# Patient Record
Sex: Female | Born: 1960 | Race: White | Hispanic: No | Marital: Married | Smoking: Never smoker
Health system: Southern US, Community
[De-identification: ages and names within clinical notes are randomized; demographics above are authoritative.]

## PROBLEM LIST (undated history)

## (undated) DIAGNOSIS — R109 Unspecified abdominal pain: Secondary | ICD-10-CM

## (undated) DIAGNOSIS — Z9889 Other specified postprocedural states: Secondary | ICD-10-CM

## (undated) DIAGNOSIS — K589 Irritable bowel syndrome without diarrhea: Secondary | ICD-10-CM

## (undated) DIAGNOSIS — E785 Hyperlipidemia, unspecified: Secondary | ICD-10-CM

## (undated) DIAGNOSIS — E119 Type 2 diabetes mellitus without complications: Secondary | ICD-10-CM

## (undated) HISTORY — DX: Hyperlipidemia, unspecified: E78.5

## (undated) HISTORY — PX: CERVICAL DISC SURGERY: SHX588

## (undated) HISTORY — PX: CHOLECYSTECTOMY: SHX55

## (undated) HISTORY — PX: OTHER SURGICAL HISTORY: SHX169

## (undated) HISTORY — PX: KNEE SURGERY: SHX244

## (undated) HISTORY — DX: Type 2 diabetes mellitus without complications: E11.9

## (undated) HISTORY — DX: Unspecified abdominal pain: R10.9

## (undated) HISTORY — PX: ANKLE SURGERY: SHX546

## (undated) HISTORY — PX: CARDIAC CATHETERIZATION: SHX172

## (undated) HISTORY — PX: TUBAL LIGATION: SHX77

## (undated) HISTORY — DX: Irritable bowel syndrome, unspecified: K58.9

---

## 1998-11-11 ENCOUNTER — Emergency Department (HOSPITAL_COMMUNITY): Admission: EM | Admit: 1998-11-11 | Discharge: 1998-11-12 | Payer: Self-pay | Admitting: Emergency Medicine

## 1998-11-12 ENCOUNTER — Encounter: Payer: Self-pay | Admitting: Emergency Medicine

## 1998-12-28 ENCOUNTER — Ambulatory Visit (HOSPITAL_COMMUNITY): Admission: RE | Admit: 1998-12-28 | Discharge: 1998-12-28 | Payer: Self-pay | Admitting: *Deleted

## 1999-04-19 ENCOUNTER — Emergency Department (HOSPITAL_COMMUNITY): Admission: EM | Admit: 1999-04-19 | Discharge: 1999-04-19 | Payer: Self-pay | Admitting: Emergency Medicine

## 1999-08-16 ENCOUNTER — Encounter: Admission: RE | Admit: 1999-08-16 | Discharge: 1999-09-19 | Payer: Self-pay | Admitting: Occupational Medicine

## 1999-10-09 ENCOUNTER — Emergency Department (HOSPITAL_COMMUNITY): Admission: EM | Admit: 1999-10-09 | Discharge: 1999-10-09 | Payer: Self-pay | Admitting: Emergency Medicine

## 2000-02-05 ENCOUNTER — Emergency Department (HOSPITAL_COMMUNITY): Admission: EM | Admit: 2000-02-05 | Discharge: 2000-02-05 | Payer: Self-pay | Admitting: *Deleted

## 2000-10-19 ENCOUNTER — Encounter: Payer: Self-pay | Admitting: Internal Medicine

## 2000-10-19 ENCOUNTER — Ambulatory Visit (HOSPITAL_COMMUNITY): Admission: RE | Admit: 2000-10-19 | Discharge: 2000-10-19 | Payer: Self-pay | Admitting: Internal Medicine

## 2000-10-23 ENCOUNTER — Encounter: Payer: Self-pay | Admitting: Internal Medicine

## 2000-10-23 ENCOUNTER — Ambulatory Visit (HOSPITAL_COMMUNITY): Admission: RE | Admit: 2000-10-23 | Discharge: 2000-10-23 | Payer: Self-pay | Admitting: Internal Medicine

## 2000-10-30 ENCOUNTER — Encounter: Admission: RE | Admit: 2000-10-30 | Discharge: 2000-12-14 | Payer: Self-pay | Admitting: Internal Medicine

## 2000-12-28 ENCOUNTER — Ambulatory Visit (HOSPITAL_COMMUNITY): Admission: RE | Admit: 2000-12-28 | Discharge: 2000-12-28 | Payer: Self-pay | Admitting: Neurosurgery

## 2000-12-28 ENCOUNTER — Encounter: Payer: Self-pay | Admitting: Neurosurgery

## 2001-04-02 ENCOUNTER — Ambulatory Visit (HOSPITAL_COMMUNITY): Admission: RE | Admit: 2001-04-02 | Discharge: 2001-04-02 | Payer: Self-pay | Admitting: Neurosurgery

## 2001-04-02 ENCOUNTER — Encounter: Payer: Self-pay | Admitting: Neurosurgery

## 2001-08-24 ENCOUNTER — Encounter: Payer: Self-pay | Admitting: Internal Medicine

## 2001-08-24 ENCOUNTER — Ambulatory Visit (HOSPITAL_COMMUNITY): Admission: RE | Admit: 2001-08-24 | Discharge: 2001-08-24 | Payer: Self-pay | Admitting: Internal Medicine

## 2001-11-04 ENCOUNTER — Ambulatory Visit (HOSPITAL_COMMUNITY): Admission: RE | Admit: 2001-11-04 | Discharge: 2001-11-04 | Payer: Self-pay | Admitting: Neurological Surgery

## 2001-11-04 ENCOUNTER — Encounter: Payer: Self-pay | Admitting: Neurological Surgery

## 2001-12-09 ENCOUNTER — Other Ambulatory Visit: Admission: RE | Admit: 2001-12-09 | Discharge: 2001-12-09 | Payer: Self-pay | Admitting: Unknown Physician Specialty

## 2001-12-11 ENCOUNTER — Emergency Department (HOSPITAL_COMMUNITY): Admission: EM | Admit: 2001-12-11 | Discharge: 2001-12-12 | Payer: Self-pay | Admitting: *Deleted

## 2002-03-02 ENCOUNTER — Encounter: Payer: Self-pay | Admitting: Neurological Surgery

## 2002-03-03 ENCOUNTER — Ambulatory Visit (HOSPITAL_COMMUNITY): Admission: RE | Admit: 2002-03-03 | Discharge: 2002-03-04 | Payer: Self-pay | Admitting: Neurological Surgery

## 2002-03-03 ENCOUNTER — Encounter: Payer: Self-pay | Admitting: Neurological Surgery

## 2002-04-28 ENCOUNTER — Encounter: Admission: RE | Admit: 2002-04-28 | Discharge: 2002-07-05 | Payer: Self-pay | Admitting: Neurological Surgery

## 2002-06-16 ENCOUNTER — Encounter: Admission: RE | Admit: 2002-06-16 | Discharge: 2002-06-16 | Payer: Self-pay | Admitting: Occupational Medicine

## 2002-06-16 ENCOUNTER — Encounter: Payer: Self-pay | Admitting: Occupational Medicine

## 2002-07-13 ENCOUNTER — Encounter: Admission: RE | Admit: 2002-07-13 | Discharge: 2002-09-26 | Payer: Self-pay | Admitting: Occupational Medicine

## 2002-08-08 ENCOUNTER — Encounter: Admission: RE | Admit: 2002-08-08 | Discharge: 2002-08-08 | Payer: Self-pay | Admitting: Occupational Medicine

## 2002-08-08 ENCOUNTER — Encounter: Payer: Self-pay | Admitting: Occupational Medicine

## 2002-09-23 ENCOUNTER — Ambulatory Visit (HOSPITAL_BASED_OUTPATIENT_CLINIC_OR_DEPARTMENT_OTHER): Admission: RE | Admit: 2002-09-23 | Discharge: 2002-09-24 | Payer: Self-pay | Admitting: Orthopedic Surgery

## 2002-10-31 ENCOUNTER — Encounter: Admission: RE | Admit: 2002-10-31 | Discharge: 2002-12-27 | Payer: Self-pay | Admitting: Orthopedic Surgery

## 2002-12-23 ENCOUNTER — Ambulatory Visit (HOSPITAL_BASED_OUTPATIENT_CLINIC_OR_DEPARTMENT_OTHER): Admission: RE | Admit: 2002-12-23 | Discharge: 2002-12-23 | Payer: Self-pay | Admitting: Orthopedic Surgery

## 2003-01-03 ENCOUNTER — Encounter: Admission: RE | Admit: 2003-01-03 | Discharge: 2003-02-16 | Payer: Self-pay | Admitting: Orthopedic Surgery

## 2003-01-30 ENCOUNTER — Emergency Department (HOSPITAL_COMMUNITY): Admission: EM | Admit: 2003-01-30 | Discharge: 2003-01-30 | Payer: Self-pay | Admitting: Emergency Medicine

## 2003-01-31 ENCOUNTER — Encounter: Payer: Self-pay | Admitting: Emergency Medicine

## 2003-01-31 ENCOUNTER — Ambulatory Visit (HOSPITAL_COMMUNITY): Admission: RE | Admit: 2003-01-31 | Discharge: 2003-01-31 | Payer: Self-pay | Admitting: Emergency Medicine

## 2003-05-04 ENCOUNTER — Encounter: Payer: Self-pay | Admitting: Internal Medicine

## 2003-05-04 ENCOUNTER — Ambulatory Visit (HOSPITAL_COMMUNITY): Admission: RE | Admit: 2003-05-04 | Discharge: 2003-05-04 | Payer: Self-pay | Admitting: Internal Medicine

## 2003-10-17 ENCOUNTER — Encounter: Admission: RE | Admit: 2003-10-17 | Discharge: 2004-01-15 | Payer: Self-pay | Admitting: Orthopedic Surgery

## 2003-11-06 ENCOUNTER — Ambulatory Visit (HOSPITAL_COMMUNITY): Admission: RE | Admit: 2003-11-06 | Discharge: 2003-11-06 | Payer: Self-pay | Admitting: Orthopedic Surgery

## 2004-01-23 ENCOUNTER — Emergency Department (HOSPITAL_COMMUNITY): Admission: EM | Admit: 2004-01-23 | Discharge: 2004-01-23 | Payer: Self-pay | Admitting: Emergency Medicine

## 2004-02-02 ENCOUNTER — Encounter: Admission: RE | Admit: 2004-02-02 | Discharge: 2004-05-02 | Payer: Self-pay | Admitting: Orthopedic Surgery

## 2005-04-22 ENCOUNTER — Emergency Department (HOSPITAL_COMMUNITY): Admission: EM | Admit: 2005-04-22 | Discharge: 2005-04-22 | Payer: Self-pay | Admitting: Emergency Medicine

## 2007-05-19 ENCOUNTER — Encounter: Admission: RE | Admit: 2007-05-19 | Discharge: 2007-05-19 | Payer: Self-pay | Admitting: Neurological Surgery

## 2007-09-21 ENCOUNTER — Encounter: Admission: RE | Admit: 2007-09-21 | Discharge: 2007-10-19 | Payer: Self-pay | Admitting: Orthopedic Surgery

## 2007-10-06 ENCOUNTER — Ambulatory Visit: Payer: Self-pay | Admitting: Vascular Surgery

## 2007-10-06 ENCOUNTER — Encounter (INDEPENDENT_AMBULATORY_CARE_PROVIDER_SITE_OTHER): Payer: Self-pay | Admitting: Orthopedic Surgery

## 2007-10-06 ENCOUNTER — Ambulatory Visit (HOSPITAL_COMMUNITY): Admission: RE | Admit: 2007-10-06 | Discharge: 2007-10-06 | Payer: Self-pay | Admitting: Orthopedic Surgery

## 2007-11-02 ENCOUNTER — Ambulatory Visit: Admission: RE | Admit: 2007-11-02 | Discharge: 2007-11-02 | Payer: Self-pay | Admitting: Family Medicine

## 2007-11-02 ENCOUNTER — Ambulatory Visit: Payer: Self-pay | Admitting: Vascular Surgery

## 2007-11-02 ENCOUNTER — Encounter: Payer: Self-pay | Admitting: Family Medicine

## 2007-12-22 ENCOUNTER — Ambulatory Visit: Payer: Self-pay | Admitting: Cardiology

## 2008-01-03 ENCOUNTER — Ambulatory Visit: Payer: Self-pay

## 2008-06-21 ENCOUNTER — Encounter: Admission: RE | Admit: 2008-06-21 | Discharge: 2008-06-21 | Payer: Self-pay | Admitting: Orthopedic Surgery

## 2008-07-01 ENCOUNTER — Encounter: Admission: RE | Admit: 2008-07-01 | Discharge: 2008-07-01 | Payer: Self-pay | Admitting: Orthopedic Surgery

## 2008-09-12 ENCOUNTER — Encounter: Admission: RE | Admit: 2008-09-12 | Discharge: 2008-09-12 | Payer: Self-pay | Admitting: Family Medicine

## 2009-09-10 ENCOUNTER — Emergency Department (HOSPITAL_COMMUNITY): Admission: EM | Admit: 2009-09-10 | Discharge: 2009-09-10 | Payer: Self-pay | Admitting: Emergency Medicine

## 2010-12-04 ENCOUNTER — Other Ambulatory Visit: Payer: Self-pay | Admitting: Neurological Surgery

## 2010-12-04 DIAGNOSIS — M542 Cervicalgia: Secondary | ICD-10-CM

## 2010-12-13 ENCOUNTER — Ambulatory Visit
Admission: RE | Admit: 2010-12-13 | Discharge: 2010-12-13 | Disposition: A | Discharge status: Home / Self Care | Payer: Medicare Other | Source: Ambulatory Visit | Attending: Neurological Surgery | Admitting: Neurological Surgery

## 2010-12-13 DIAGNOSIS — M542 Cervicalgia: Secondary | ICD-10-CM

## 2010-12-17 NOTE — Assessment & Plan Note (Signed)
[facility]                            CARDIOLOGY OFFICE NOTE   MAHITHA, HICKLING                     MRN:          [id number]  DATE:12/22/2007                            DOB:          1960/08/19    PRIMARY CARE PHYSICIAN:  Lindaann Pascal, PA, [facility]   REASON PRESENTATION:  Evaluate patient with chest discomfort.   HISTORY OF PRESENT ILLNESS:  The patient is a 50 year old white female  with past cardiac catheterization in [place] in 1995.  She had a  stress test for evaluation of chest pain.  I do not know the results,  but it must have been abnormal as she subsequently had a cardiac  catheterization.  She was told she had clean coronaries.  However, she  had recurrent chest discomfort 2 weeks ago.  She describes episodes of  discomfort, particularly with stress such as emotional stress.  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  It is substernal.  It may be 3-4/10 in  intensity.  The most was 7/10.  It only lasts for 30 seconds usually.  She has had it up to a minute.  It goes away when she calms down or when  she stops what she is doing.  It is somewhat sharp.  It is a little  tender to touch.  She thinks it is similar to the previous discomfort  she had.  It does not happen every day, particularly not if she is  emotionally or physically stressed.  She has been getting more short of  breath for the last 2 weeks.  She notices this with activity, but is not  describing any resting shortness of breath and has had no PND or  orthopnea.  When she get the above discomfort, she has no associated  nausea, vomiting, or diaphoresis.  There is no radiation.  She did have  an EKG that demonstrated no acute findings.   PAST MEDICAL HISTORY:  She denies any history of hypertension, diabetes  or hyperlipidemia.   PAST SURGICAL HISTORY:  1. Left knee  surgery. Arthroscopic.  2. Cervical diskectomy.   ALLERGIES:  None.   MEDICATIONS:  Vitamin D.   SOCIAL HISTORY:  The patient is a housewife.  She is married.  She has 4  children.  She is undergoing a lot of stress.  She does not smoke  cigarettes and never has. She does not drink alcohol.   FAMILY HISTORY:  Contributory for a brother dying suddenly of a  myocardial infarction at age 63.   REVIEW OF SYSTEMS:  As stated in the HPI, positive for occasional  palpitations.  Negative for all other systems.   PHYSICAL EXAMINATION:  GENERAL:  The patient is in no acute distress.  VITAL SIGNS:  Blood pressure 124/78, heart rate 99 and regular, weight  295 pounds, body mass index 43.  HEENT:  Eyelids are unremarkable, pupils equal, round, and react to  light, fundi within normal limits, oral mucosa unremarkable.  NECK:  No jugular  distention at 45 degrees, carotid upstroke brisk and  symmetrical.  No bruits, no thyromegaly.  LYMPHATICS:  No cervical, axillary, or inguinal adenopathy.  LUNGS:  Clear to auscultation bilaterally.  BACK:  No costovertebral angle tenderness.  CHEST:  Unremarkable.  HEART:  PMI not displaced or sustained, S1 and S2 within normal limits,  no S3, no S4.  No clicks, no rubs, no murmurs.  ABDOMEN:  Morbidly obese, positive bowel sounds, normal in frequency and  pitch, no bruits, no rebound, no guarding, no midline pulsatile mass.  No hepatomegaly, no splenomegaly.  SKIN:  No rashes, no nodules.  EXTREMITIES:  2+ pulses throughout, no edema, no cyanosis, no clubbing.  NEURO:  Oriented to person,  place, time, cranial nerves II-XII grossly  intact, motor grossly intact.   EKG sinus rhythm, rate 85, axis within normal limits, intervals within  normal limits, no acute ST-T wave changes.   ASSESSMENT/PLAN:  1. Chest:  The patient's chest comfort is worrisome for new onset      exertional angina.  She has a very significant family history for      obstructive  coronary disease.  I think the pretest probability of      obstructive coronary disease is at least moderate to moderately      high.  Therefore, I think stress testing is indicated.  The patient      might be able to walk on a treadmill.  We will do perfusion      imaging.  If she cannot ambulate to a target heart rate, she can be      converted to adenosine.  2. Obesity:  She understands that she needs to lose weight with diet      and exercise and we can review this further.  3. Risk reduction:  She needs a lipid profile.  I will defer to her      primary caregivers.  4. Low vitamin D.  This was recently diagnosed and she is getting this      supplemented.  5. Followup will be as needed based on results of the above testing.     Rollene Rotunda, MD, [facility]  Electronically Signed    JH/MedQ  DD: 12/22/2007  DT: 12/22/2007  Job #: [id number]   cc:   Lorin Picket, PA Long

## 2010-12-20 NOTE — Op Note (Signed)
   NAME:  Tina Sampson, Tina Sampson                        ACCOUNT NO.:  [id number]   MEDICAL RECORD NO.:  [id number]                   PATIENT TYPE:  AMB   LOCATION:  DSC                                  FACILITY:  [facility]   PHYSICIAN:  Thera Flake., M.D.             DATE OF BIRTH:  13-Jun-1961   DATE OF PROCEDURE:  09/23/2002  DATE OF DISCHARGE:                                 OPERATIVE REPORT   PREOPERATIVE DIAGNOSIS:  Unstable osteochondritic lesion, medial talar dome.   POSTOPERATIVE DIAGNOSIS:  Unstable osteochondritic lesion, medial talar  dome.   PROCEDURES:  1. Debridement with drilling of osteochondritis.  2. Synovectomy.   SURGEON:  Dyke Brackett, M.D.   ASSISTANT:  Jamelle Rushing, P.A.   ANESTHESIA:  General.   TOURNIQUET TIME:  Approximately 1 hour 10 minutes.   INDICATIONS:  A 50 year old, MRI-proven osteochondritis of her medial aspect  of her talar dome with persistent pain and catching, not responding to  conservative treatment.   DESCRIPTION OF PROCEDURE:  The patient was placed in a leg holder in supine  position on the _____.  She was arthroscoped through an anterior, medial,  and lateral portal.  Systematic inspection of the ankle showed moderate  synovitis along the anterior and medial gutter, which was debrided.  The  lateral gutter by and large was intact.  There was an unstable lesion of the  medial talar dome, not amenable to fixation.  It was debrided free of any  loose articular cartilage and then using the microfracture awls, several  trephine holes made in the lesion.  The tip of the awl, despite great care  being made to use the instrument as indicated, did crack off in the joint;  however, it was retrieved and documented with OEC as well as retrieving the  piece of metal outside of the ankle that this was removed.  This created no  undue problems relative to the procedure.  At the end the lesion was  essentially drilled.  A lightly compressive  sterile dressing was applied  after the portals were closed with nylon.  The portals were infiltrated with  Marcaine with the addition of about 10 mL into the joint.  Placed in a  posterior plaster splint.  Taken to the recovery room in stable condition.                                               Thera Flake., M.D.    WDC/MEDQ  D:  09/23/2002  T:  09/23/2002  Job:  [id number]
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
## 2010-12-20 NOTE — Op Note (Signed)
   NAME:  Tina Sampson, Tina Sampson                        ACCOUNT NO.:  [id number]   MEDICAL RECORD NO.:  [id number]                   PATIENT TYPE:  AMB   LOCATION:  DSC                                  FACILITY:  [facility]   PHYSICIAN:  Thera Flake., M.D.             DATE OF BIRTH:  Feb 28, 1961   DATE OF PROCEDURE:  DATE OF DISCHARGE:                                 OPERATIVE REPORT   INDICATIONS:  A 50 year old with left knee pain after a knee injury, work  related, not responding to conservative treatment, thought to be amenable to  outpatient arthroscopy.   PREOPERATIVE DIAGNOSES:  1. Torn anterior horn lateral meniscus left knee.  2. Chondromalacia of the patellofemoral joint.  3. 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   POSTOPERATIVE DIAGNOSES:  1. Torn anterior horn lateral meniscus left knee.  2. Chondromalacia of the patellofemoral joint.  3. 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   PROCEDURE:  1. Partial lateral meniscectomy.  2. Debridement and chondroplasty of the patellofemoral joint medial     compartment.   SURGEON:  Dyke Brackett, M.D.   ANESTHESIA:  General.   DESCRIPTION OF PROCEDURE:  After introducing the scope through an  inferomedial and through a lateral portal, systematic inspection of the knee  showed the patient to have grade 3 chondromalacia of the medial femoral  condyle and less severe changes on the tibia.  The anterior horn and lateral  meniscus was torn and was debrided.  There were early degenerative changes  on the lateral.  There was a chondral injury to the medial femoral condyle.  It was debrided; patellofemoral changes, grade 3, particularly on the  patella which were debrided.   Debridement of the patellofemoral joint, medial compartment was carried out  separate from the lateral meniscectomy; lateral meniscus approximately 10-  15% of the meniscus was saved.  There were no grade 4 changes appreciated.  The knee  drained clear fluid.  Ports were closed with Nylon.  Knee  infiltrated Marcaine and morphine with addition of 40 mg of Depo-Medrol into  the joint for a knee block with Marcaine, and the additional total 30 cc of  1/2% Marcaine was used.  Taken to the recovery room in stable condition.                                               Thera Flake., M.D.    WDC/MEDQ  D:  12/23/2002  T:  12/24/2002  Job:  [id number]

## 2010-12-23 ENCOUNTER — Other Ambulatory Visit: Payer: Self-pay | Admitting: Neurological Surgery

## 2010-12-23 DIAGNOSIS — M542 Cervicalgia: Secondary | ICD-10-CM

## 2010-12-27 ENCOUNTER — Ambulatory Visit
Admission: RE | Admit: 2010-12-27 | Discharge: 2010-12-27 | Disposition: A | Discharge status: Home / Self Care | Payer: Medicare Other | Source: Ambulatory Visit | Attending: Neurological Surgery | Admitting: Neurological Surgery

## 2010-12-27 DIAGNOSIS — M542 Cervicalgia: Secondary | ICD-10-CM

## 2010-12-31 ENCOUNTER — Ambulatory Visit: Payer: Medicare PPO | Attending: Neurological Surgery | Admitting: Physical Therapy

## 2010-12-31 DIAGNOSIS — M542 Cervicalgia: Secondary | ICD-10-CM | POA: Insufficient documentation

## 2010-12-31 DIAGNOSIS — IMO0001 Reserved for inherently not codable concepts without codable children: Secondary | ICD-10-CM | POA: Insufficient documentation

## 2010-12-31 DIAGNOSIS — R5381 Other malaise: Secondary | ICD-10-CM | POA: Insufficient documentation

## 2011-01-02 ENCOUNTER — Ambulatory Visit: Payer: Medicare PPO | Admitting: Physical Therapy

## 2011-01-06 ENCOUNTER — Encounter: Payer: Medicare Other | Admitting: Physical Therapy

## 2011-01-09 ENCOUNTER — Ambulatory Visit: Payer: Medicare PPO | Attending: Neurological Surgery | Admitting: Physical Therapy

## 2011-01-09 DIAGNOSIS — IMO0001 Reserved for inherently not codable concepts without codable children: Secondary | ICD-10-CM | POA: Insufficient documentation

## 2011-01-09 DIAGNOSIS — M542 Cervicalgia: Secondary | ICD-10-CM | POA: Insufficient documentation

## 2011-01-09 DIAGNOSIS — R5381 Other malaise: Secondary | ICD-10-CM | POA: Insufficient documentation

## 2011-01-13 ENCOUNTER — Ambulatory Visit: Payer: Medicare PPO

## 2011-01-16 ENCOUNTER — Encounter: Payer: Medicare Other | Admitting: Physical Therapy

## 2011-01-20 ENCOUNTER — Ambulatory Visit: Payer: Medicare PPO | Admitting: Physical Therapy

## 2011-01-23 ENCOUNTER — Ambulatory Visit: Payer: Medicare PPO | Admitting: Physical Therapy

## 2011-01-28 ENCOUNTER — Ambulatory Visit: Payer: Medicare PPO | Admitting: Physical Therapy

## 2011-05-15 ENCOUNTER — Encounter: Payer: Self-pay | Admitting: *Deleted

## 2011-05-15 ENCOUNTER — Emergency Department (HOSPITAL_COMMUNITY)
Admission: EM | Admit: 2011-05-15 | Discharge: 2011-05-15 | Disposition: A | Discharge status: Home / Self Care | Payer: Medicare PPO | Attending: Emergency Medicine | Admitting: Emergency Medicine

## 2011-05-15 ENCOUNTER — Emergency Department (HOSPITAL_COMMUNITY): Payer: Medicare PPO

## 2011-05-15 DIAGNOSIS — S4980XA Other specified injuries of shoulder and upper arm, unspecified arm, initial encounter: Secondary | ICD-10-CM | POA: Insufficient documentation

## 2011-05-15 DIAGNOSIS — X500XXA Overexertion from strenuous movement or load, initial encounter: Secondary | ICD-10-CM | POA: Insufficient documentation

## 2011-05-15 DIAGNOSIS — S46009A Unspecified injury of muscle(s) and tendon(s) of the rotator cuff of unspecified shoulder, initial encounter: Secondary | ICD-10-CM

## 2011-05-15 DIAGNOSIS — M79609 Pain in unspecified limb: Secondary | ICD-10-CM | POA: Insufficient documentation

## 2011-05-15 DIAGNOSIS — S46909A Unspecified injury of unspecified muscle, fascia and tendon at shoulder and upper arm level, unspecified arm, initial encounter: Secondary | ICD-10-CM | POA: Insufficient documentation

## 2011-05-15 DIAGNOSIS — M542 Cervicalgia: Secondary | ICD-10-CM | POA: Insufficient documentation

## 2011-05-15 DIAGNOSIS — E119 Type 2 diabetes mellitus without complications: Secondary | ICD-10-CM | POA: Insufficient documentation

## 2011-05-15 MED ORDER — NAPROXEN 500 MG PO TABS: 500.0000 mg | ORAL_TABLET | Freq: Two times a day (BID) | ORAL | Status: AC

## 2011-05-15 MED ORDER — HYDROCODONE-ACETAMINOPHEN 5-325 MG PO TABS: 1.0000 | ORAL_TABLET | ORAL | Status: AC | PRN

## 2011-05-15 MED ORDER — HYDROCODONE-ACETAMINOPHEN 5-325 MG PO TABS
1.0000 | ORAL_TABLET | Freq: Once | ORAL | Status: AC
  Administered 2011-05-15: 1 via ORAL
  Filled 2011-05-15: qty 1
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
## 2011-05-15 NOTE — ED Notes (Signed)
Helping to lift a lawn mower,  Shifter and twisted rt arm, pain  Entire arm and shoulder. With numbness

## 2012-06-13 ENCOUNTER — Emergency Department (HOSPITAL_COMMUNITY): Payer: Medicare PPO

## 2012-06-13 ENCOUNTER — Emergency Department (HOSPITAL_COMMUNITY)
Admission: EM | Admit: 2012-06-13 | Discharge: 2012-06-13 | Disposition: A | Discharge status: Home / Self Care | Payer: Medicare PPO | Attending: Emergency Medicine | Admitting: Emergency Medicine

## 2012-06-13 ENCOUNTER — Encounter (HOSPITAL_COMMUNITY): Payer: Self-pay | Admitting: Emergency Medicine

## 2012-06-13 DIAGNOSIS — S63501A Unspecified sprain of right wrist, initial encounter: Secondary | ICD-10-CM

## 2012-06-13 DIAGNOSIS — S63509A Unspecified sprain of unspecified wrist, initial encounter: Secondary | ICD-10-CM | POA: Insufficient documentation

## 2012-06-13 DIAGNOSIS — E119 Type 2 diabetes mellitus without complications: Secondary | ICD-10-CM | POA: Insufficient documentation

## 2012-06-13 DIAGNOSIS — Y939 Activity, unspecified: Secondary | ICD-10-CM | POA: Insufficient documentation

## 2012-06-13 DIAGNOSIS — W010XXA Fall on same level from slipping, tripping and stumbling without subsequent striking against object, initial encounter: Secondary | ICD-10-CM | POA: Insufficient documentation

## 2012-06-13 DIAGNOSIS — Y92009 Unspecified place in unspecified non-institutional (private) residence as the place of occurrence of the external cause: Secondary | ICD-10-CM | POA: Insufficient documentation

## 2012-06-13 DIAGNOSIS — S20219A Contusion of unspecified front wall of thorax, initial encounter: Secondary | ICD-10-CM

## 2012-06-13 HISTORY — DX: Other specified postprocedural states: Z98.890

## 2012-06-13 MED ORDER — CYCLOBENZAPRINE HCL 10 MG PO TABS
10.0000 mg | ORAL_TABLET | Freq: Three times a day (TID) | ORAL | Status: DC | PRN
Start: 2012-06-13 — End: 2017-06-10

## 2012-06-13 MED ORDER — OXYCODONE-ACETAMINOPHEN 5-325 MG PO TABS
1.0000 | ORAL_TABLET | Freq: Once | ORAL | Status: AC
  Administered 2012-06-13: 1 via ORAL
  Filled 2012-06-13: qty 1

## 2012-06-13 MED ORDER — OXYCODONE-ACETAMINOPHEN 5-325 MG PO TABS: 1.0000 | ORAL_TABLET | ORAL | Status: AC | PRN

## 2012-06-13 NOTE — ED Notes (Signed)
Patient with c/o entire right side pain from fall. Patient states she tripped and landed on her right side. Pain increases with respirations.

## 2012-06-13 NOTE — ED Provider Notes (Signed)
History     CSN: [id number]  Arrival date & time 06/13/12  1750   First MD Initiated Contact with Patient 06/13/12 1928      Chief Complaint  Patient presents with  . Fall    (Consider location/radiation/quality/duration/timing/severity/associated sxs/prior treatment) HPI Comments: Patient c/o pain to her right lateral ribs and right wrist after she tripped and fell inside her home.  States pain is worse with palpation and certain movements, improves with rest.  Wrist pain is worse with gripping and flexion.  She denies abd pain, shortness of breath, diaphoresis, neck or low back pain.  Also denies head injury , headaches, or LOC  Patient is a 51 y.o. female presenting with chest pain. The history is provided by the patient.  Chest Pain Episode onset: several hrs PTA. Chest pain occurs constantly. The chest pain is unchanged. The pain is associated with breathing (movemetn of right arm, palpation of the latral chest wall). The severity of the pain is moderate. The quality of the pain is described as aching. The pain does not radiate. Chest pain is worsened by certain positions and deep breathing. Pertinent negatives for primary symptoms include no fever, no fatigue, no syncope, no shortness of breath, no cough, no wheezing, no palpitations, no abdominal pain, no nausea, no vomiting, no dizziness and no altered mental status.  Pertinent negatives for associated symptoms include no numbness and no weakness. She tried nothing for the symptoms.     Past Medical History  Diagnosis Date  . Diabetes mellitus   . H/O knee surgery     Past Surgical History  Procedure Date  . Cervical disc surgery   . Cholecystectomy   . Cesarean section   . Knee surgery   . Catherization   . Knee surgery   . Tubal ligation   . Ankle surgery     Family History  Problem Relation Age of Onset  . Cancer Mother   . Heart failure Mother   . Hyperlipidemia Mother   . Diabetes Mother   .  Osteoarthritis Sister   . Heart failure Brother     History  Substance Use Topics  . Smoking status: Never Smoker   . Smokeless tobacco: Not on file  . Alcohol Use: No    OB History    Grav Para Term Preterm Abortions TAB SAB Ect Mult Living                  Review of Systems  Constitutional: Negative for fever, chills and fatigue.  HENT: Negative for neck pain.   Eyes: Negative for visual disturbance.  Respiratory: Negative for cough, chest tightness, shortness of breath and wheezing.   Cardiovascular: Positive for chest pain. Negative for palpitations and syncope.  Gastrointestinal: Negative for nausea, vomiting and abdominal pain.  Genitourinary: Negative for dysuria, hematuria, flank pain and difficulty urinating.  Musculoskeletal: Positive for arthralgias. Negative for back pain, joint swelling and gait problem.  Skin: Negative for color change and wound.  Neurological: Negative for dizziness, syncope, weakness, numbness and headaches.  Psychiatric/Behavioral: Negative for confusion and altered mental status.  All other systems reviewed and are negative.    Allergies  Bee venom  Home Medications   Current Outpatient Rx  Name  Route  Sig  Dispense  Refill  . DULOXETINE HCL 60 MG PO CPEP   Oral   Take 60 mg by mouth at bedtime.           Marland Kitchen GABAPENTIN 300 MG PO  CAPS   Oral   Take 600 mg by mouth 2 (two) times daily. Take one capsule every morning and evening, then take two capsules at bedtime         . METFORMIN HCL 500 MG PO TABS   Oral   Take 500 mg by mouth 2 (two) times daily with a meal.           . NAPROXEN 500 MG PO TABS   Oral   Take 500 mg by mouth 2 (two) times daily.         Marland Kitchen PRAMIPEXOLE DIHYDROCHLORIDE 0.125 MG PO TABS   Oral   Take 0.125 mg by mouth at bedtime. For RLS         . PRAVASTATIN SODIUM 40 MG PO TABS   Oral   Take 40 mg by mouth daily.         . TRAZODONE HCL 50 MG PO TABS   Oral   Take 50 mg by mouth at  bedtime.           BP 107/67  Pulse 81  Temp 98.3 F (36.8 C) (Oral)  Resp 19  Ht 5\' 9"  (1.753 m)  Wt 275 lb (124.739 kg)  BMI 40.61 kg/m2  SpO2 97%  LMP 03/19/2011  Physical Exam  Nursing note and vitals reviewed. Constitutional: She is oriented to person, place, and time. She appears well-developed and well-nourished. No distress.       Pt is well appearing, obese  HENT:  Head: Normocephalic and atraumatic.  Eyes: EOM are normal. Pupils are equal, round, and reactive to light.  Neck: Normal range of motion and phonation normal. Neck supple. No spinous process tenderness and no muscular tenderness present. Normal range of motion present.  Cardiovascular: Normal rate, regular rhythm, normal heart sounds and intact distal pulses.   No murmur heard. Pulmonary/Chest: Effort normal and breath sounds normal. No respiratory distress. She has no decreased breath sounds. She has no wheezes. She has no rales. She exhibits tenderness.         ttp of the lateral right chest wall.  No crepitus, abrasions of bruising.  Abdominal: Soft. She exhibits no distension and no mass. There is no tenderness. There is no rebound and no guarding.  Musculoskeletal: She exhibits tenderness. She exhibits no edema.       Right wrist: She exhibits tenderness and bony tenderness. She exhibits normal range of motion, no swelling, no effusion, no crepitus, no deformity and no laceration.       Arms:      right wrist is ttp .  Radial pulse is brisk, sensation intact.  CR< 2 sec.  No bruising or deformity.  Patient has full ROM.  Lymphadenopathy:    She has no cervical adenopathy.  Neurological: She is alert and oriented to person, place, and time. She exhibits normal muscle tone. Coordination normal.  Skin: Skin is warm and dry.    ED Course  Procedures (including critical care time)  Labs Reviewed - No data to display Dg Ribs Unilateral W/chest Right  06/13/2012  *RADIOLOGY REPORT*  Clinical Data:  Status post fall.  RIGHT RIBS AND CHEST - 3+ VIEW  Comparison: None  Findings: Normal heart size.  No pleural effusion or edema.  No airspace consolidation identified.  Dedicated views of the right ribs show no displaced rib fracture.  IMPRESSION: No acute findings.   Original Report Authenticated By: Signa Kell, M.D.    Dg Wrist Complete Right  06/13/2012  *  RADIOLOGY REPORT*  Clinical Data: Fall.  Pain over ulnar styloid.  RIGHT WRIST - COMPLETE 3+ VIEW  Comparison: None.  Findings: No fracture, foreign body, or acute bony findings are identified.  IMPRESSION:  No significant abnormality identified.   Original Report Authenticated By: Gaylyn Rong, M.D.         MDM    velcro wrist splint applied, pain improved remains NV intact   Pt has ttp of the lateral right chest wall, no bruising edema or crepitus.  Vitals stable.  Ambulates with a steady gait.    Pt agrees to f/u with her PMD for recheck or to return here if sx's worsen   Prescribed: Percocet  #20 flexeril  Sarin [place]un[place] L. Quasim Doyon, [place] 06/15/12 1527

## 2012-06-16 NOTE — ED Provider Notes (Signed)
Medical screening examination/treatment/procedure(s) were performed by non-physician practitioner and as supervising physician I was immediately available for consultation/collaboration.  Mayling Aber, MD 06/16/12 0704 

## 2014-09-12 ENCOUNTER — Ambulatory Visit: Payer: Medicare PPO | Attending: Neurological Surgery | Admitting: Physical Therapy

## 2014-09-12 DIAGNOSIS — M5412 Radiculopathy, cervical region: Secondary | ICD-10-CM | POA: Insufficient documentation

## 2014-09-12 DIAGNOSIS — E119 Type 2 diabetes mellitus without complications: Secondary | ICD-10-CM | POA: Diagnosis not present

## 2014-09-21 ENCOUNTER — Encounter: Payer: Self-pay | Admitting: *Deleted

## 2014-09-21 ENCOUNTER — Ambulatory Visit: Payer: Medicare PPO | Admitting: *Deleted

## 2014-09-21 DIAGNOSIS — M5412 Radiculopathy, cervical region: Secondary | ICD-10-CM | POA: Diagnosis not present

## 2014-09-21 DIAGNOSIS — M542 Cervicalgia: Secondary | ICD-10-CM

## 2014-09-21 NOTE — Therapy (Signed)
[facility] [place], [place], [address] Phone: [phone]   Fax:  [phone]  Physical Therapy Treatment  Patient Details  Name: Tina Sampson MRN: [id number] Date of Birth: January 24, 1961 Referring Provider:  Kristeen Miss, MD  Encounter Date: 09/21/2014    Past Medical History  Diagnosis Date  . Diabetes mellitus   . H/O knee surgery     Past Surgical History  Procedure Laterality Date  . Cervical disc surgery    . Cholecystectomy    . Cesarean section    . Knee surgery    . Catherization    . Knee surgery    . Tubal ligation    . Ankle surgery      LMP 03/19/2011  Visit Diagnosis:  Neck pain      Subjective Assessment - 09/21/14 1158    Symptoms Pt. did well after eval   Currently in Pain? Yes   Pain Location Neck   Pain Orientation Other (Comment);Left   Pain Descriptors / Indicators Dull;Aching   Aggravating Factors  ADL's   Pain Relieving Factors Heat,rest                    [facility] Adult PT Treatment/Exercise - 09/21/14 0001    Modalities   Modalities Ultrasound;Retail buyer Location Intructed Pt. in use of home tens unit to LT side cerv. paras and u-trap   Ultrasound   Ultrasound Location LT and RT u-trap and cerv.paras   Ultrasound Parameters 1.5 w/cm sq. x 10 min.s   Manual Therapy   Manual Therapy Myofascial release   Myofascial Release Instrument Ass. STW to LT and RT U-traps/levator and into cerv. paras.  TPR to LT u-trap and levator x14 mins                     PT Long Term Goals - 09/21/14 1219    PT LONG TERM GOAL #1   Title demonstrate or verbalize techniques to reduce the risk of re-injury to include info on posture   Status On-going   PT LONG TERM GOAL #2   Title be independent with HEP   Status On-going   PT LONG TERM GOAL #3   Title increase ROM for cervical rotation to 70 degrees   Status On-going   PT LONG TERM GOAL #4   Title perform ADL's with pain not >3/10   Status On-going   PT LONG TERM GOAL #5   Title eliminate headaches               Plan - 09/21/14 1211    Clinical Impression Statement Pt. did great with Rx. today and felt relief   PT Treatment/Interventions Moist Heat;Patient/family education;Therapeutic exercise;Ultrasound;Manual techniques;Electrical Stimulation   PT Next Visit Plan cont with Rx plan        Problem List There are no active problems to display for this patient.   Elmer Merwin,CHRIS PTA 09/21/2014, 12:37 PM  [facility] [address] [place], [place], [address] Phone: [phone]   Fax:  [phone]

## 2014-09-27 ENCOUNTER — Ambulatory Visit: Payer: Medicare PPO | Admitting: *Deleted

## 2014-09-27 ENCOUNTER — Encounter: Payer: Self-pay | Admitting: *Deleted

## 2014-09-27 DIAGNOSIS — M5412 Radiculopathy, cervical region: Secondary | ICD-10-CM | POA: Diagnosis not present

## 2014-09-27 DIAGNOSIS — M542 Cervicalgia: Secondary | ICD-10-CM

## 2014-09-27 NOTE — Therapy (Signed)
[facility] [place], [place], [address] Phone: [phone]   Fax:  [phone]  Physical Therapy Treatment  Patient Details  Name: Tina Sampson MRN: [id number] Date of Birth: Dec 07, 1960 Referring Provider:  Kristeen Miss, MD  Encounter Date: 09/27/2014      PT End of Session - 09/27/14 1231    Visit Number 3   Number of Visits 12   PT Start Time 1030   PT Stop Time 1126   PT Time Calculation (min) 56 min   Activity Tolerance Patient tolerated treatment well   Behavior During Therapy [facility] for tasks assessed/performed      Past Medical History  Diagnosis Date  . Diabetes mellitus   . H/O knee surgery     Past Surgical History  Procedure Laterality Date  . Cervical disc surgery    . Cholecystectomy    . Cesarean section    . Knee surgery    . Catherization    . Knee surgery    . Tubal ligation    . Ankle surgery      LMP 03/19/2011  Visit Diagnosis:  Neck pain      Subjective Assessment - 09/27/14 1203    Symptoms cervical upper trap pain and spasm. especially left.  Lifted wood and increased pain.   Currently in Pain? Yes   Pain Score 3    Pain Location Back   Pain Orientation Other (Comment);Left   Pain Descriptors / Indicators Tingling;Headache;Aching   Pain Type Chronic pain   Pain Onset 1 to 4 weeks ago   Pain Frequency Intermittent                    [facility] Adult PT Treatment/Exercise - 09/27/14 0001    Modalities   Modalities Moist Heat   Moist Heat Therapy   Number Minutes Moist Heat 15 Minutes   Moist Heat Location Other (comment)  Neck   Electrical Stimulation   Electrical Stimulation Location lt upper trap   Electrical Stimulation Action premod   Ultrasound   Ultrasound Location lt upper trap   Ultrasound Parameters 1.5 w/cm sq. x 10 min      Manual Therapy   Manual Therapy --   Other Manual Therapy stw, neuromuscular techniques, to lt neck and upper trap for 15  min                     PT Long Term Goals - 09/21/14 1219    PT LONG TERM GOAL #1   Title demonstrate or verbalize techniques to reduce the risk of re-injury to include info on posture   Status On-going   PT LONG TERM GOAL #2   Title be independent with HEP   Status On-going   PT LONG TERM GOAL #3   Title increase ROM for cervical rotation to 70 degrees   Status On-going   PT LONG TERM GOAL #4   Title perform ADL's with pain not >3/10   Status On-going   PT LONG TERM GOAL #5   Title eliminate headaches               Problem List There are no active problems to display for this patient.   Barbie Haggis, PTA 09/27/2014, 12:41 PM  [facility] [address] [place], [place], [address] Phone: [phone]   Fax:  [phone]

## 2014-10-04 ENCOUNTER — Ambulatory Visit: Payer: Medicare PPO | Admitting: Physical Therapy

## 2015-04-24 ENCOUNTER — Ambulatory Visit: Payer: Medicare HMO | Attending: Orthopedic Surgery | Admitting: Physical Therapy

## 2015-04-24 DIAGNOSIS — M25562 Pain in left knee: Secondary | ICD-10-CM | POA: Diagnosis present

## 2015-04-24 DIAGNOSIS — R609 Edema, unspecified: Secondary | ICD-10-CM | POA: Diagnosis present

## 2015-04-24 NOTE — Therapy (Signed)
[facility] [place], [place], [address] Phone: [phone]   Fax:  [phone]  Physical Therapy Evaluation  Patient Details  Name: Tina Sampson MRN: [id number] Date of Birth: 03-Aug-1961 Referring Provider:  Case, Reche Dixon, MD  Encounter Date: 04/24/2015      PT End of Session - 04/24/15 1250    Visit Number 1   Number of Visits 12   Date for PT Re-Evaluation 06/05/15   PT Start Time 3154   PT Stop Time 1131   PT Time Calculation (min) 50 min   Activity Tolerance Patient tolerated treatment well   Behavior During Therapy [facility] for tasks assessed/performed      Past Medical History  Diagnosis Date  . Diabetes mellitus   . H/O knee surgery     Past Surgical History  Procedure Laterality Date  . Cervical disc surgery    . Cholecystectomy    . Cesarean section    . Knee surgery    . Catherization    . Knee surgery    . Tubal ligation    . Ankle surgery      There were no vitals filed for this visit.  Visit Diagnosis:  Left knee pain - Plan: PT plan of care cert/re-cert  Edema - Plan: PT plan of care cert/re-cert      Subjective Assessment - 04/24/15 1538    Subjective My knee kneecp was popping out.   Limitations Walking   How long can you walk comfortably? 15 minutes.   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   Onset Date/Surgical Date --  04/06/15 (surgery date).   Precautions   Precautions --  No ultrasound.   Restrictions   Weight Bearing Restrictions No   Balance Screen   Has the patient fallen in the past 6 months No   Has the patient had a decrease in activity level because of a fear of falling?  No   Is the patient  reluctant to leave their home because of a fear of falling?  No   Home Environment   Living Environment Private residence   Prior Function   Level of Independence Independent   Observation/Other Assessments-Edema    Edema Circumferential   Circumferential Edema   Circumferential - Right Left 4 cms > right.   ROM / Strength   AROM / PROM / Strength AROM;Strength   AROM   Overall AROM Comments 0 to 123 degrees.   Strength   Overall Strength Comments Left quadriceps= 4/5. and left hip abduction and ER= 4/5.   Palpation   Patella mobility --  Hypermobility of left patella.   Palpation comment Tender around left knee incisional sites.   Ambulation/Gait   Gait Comments Nearly normal gait cycle.                   [facility] Adult PT Treatment/Exercise - 04/24/15 0001    Modalities   Modalities Vasopneumatic   Vasopneumatic   Number Minutes Vasopneumatic  15 minutes   Vasopnuematic Location  --  Left knee.   Vasopneumatic Pressure Medium  PT Short Term Goals - Apr 26, 2015 1603    PT SHORT TERM GOAL #1   Title Ind with HEP.   Time 2   Period Weeks   Status New           PT Long Term Goals - 04/26/2015 1603    PT LONG TERM GOAL #1   Title No complaints of left patellla "popping out."   Time 4   Period Weeks   Status New   PT LONG TERM GOAL #2   Title 5/5 left knee and hip strength to increase stability for functional tasks.   Time 4   Period Weeks   Status New   PT LONG TERM GOAL #3   Title Walk a community distance wiht pain not > 3/10.               Plan - 26-Apr-2015 1547    Clinical Impression Statement The patient underwent a left total knee replacement.  She reports that her knee "popped a lot" and disolcate dgfrequnetly.  She underwent a lateral release and debridement of adhesions.  She states her pain ranges from 3 to 8/10.     Pt will benefit from skilled therapeutic intervention in order to improve on the following  deficits Pain;Decreased activity tolerance;Decreased strength   Rehab Potential Excellent   PT Frequency 3x / week   PT Duration 8 weeks   PT Treatment/Interventions ADLs/Self Care Home Management;Cryotherapy;Electrical Stimulation;Manual techniques;Therapeutic activities;Therapeutic exercise;Vasopneumatic Device;Neuromuscular re-education;Patient/family education   PT Next Visit Plan Left knee strengthening especially quadriceps; hip abduction and ER. Vasopneumatic.  VMO strengthening.   Consulted and Agree with Plan of Care Patient          G-Codes - 04-26-2015 1602    Functional Assessment Tool Used FOTO.   Functional Limitation Mobility: Walking and moving around   Mobility: Walking and Moving Around Current Status [phone]) At least 40 percent but less than 60 percent impaired, limited or restricted   Mobility: Walking and Moving Around Goal Status [phone]) At least 20 percent but less than 40 percent impaired, limited or restricted       Problem List There are no active problems to display for this patient.   APPLEGATE, [place] MPT 26-Apr-2015, 4:06 PM  [facility] [address] [place], [place], [address] Phone: [phone]   Fax:  [phone]

## 2015-04-25 ENCOUNTER — Encounter: Payer: Self-pay | Admitting: Physical Therapy

## 2015-04-25 ENCOUNTER — Ambulatory Visit: Payer: Medicare HMO | Admitting: Physical Therapy

## 2015-04-25 DIAGNOSIS — R609 Edema, unspecified: Secondary | ICD-10-CM

## 2015-04-25 DIAGNOSIS — M25562 Pain in left knee: Secondary | ICD-10-CM

## 2015-04-25 NOTE — Patient Instructions (Signed)
Strengthening: Straight Leg Raise (Phase 1)   Tighten muscles on front of right thigh, then lift leg _5___ inches from surface, keeping knee locked.  Repeat _10___ times per set. Do __3__ sets per session. Do __3__ sessions per day.  http://orth.exer.us/614   Copyright  VHI. All rights reserved.  Straight Leg Raise: With External Leg Rotation   Lie on back with left leg straight, opposite leg bent. Rotate straight leg out and lift __5__ inches. Repeat _10___ times per set. Do _3___ sets per session. Do __3__ sessions per day.  http://orth.exer.us/728   Copyright  VHI. All rights reserved.  Strengthening: Hip Abduction (Side-Lying)   Tighten muscles on front of left thigh, then lift leg _5___ inches from surface, keeping knee locked.  Repeat __10__ times per set. Do __3__ sets per session. Do __3__ sessions per day.  http://orth.exer.us/622   Copyright  VHI. All rights reserved.

## 2015-04-25 NOTE — Therapy (Addendum)
[facility] [place], [place], [address] Phone: [phone]   Fax:  [phone]  Physical Therapy Treatment  Patient Details  Name: Tina Sampson MRN: [id number] Date of Birth: 06/29/1961 Referring Provider:  Case, Reche Dixon, MD  Encounter Date: 04/25/2015      PT End of Session - 04/25/15 1433    Visit Number 2   Number of Visits 12   Date for PT Re-Evaluation 06/05/15   PT Start Time 1450   PT Stop Time 1533   PT Time Calculation (min) 43 min   Activity Tolerance Patient tolerated treatment well   Behavior During Therapy [facility] for tasks assessed/performed      Past Medical History  Diagnosis Date  . Diabetes mellitus   . H/O knee surgery     Past Surgical History  Procedure Laterality Date  . Cervical disc surgery    . Cholecystectomy    . Cesarean section    . Knee surgery    . Catherization    . Knee surgery    . Tubal ligation    . Ankle surgery      There were no vitals filed for this visit.  Visit Diagnosis:  Left knee pain  Edema      Subjective Assessment - 04/25/15 1431    Subjective Reports walking 1/4 mile today nonstop and only had pain in L hip although pain returned in L knee. Moves L knee medially per MD orders per patient report.    Limitations Walking   How long can you walk comfortably? 15 minutes.   Currently in Pain? 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   Onset Date/Surgical Date 04/06/15                     [facility] Adult PT Treatment/Exercise - 04/25/15 0001    Exercises   Exercises Knee/Hip   Knee/Hip Exercises: Aerobic   Stationary Bike X15 MIN   Knee/Hip Exercises: Standing   Heel  Raises Both;20 reps   Rocker Board 4 minutes   Knee/Hip Exercises: Supine   Short Arc Quad Sets Strengthening;Left;1 set;15 reps;Other (comment)  10 sec hold   Straight Leg Raises Strengthening;Left;3 sets;10 reps   Straight Leg Raise with External Rotation Strengthening;Left;3 sets;10 reps   Knee/Hip Exercises: Sidelying   Hip ABduction Strengthening;Left;3 sets;10 reps   Clams X30 reps L   Modalities   Modalities Vasopneumatic   Vasopneumatic   Number Minutes Vasopneumatic  15 minutes   Vasopnuematic Location  Knee   Vasopneumatic Pressure Medium   Vasopneumatic Temperature  34                PT Education - 04/25/15 1521    Education provided Yes   Education Details HEP- SLR, SLR with ER, hip abduction; educated to ice for 20 minutes max several times a day   Person(s) Educated Patient   Methods Explanation;Demonstration;Verbal cues;Handout;Tactile cues   Comprehension Verbalized understanding;Returned demonstration;Verbal cues required;Tactile cues required          PT Short Term Goals - 04/24/15 1603    PT SHORT TERM  GOAL #1   Title Ind with HEP.   Time 2   Period Weeks   Status New           PT Long Term Goals - 2015-05-02 1603    PT LONG TERM GOAL #1   Title No complaints of left patellla "popping out."   Time 4   Period Weeks   Status New   PT LONG TERM GOAL #2   Title 5/5 left knee and hip strength to increase stability for functional tasks.   Time 4   Period Weeks   Status New   PT LONG TERM GOAL #3   Title Walk a community distance wiht pain not > 3/10.               Plan - 04/25/15 1514    Clinical Impression Statement Patient tolerated treatment fairly well although she experienced pain with L SAQ medially. Completed all exercises well with moderate multimodal cueing for proper technique and explanation for purpose and relativity to patient's condition. Normal integumentary response to vasopneumatic system following removal of the  system. Following removal of the vasopneumatic system increased inflammation was observed over the superiolateral aspect of the L knee. Accepted new HEP without questions and patient was advised to ice L knee for 20 minutes max 4-5 times a day if possible to decrease edema. Experienced 9/10 pain following treatment.   Pt will benefit from skilled therapeutic intervention in order to improve on the following deficits Pain;Decreased activity tolerance;Decreased strength   Rehab Potential Excellent   PT Frequency 3x / week   PT Duration 8 weeks   PT Treatment/Interventions ADLs/Self Care Home Management;Cryotherapy;Electrical Stimulation;Manual techniques;Therapeutic activities;Therapeutic exercise;Vasopneumatic Device;Neuromuscular re-education;Patient/family education   PT Next Visit Plan Left knee strengthening especially quadriceps; hip abduction and ER. Vasopneumatic.  VMO strengthening.   Consulted and Agree with Plan of Care Patient          G-Codes - 05/02/2015 1602    Functional Assessment Tool Used FOTO.   Functional Limitation Mobility: Walking and moving around   Mobility: Walking and Moving Around Current Status [phone]) At least 40 percent but less than 60 percent impaired, limited or restricted   Mobility: Walking and Moving Around Goal Status [phone]) At least 20 percent but less than 40 percent impaired, limited or restricted      Problem List There are no active problems to display for this patient.   Wynelle Fanny, PTA 04/25/2015, 3:39 PM  [facility] [address] [place], [place], [address] Phone: [phone]   Fax:  [phone]

## 2015-04-30 ENCOUNTER — Ambulatory Visit: Payer: Medicare HMO | Admitting: Physical Therapy

## 2015-04-30 DIAGNOSIS — M25562 Pain in left knee: Secondary | ICD-10-CM

## 2015-04-30 DIAGNOSIS — R609 Edema, unspecified: Secondary | ICD-10-CM

## 2015-04-30 NOTE — Therapy (Signed)
[facility] [place], [place], [address] Phone: [phone]   Fax:  [phone]  Physical Therapy Treatment  Patient Details  Name: Tina Sampson MRN: [id number] Date of Birth: 29-Mar-1961 Referring Provider:  Case, Reche Dixon, MD  Encounter Date: 04/30/2015      PT End of Session - 04/30/15 1305    Visit Number 3   Number of Visits 12   Date for PT Re-Evaluation 06/05/15   PT Start Time 1115   PT Stop Time 1202   PT Time Calculation (min) 47 min   Activity Tolerance Patient tolerated treatment well   Behavior During Therapy [facility] for tasks assessed/performed      Past Medical History  Diagnosis Date  . Diabetes mellitus   . H/O knee surgery     Past Surgical History  Procedure Laterality Date  . Cervical disc surgery    . Cholecystectomy    . Cesarean section    . Knee surgery    . Catherization    . Knee surgery    . Tubal ligation    . Ankle surgery      There were no vitals filed for this visit.  Visit Diagnosis:  Left knee pain  Edema      Subjective Assessment - 04/30/15 1133    Subjective Kneecap is very tender.   Pain Score 8    Pain Location Knee                         [facility] Adult PT Treatment/Exercise - 04/30/15 0001    Knee/Hip Exercises: Aerobic   Nustep Level 4 x 15 minutes.   Knee/Hip Exercises: Supine   Short Arc Field seismologist Sets Limitations 15 minutes with no weight facilitated with VMS to left VMO with patient perfoming SAQ's with 10 sec extension holds and 10 sec rest with IR of hip.   Modalities   Modalities Vasopneumatic   Vasopneumatic   Number Minutes Vasopneumatic  15 minutes   Vasopnuematic Location  --  Left knee.   Vasopneumatic Pressure Medium    Rockerboard x 5 minutes in parallel bars.              PT Short Term Goals - 04/24/15 1603    PT SHORT TERM GOAL #1   Title Ind with HEP.   Time 2   Period  Weeks   Status New           PT Long Term Goals - 04/24/15 1603    PT LONG TERM GOAL #1   Title No complaints of left patellla "popping out."   Time 4   Period Weeks   Status New   PT LONG TERM GOAL #2   Title 5/5 left knee and hip strength to increase stability for functional tasks.   Time 4   Period Weeks   Status New   PT LONG TERM GOAL #3   Title Walk a community distance wiht pain not > 3/10.               Problem List There are no active problems to display for this patient.   APPLEGATE, [place] MPT 04/30/2015, 1:07 PM  [facility] [address] [place], [place], [address] Phone: [phone]   Fax:  [phone]

## 2015-05-03 ENCOUNTER — Ambulatory Visit: Payer: Medicare HMO | Admitting: Physical Therapy

## 2015-05-03 DIAGNOSIS — M25562 Pain in left knee: Secondary | ICD-10-CM

## 2015-05-03 DIAGNOSIS — R609 Edema, unspecified: Secondary | ICD-10-CM

## 2015-05-03 NOTE — Therapy (Signed)
[facility] [place], [place], [address] Phone: [phone]   Fax:  [phone]  Physical Therapy Treatment  Patient Details  Name: Tina Sampson MRN: [id number] Date of Birth: 10-31-1960 Referring Provider:  Case, Reche Dixon, MD  Encounter Date: 05/03/2015      PT End of Session - 05/03/15 1103    Visit Number 4   Number of Visits 12   Date for PT Re-Evaluation 06/05/15   PT Start Time 7078   PT Stop Time 1124   PT Time Calculation (min) 56 min      Past Medical History  Diagnosis Date  . Diabetes mellitus   . H/O knee surgery     Past Surgical History  Procedure Laterality Date  . Cervical disc surgery    . Cholecystectomy    . Cesarean section    . Knee surgery    . Catherization    . Knee surgery    . Tubal ligation    . Ankle surgery      There were no vitals filed for this visit.  Visit Diagnosis:  Left knee pain  Edema      Subjective Assessment - 05/03/15 1113    Subjective I'm doing better overall and my pain is a 2/10 today.  Have not done much today though.  Stairs are difficult still.   How long can you walk comfortably? 15 minutes.   Pain Score 3    Pain Location Knee   Pain Orientation Left   Pain Descriptors / Indicators Sore;Tightness   Pain Onset More than a month ago   Pain Frequency Constant            [facility] PT Assessment - 05/03/15 0001    Observation/Other Assessments-Edema    Edema Circumferential   Circumferential Edema   Circumferential - Right 2 cm difference today.                     [facility] Adult PT Treatment/Exercise - 05/03/15 0001    Knee/Hip Exercises: Aerobic   Nustep Level 5 x 15 minutes.   Knee/Hip Exercises: Supine   Short Arc Quad Sets Limitations Left VMS to VMO x 15 minutes with 3# (10 sec on and 10 sec off).   Vasopneumatic   Number Minutes Vasopneumatic  15 minutes   Vasopnuematic Location  --  Left knee.   Vasopneumatic  Pressure Medium                  PT Short Term Goals - 04/24/15 1603    PT SHORT TERM GOAL #1   Title Ind with HEP.   Time 2   Period Weeks   Status New           PT Long Term Goals - 04/24/15 1603    PT LONG TERM GOAL #1   Title No complaints of left patellla "popping out."   Time 4   Period Weeks   Status New   PT LONG TERM GOAL #2   Title 5/5 left knee and hip strength to increase stability for functional tasks.   Time 4   Period Weeks   Status New   PT LONG TERM GOAL #3   Title Walk a community distance wiht pain not > 3/10.               Plan - 05/03/15 1115    Clinical Impression Statement I'm doing better overall and my pain is a  2/10 today.  Have not done much today though.  Stairs are difficult still.   Pt will benefit from skilled therapeutic intervention in order to improve on the following deficits Pain;Decreased activity tolerance;Decreased strength   Rehab Potential Excellent   PT Frequency 3x / week   PT Duration 8 weeks   PT Treatment/Interventions ADLs/Self Care Home Management;Cryotherapy;Electrical Stimulation;Manual techniques;Therapeutic activities;Therapeutic exercise;Vasopneumatic Device;Neuromuscular re-education;Patient/family education        Problem List There are no active problems to display for this patient.   APPLEGATE, [place] MPT 05/03/2015, 11:26 AM  [facility] [address] [place], [place], [address] Phone: [phone]   Fax:  [phone]

## 2015-05-07 ENCOUNTER — Ambulatory Visit: Payer: Medicare HMO | Attending: Orthopedic Surgery | Admitting: Physical Therapy

## 2015-05-07 DIAGNOSIS — M25562 Pain in left knee: Secondary | ICD-10-CM | POA: Insufficient documentation

## 2015-05-07 NOTE — Therapy (Signed)
[facility] [place], [place], [address] Phone: [phone]   Fax:  [phone]  Physical Therapy Treatment  Patient Details  Name: Tina Sampson MRN: [id number] Date of Birth: [birth year] Referring Provider:  Case, Reche Dixon, MD  Encounter Date: 05/07/2015      PT End of Session - 05/07/15 1526    Visit Number 5   Number of Visits 12   Date for PT Re-Evaluation 06/05/15   PT Start Time 0230   PT Stop Time 0321   PT Time Calculation (min) 51 min   Activity Tolerance Patient tolerated treatment well   Behavior During Therapy [facility] for tasks assessed/performed      Past Medical History  Diagnosis Date  . Diabetes mellitus   . H/O knee surgery     Past Surgical History  Procedure Laterality Date  . Cervical disc surgery    . Cholecystectomy    . Cesarean section    . Knee surgery    . Catherization    . Knee surgery    . Tubal ligation    . Ankle surgery      There were no vitals filed for this visit.  Visit Diagnosis:  Left knee pain      Subjective Assessment - 05/07/15 1519    Subjective Woke up in extreme pain last night.  Patient c/o pain in the area of her left medial incisional site.   Limitations Walking   How long can you walk comfortably? 15 minutes.   Pain Score 7    Pain Location Knee   Pain Orientation Left   Pain Descriptors / Indicators --  "Hurts."                         [facility] Adult PT Treatment/Exercise - 05/07/15 0001    Knee/Hip Exercises: Aerobic   Nustep Level 5 x 15 minutes.   Knee/Hip Exercises: Supine   Short Arc Quad Sets Limitations Non-resisted left SAQ facilitated with VMS x 10 minutes (10 sec on and 10 sec off).   Manual Therapy   Manual therapy comments STW/M in area of left medial infrapatellar fat pad x 143 minutes.                  PT Short Term Goals - 05/07/15 1528    PT SHORT TERM GOAL #1   Time 2   Period Weeks   Status  On-going           PT Long Term Goals - 05/07/15 1528    PT LONG TERM GOAL #1   Title No complaints of left patellla "popping out."   Time 4   Period Weeks   Status On-going   PT LONG TERM GOAL #2   Title 5/5 left knee and hip strength to increase stability for functional tasks.   Time 4   Period Weeks   Status On-going   PT LONG TERM GOAL #3   Title Walk a community distance with pain not > 3/10.   Status On-going   PT LONG TERM GOAL #4   Title perform ADL's with pain not >3/10   Status On-going               Plan - 05/07/15 1527    Clinical Impression Statement The patient woke up in extreme pain last night.  er CC is in the area of the left medial infrapatellar fat pad.  She wonders if "  a stitch" is still in there."   Pt will benefit from skilled therapeutic intervention in order to improve on the following deficits Pain;Decreased activity tolerance;Decreased strength   Rehab Potential Excellent   PT Frequency 3x / week   PT Duration 8 weeks   PT Treatment/Interventions ADLs/Self Care Home Management;Cryotherapy;Electrical Stimulation;Manual techniques;Therapeutic activities;Therapeutic exercise;Vasopneumatic Device;Neuromuscular re-education;Patient/family education   PT Next Visit Plan Left knee strengthening especially quadriceps; hip abduction and ER. Vasopneumatic.  VMO strengthening.   Consulted and Agree with Plan of Care Patient        Problem List There are no active problems to display for this patient.   Jamarie Mussa, [place] MPT 05/07/2015, 3:34 PM  [facility] [address] [place], [place], [address] Phone: [phone]   Fax:  [phone]

## 2015-05-10 ENCOUNTER — Encounter: Payer: Medicare HMO | Admitting: Physical Therapy

## 2015-05-14 ENCOUNTER — Ambulatory Visit: Payer: Medicare HMO | Admitting: Physical Therapy

## 2015-05-14 DIAGNOSIS — M25562 Pain in left knee: Secondary | ICD-10-CM

## 2015-05-14 NOTE — Therapy (Signed)
[facility] [place], [place], [address] Phone: [phone]   Fax:  [phone]  Physical Therapy Treatment  Patient Details  Name: Tina Sampson MRN: [id number] Date of Birth: 26-May-1961 Referring Provider:  Case, Reche Dixon, MD  Encounter Date: 05/14/2015    Past Medical History  Diagnosis Date  . Diabetes mellitus   . H/O knee surgery     Past Surgical History  Procedure Laterality Date  . Cervical disc surgery    . Cholecystectomy    . Cesarean section    . Knee surgery    . Catherization    . Knee surgery    . Tubal ligation    . Ankle surgery      There were no vitals filed for this visit.  Visit Diagnosis:  Left knee pain      Subjective Assessment - 05/14/15 1457    Subjective Been carrying wood.  Also did squats at home.  They were painful.  Told patient to perform them only through a pain-free range of motion.  I'm using my [place].  Last treatment helped.   Limitations Walking   How long can you walk comfortably? 15 minutes.   Pain Score 3    Pain Location Knee   Pain Orientation Left                         [facility] Adult PT Treatment/Exercise - 05/14/15 0001    Exercises   Exercises Knee/Hip   Knee/Hip Exercises: Aerobic   Nustep Levl 6 x 15 minutes.   Knee/Hip Exercises: Supine   Short Arc Quad Sets Limitations 5# SAQ's facilitated with VMS (10 sec extension hold and 10 sec rest).                  PT Short Term Goals - 05/07/15 1528    PT SHORT TERM GOAL #1   Time 2   Period Weeks   Status On-going           PT Long Term Goals - 05/07/15 1528    PT LONG TERM GOAL #1   Title No complaints of left patellla "popping out."   Time 4   Period Weeks   Status On-going   PT LONG TERM GOAL #2   Title 5/5 left knee and hip strength to increase stability for functional tasks.   Time 4   Period Weeks   Status On-going   PT LONG TERM GOAL #3   Title Walk a  community distance with pain not > 3/10.   Status On-going   PT LONG TERM GOAL #4   Title perform ADL's with pain not >3/10   Status On-going               Problem List There are no active problems to display for this patient.   Beth Goodlin, [place] MPT 05/14/2015, 5:02 PM  [facility] [address] [place], [place], [address] Phone: [phone]   Fax:  [phone]

## 2015-05-18 ENCOUNTER — Ambulatory Visit: Payer: Medicare HMO | Admitting: Physical Therapy

## 2015-05-18 DIAGNOSIS — M25562 Pain in left knee: Secondary | ICD-10-CM

## 2015-05-18 NOTE — Therapy (Signed)
[facility] [place], [place], [address] Phone: [phone]   Fax:  [phone]  Physical Therapy Treatment  Patient Details  Name: Tina Sampson MRN: [id number] Date of Birth: [birth year] No Data Recorded  Encounter Date: 05/18/2015      PT End of Session - 05/18/15 1133    Visit Number 6   Number of Visits 12   Date for PT Re-Evaluation 06/05/15   PT Start Time 0859   PT Stop Time 0936   PT Time Calculation (min) 37 min   Activity Tolerance Patient tolerated treatment well   Behavior During Therapy [facility] for tasks assessed/performed      Past Medical History  Diagnosis Date  . Diabetes mellitus   . H/O knee surgery     Past Surgical History  Procedure Laterality Date  . Cervical disc surgery    . Cholecystectomy    . Cesarean section    . Knee surgery    . Catherization    . Knee surgery    . Tubal ligation    . Ankle surgery      There were no vitals filed for this visit.  Visit Diagnosis:  Left knee pain      Subjective Assessment - 05/18/15 1137    Subjective My knee felt mech better so I did a lot of walking.  One session I did over 6,000 steps.   Limitations Walking   How long can you walk comfortably? 15 minutes.   Pain Score 2    Pain Location Knee   Pain Orientation Left                         [facility] Adult PT Treatment/Exercise - 05/18/15 1201    Manual Therapy   Manual therapy comments STW/M in area of left medial infrapatellar fat pad x 15 minutes.                  PT Short Term Goals - 05/07/15 1528    PT SHORT TERM GOAL #1   Time 2   Period Weeks   Status On-going           PT Long Term Goals - 05/18/15 1203    PT LONG TERM GOAL #1   Title No complaints of left patellla "popping out."   Time 4   Period Weeks   Status On-going   PT LONG TERM GOAL #2   Title 5/5 left knee and hip strength to increase stability for functional tasks.   Time  4   Period Weeks   Status On-going   PT LONG TERM GOAL #3   Title Walk a community distance with pain not > 3/10.   Status On-going   PT LONG TERM GOAL #4   Title perform ADL's with pain not >3/10   Status On-going               Plan - 05/18/15 1202    Clinical Impression Statement The patient is reporting a reduction in her left knee pain such that she was able to do a lot of walking recently.   Pt will benefit from skilled therapeutic intervention in order to improve on the following deficits Pain;Decreased activity tolerance;Decreased strength   Rehab Potential Excellent   PT Duration 8 weeks   PT Treatment/Interventions ADLs/Self Care Home Management;Cryotherapy;Electrical Stimulation;Manual techniques;Therapeutic activities;Therapeutic exercise;Vasopneumatic Device;Neuromuscular re-education;Patient/family education   Consulted and Agree with Plan of Care Patient  Problem List There are no active problems to display for this patient.   APPLEGATE, [place] MPT 05/18/2015, 12:05 PM  [facility] [address] [place], [place], [address] Phone: [phone]   Fax:  [phone]  Name: Tina Sampson MRN: [id number] Date of Birth: 1960-09-30

## 2015-05-22 ENCOUNTER — Ambulatory Visit: Payer: Medicare HMO | Admitting: Physical Therapy

## 2015-05-22 DIAGNOSIS — M25562 Pain in left knee: Secondary | ICD-10-CM

## 2015-05-22 NOTE — Therapy (Signed)
[facility] [place], [place], [address] Phone: [phone]   Fax:  [phone]  Physical Therapy Treatment  Patient Details  Name: Tina Sampson MRN: [id number] Date of Birth: 11-07-1960 No Data Recorded  Encounter Date: 05/22/2015      PT End of Session - 05/22/15 1308    Visit Number 7   Number of Visits 12   Date for PT Re-Evaluation 06/05/15   PT Start Time 1030   PT Stop Time 1106   PT Time Calculation (min) 36 min   Activity Tolerance Patient tolerated treatment well   Behavior During Therapy [facility] for tasks assessed/performed      Past Medical History  Diagnosis Date  . Diabetes mellitus   . H/O knee surgery     Past Surgical History  Procedure Laterality Date  . Cervical disc surgery    . Cholecystectomy    . Cesarean section    . Knee surgery    . Catherization    . Knee surgery    . Tubal ligation    . Ankle surgery      There were no vitals filed for this visit.  Visit Diagnosis:  Left knee pain      Subjective Assessment - 05/22/15 1304    Subjective Have been doing much better but last weekend my husband and I were getitng up at the same time amd "knocked knees".  This produced severe pain.   Limitations Walking   How long can you walk comfortably? 15 minutes.   Pain Score 3    Pain Location Knee   Pain Orientation Left   Pain Descriptors / Indicators Aching                         [facility] Adult PT Treatment/Exercise - 05/22/15 1307    Manual Therapy   Manual therapy comments STW/M in area of left medial infrapatellar fat pad x 15 minutes.      SAQ's with 5# x 15 minutes facilitated with VMS to patient's left medial quadriceps (10 sec on and 10 sec off).            PT Short Term Goals - 05/07/15 1528    PT SHORT TERM GOAL #1   Time 2   Period Weeks   Status On-going           PT Long Term Goals - 05/18/15 1203    PT LONG TERM GOAL #1   Title  No complaints of left patellla "popping out."   Time 4   Period Weeks   Status On-going   PT LONG TERM GOAL #2   Title 5/5 left knee and hip strength to increase stability for functional tasks.   Time 4   Period Weeks   Status On-going   PT LONG TERM GOAL #3   Title Walk a community distance with pain not > 3/10.   Status On-going   PT LONG TERM GOAL #4   Title perform ADL's with pain not >3/10   Status On-going               Plan - 05/22/15 1308    Pt will benefit from skilled therapeutic intervention in order to improve on the following deficits Pain;Decreased activity tolerance;Decreased strength   Rehab Potential Excellent   PT Frequency 3x / week   PT Duration 8 weeks   PT Treatment/Interventions ADLs/Self Care Home Management;Cryotherapy;Electrical Stimulation;Manual techniques;Therapeutic activities;Therapeutic exercise;Vasopneumatic Device;Neuromuscular  re-education;Patient/family education   PT Next Visit Plan Left knee strengthening especially quadriceps; hip abduction and ER. Vasopneumatic.  VMO strengthening.        Problem List There are no active problems to display for this patient.   Skyeler Scalese, [place] MPT 05/22/2015, 1:12 PM  [facility] [address] [place], [place], [address] Phone: [phone]   Fax:  [phone]  Name: Tina Sampson MRN: [id number] Date of Birth: 03-17-1961

## 2015-05-28 ENCOUNTER — Encounter: Payer: Medicare HMO | Admitting: Physical Therapy

## 2015-05-29 ENCOUNTER — Encounter: Payer: Self-pay | Admitting: Physical Therapy

## 2015-05-29 ENCOUNTER — Ambulatory Visit: Payer: Medicare HMO | Admitting: Physical Therapy

## 2015-05-29 DIAGNOSIS — M25562 Pain in left knee: Secondary | ICD-10-CM | POA: Diagnosis not present

## 2015-05-29 NOTE — Therapy (Signed)
[facility] [place], [place], [address] Phone: [phone]   Fax:  [phone]  Physical Therapy Treatment  Patient Details  Name: Tina Sampson MRN: [id number] Date of Birth: [birth year] Referring Provider: Dr. Case  Encounter Date: 05/29/2015      PT End of Session - 05/29/15 1041    Visit Number 8   Number of Visits 12   Date for PT Re-Evaluation 06/05/15   PT Start Time 1031   PT Stop Time 1122   PT Time Calculation (min) 51 min   Activity Tolerance Patient tolerated treatment well   Behavior During Therapy [facility] for tasks assessed/performed      Past Medical History  Diagnosis Date  . Diabetes mellitus   . H/O knee surgery     Past Surgical History  Procedure Laterality Date  . Cervical disc surgery    . Cholecystectomy    . Cesarean section    . Knee surgery    . Catherization    . Knee surgery    . Tubal ligation    . Ankle surgery      There were no vitals filed for this visit.  Visit Diagnosis:  Left knee pain      Subjective Assessment - 05/29/15 1038    Subjective Reports that she went walking this morning and notes that her hip hurt worse than her knee. Notes that MD wants knee strengthening with weights and icing. Reports that she feels dizzy when walking. Only taking pain meds if she absolutely has to.   Limitations Walking   How long can you walk comfortably? 15 minutes.   Currently in Pain? 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   Referring Provider Dr. Case   Onset Date/Surgical Date 04/06/15   Next MD Visit 08/2015                     [facility] Adult PT Treatment/Exercise - 05/29/15 0001    Knee/Hip Exercises: Aerobic   Nustep L6 x15  min   Knee/Hip Exercises: Machines for Strengthening   Cybex Knee Extension 10# 3x10 reps  "pop/ catch" at the end range of flexion on machine laterall   Cybex Knee Flexion 20# 1x10 reps, 30# 2x10 reps   Knee/Hip Exercises: Standing   Lateral Step Up Left;3 sets;10 reps;Hand Hold: 2;Step Height: 4"  Felt pop medially but requested not to stop exercise   Forward Step Up Left;3 sets;10 reps;Hand Hold: 2;Step Height: 6"   Other Standing Knee Exercises L knee TKE pink XTS x30 reps   Knee/Hip Exercises: Supine   Short Arc Quad Sets Strengthening;Left;Other (comment)  with VMS 5#   Modalities   Modalities Retail buyer Location L VMO/ Orthoptist Action VMS   Electrical Stimulation Parameters 10/10 x15 min with 5#   Printmaker Goals Strength                  PT Short Term Goals - 05/07/15 1528    PT SHORT TERM GOAL #1   Time 2   Period Weeks  Status On-going           PT Long Term Goals - 05/29/15 1115    PT LONG TERM GOAL #1   Title No complaints of left patellla "popping out."   Time 4   Period Weeks   Status On-going   PT LONG TERM GOAL #2   Title 5/5 left knee and hip strength to increase stability for functional tasks.   Time 4   Period Weeks   Status On-going   PT LONG TERM GOAL #3   Title Walk a community distance with pain not > 3/10.   Status On-going   PT LONG TERM GOAL #4   Title perform ADL's with pain not >3/10   Status Achieved               Plan - 05/29/15 1209    Clinical Impression Statement Patient tolerated today's treatment well with the treatment focusing on pain free knee strengthening. Experienced L lateral knee "catch" during machine knee extension and experienced a pop in L knee during lateral step ups but patient requested to continue exercise. Completed all exercises as directed with minimal multimodal cueing for correct exercise  technique. Normal VMS strengthening response with the stimulation. Achieved ADLs goal today but notes that she still experiences increased pain with prolonged ambulation and now reported dizziness which she was cautioned to be careful with dizziness symptom. Patient was educated to ice upon arriving home from today's treatment and to ice whenever L knee is swollen as MD ordered.   Pt will benefit from skilled therapeutic intervention in order to improve on the following deficits Pain;Decreased activity tolerance;Decreased strength   Rehab Potential Excellent   PT Frequency 3x / week   PT Duration 8 weeks   PT Treatment/Interventions ADLs/Self Care Home Management;Cryotherapy;Electrical Stimulation;Manual techniques;Therapeutic activities;Therapeutic exercise;Vasopneumatic Device;Neuromuscular re-education;Patient/family education   PT Next Visit Plan Left knee strengthening especially quadriceps; hip abduction and ER. Vasopneumatic.  VMO strengthening.   Consulted and Agree with Plan of Care Patient        Problem List There are no active problems to display for this patient.   Wynelle Fanny, PTA 05/29/2015, 12:14 PM  [facility] [address] [place], [place], [address] Phone: [phone]   Fax:  [phone]  Name: Tina Sampson MRN: [id number] Date of Birth: 01-Jul-1961

## 2015-05-30 ENCOUNTER — Encounter: Payer: Medicare HMO | Admitting: Physical Therapy

## 2015-05-31 ENCOUNTER — Ambulatory Visit: Payer: Medicare HMO | Admitting: Physical Therapy

## 2015-05-31 DIAGNOSIS — M25562 Pain in left knee: Secondary | ICD-10-CM | POA: Diagnosis not present

## 2015-05-31 NOTE — Therapy (Signed)
[facility] [place], [place], [address] Phone: [phone]   Fax:  [phone]  Physical Therapy Treatment  Patient Details  Name: Tina Sampson MRN: [id number] Date of Birth: 1960/12/14 Referring Provider: Dr. Case  Encounter Date: 15-Jun-2015      PT End of Session - Jun 15, 2015 1608    Visit Number 9   Number of Visits 12   Date for PT Re-Evaluation 06/05/15   PT Start Time 0400      Past Medical History  Diagnosis Date  . Diabetes mellitus   . H/O knee surgery     Past Surgical History  Procedure Laterality Date  . Cervical disc surgery    . Cholecystectomy    . Cesarean section    . Knee surgery    . Catherization    . Knee surgery    . Tubal ligation    . Ankle surgery      There were no vitals filed for this visit.  Visit Diagnosis:  Left knee pain      Subjective Assessment - 06-15-15 1608    Subjective Woke up at 2 am and took a pain pill.  Been blowing and picking up leaves.   Limitations Walking   How long can you walk comfortably? 15 minutes.   Pain Score 2    Pain Location Knee   Pain Orientation Left   Pain Descriptors / Indicators Sore   Pain Onset More than a month ago   Pain Frequency Constant                         [facility] Adult PT Treatment/Exercise - 06-15-15 0001    Exercises   Exercises Knee/Hip   Knee/Hip Exercises: Aerobic   Nustep Level 6 x 15 minutes.   Knee/Hip Exercises: Machines for Strengthening   Cybex Knee Extension 10# 45 to 0 degrees x 5 minutes.   Cybex Knee Flexion 30# 0 to 45 degrees x 5 minutes   Knee/Hip Exercises: Seated   Long Arc Quad Limitations SAQ's with 5 # x 15 minutes (10 sec on and 10 sec off)                  PT Short Term Goals - 05/07/15 1528    PT SHORT TERM GOAL #1   Time 2   Period Weeks   Status On-going           PT Long Term Goals - 05/29/15 1115    PT LONG TERM GOAL #1   Title No complaints of  left patellla "popping out."   Time 4   Period Weeks   Status On-going   PT LONG TERM GOAL #2   Title 5/5 left knee and hip strength to increase stability for functional tasks.   Time 4   Period Weeks   Status On-going   PT LONG TERM GOAL #3   Title Walk a community distance with pain not > 3/10.   Status On-going   PT LONG TERM GOAL #4   Title perform ADL's with pain not >3/10   Status Achieved                 G-Codes - 15-Jun-2015 1706    Functional Assessment Tool Used FOTO-----44% limitation.   Functional Limitation Mobility: Walking and moving around   Mobility: Walking and Moving Around Current Status [phone]) At least 40 percent but less than 60 percent impaired, limited or  restricted   Mobility: Walking and Moving Around Goal Status [phone]) At least 20 percent but less than 40 percent impaired, limited or restricted      Problem List There are no active problems to display for this patient.   APPLEGATE, [place] MPT 05/31/2015, 5:10 PM  [facility] [address] [place], [place], [address] Phone: [phone]   Fax:  [phone]  Name: TRENA DUNAVAN MRN: [id number] Date of Birth: Nov 02, 1960

## 2015-06-04 ENCOUNTER — Ambulatory Visit: Payer: Medicare HMO | Admitting: Physical Therapy

## 2015-06-04 DIAGNOSIS — M25562 Pain in left knee: Secondary | ICD-10-CM | POA: Diagnosis not present

## 2015-06-04 NOTE — Therapy (Signed)
[facility] [place], [place], [address] Phone: [phone]   Fax:  [phone]  Physical Therapy Treatment  Patient Details  Name: Tina Sampson MRN: [id number] Date of Birth: [birth year] Referring Provider: Dr. Case  Encounter Date: 06-24-15      PT End of Session - 24-Jun-2015 1336    Visit Number 11   Number of Visits 12   Date for PT Re-Evaluation 06/05/15   PT Start Time 0100   PT Stop Time 0148   PT Time Calculation (min) 48 min   Activity Tolerance Patient tolerated treatment well   Behavior During Therapy [facility] for tasks assessed/performed      Past Medical History  Diagnosis Date  . Diabetes mellitus   . H/O knee surgery     Past Surgical History  Procedure Laterality Date  . Cervical disc surgery    . Cholecystectomy    . Cesarean section    . Knee surgery    . Catherization    . Knee surgery    . Tubal ligation    . Ankle surgery      There were no vitals filed for this visit.  Visit Diagnosis:  Left knee pain                                 PT Short Term Goals - 05/07/15 1528    PT SHORT TERM GOAL #1   Time 2   Period Weeks   Status On-going           PT Long Term Goals - 05/29/15 1115    PT LONG TERM GOAL #1   Title No complaints of left patellla "popping out."   Time 4   Period Weeks   Status On-going   PT LONG TERM GOAL #2   Title 5/5 left knee and hip strength to increase stability for functional tasks.   Time 4   Period Weeks   Status On-going   PT LONG TERM GOAL #3   Title Walk a community distance with pain not > 3/10.   Status On-going   PT LONG TERM GOAL #4   Title perform ADL's with pain not >3/10   Status Achieved                 G-Codes - 06-24-15 1333    Functional Assessment Tool Used FOTO-----44% limitation.     Treatment:  Nustep level 5 x 15 minutes f/b VMS to left medial quadriceps while patient performed SAQ's  with 5# over 23 minutes (10 sec extension holds and 10 sec rests).  Problem List There are no active problems to display for this patient.   Franklin Clapsaddle, [place] MPT 06-24-15, 2:02 PM  [facility] [address] [place], [place], [address] Phone: [phone]   Fax:  [phone]  Name: Tina Sampson MRN: [id number] Date of Birth: [birth year]

## 2015-06-05 ENCOUNTER — Encounter (INDEPENDENT_AMBULATORY_CARE_PROVIDER_SITE_OTHER): Payer: Self-pay

## 2015-06-05 ENCOUNTER — Ambulatory Visit: Payer: Medicare HMO | Attending: Orthopedic Surgery | Admitting: Physical Therapy

## 2015-06-05 DIAGNOSIS — M25562 Pain in left knee: Secondary | ICD-10-CM | POA: Insufficient documentation

## 2015-06-05 NOTE — Therapy (Signed)
[facility] [place], [place], [address] Phone: [phone]   Fax:  [phone]  Physical Therapy Treatment  Patient Details  Name: Tina Sampson MRN: [id number] Date of Birth: [birth year] Referring Provider: Dr. Case  Encounter Date: 06/05/2015      PT End of Session - 06/05/15 1655    Visit Number 12   Number of Visits 12   Date for PT Re-Evaluation 06/05/15   PT Start Time 1030   PT Stop Time 1112   PT Time Calculation (min) 42 min      Past Medical History  Diagnosis Date  . Diabetes mellitus   . H/O knee surgery     Past Surgical History  Procedure Laterality Date  . Cervical disc surgery    . Cholecystectomy    . Cesarean section    . Knee surgery    . Catherization    . Knee surgery    . Tubal ligation    . Ankle surgery      There were no vitals filed for this visit.  Visit Diagnosis:  Left knee pain      Subjective Assessment - 06/05/15 1654    Subjective Woke up with no pain and then went for a long walk and my knee felt good.   Pain Score 1    Pain Location Knee   Pain Orientation Left   Pain Descriptors / Indicators Sore   Pain Onset More than a month ago   Pain Frequency Constant                                   PT Short Term Goals - 05/07/15 1528    PT SHORT TERM GOAL #1   Time 2   Period Weeks   Status On-going           PT Long Term Goals - 05/29/15 1115    PT LONG TERM GOAL #1   Title No complaints of left patellla "popping out."   Time 4   Period Weeks   Status On-going   PT LONG TERM GOAL #2   Title 5/5 left knee and hip strength to increase stability for functional tasks.   Time 4   Period Weeks   Status On-going   PT LONG TERM GOAL #3   Title Walk a community distance with pain not > 3/10.   Status On-going   PT LONG TERM GOAL #4   Title perform ADL's with pain not >3/10   Status Achieved                 G-Codes  - 06/29/2015 1333    Functional Assessment Tool Used FOTO-----44% limitation.     Treatment:  Nustep level 5 x 15 minutes f/b knee extension machine (10#) and 40# ham curls) x 10 minutes total f/b SAQ's with 5# x 10 minutes facilitated with VMS to left medial quadriceps (10 sec extension holds and 10 sec rest).  Excellent job today.  Problem List There are no active problems to display for this patient.   Caress Reffitt, [place] MPT 06/05/2015, 4:56 PM  [facility] [address] [place], [place], [address] Phone: [phone]   Fax:  [phone]  Name: Tina Sampson MRN: [id number] Date of Birth: 03/13/1961

## 2015-06-07 ENCOUNTER — Encounter: Payer: Medicare HMO | Admitting: Physical Therapy

## 2015-06-11 ENCOUNTER — Ambulatory Visit: Payer: Medicare HMO | Admitting: Physical Therapy

## 2015-06-11 DIAGNOSIS — M25562 Pain in left knee: Secondary | ICD-10-CM | POA: Diagnosis not present

## 2015-06-11 NOTE — Therapy (Signed)
[facility] [place], [place], [address] Phone: [phone]   Fax:  [phone]  Physical Therapy Treatment  Patient Details  Name: Tina Sampson MRN: [id number] Date of Birth: 11-08-1960 Referring Provider: Dr. Case  Encounter Date: 06/11/2015      PT End of Session - 06/11/15 1145    Visit Number 13   Date for PT Re-Evaluation 06/12/15   PT Start Time 1030   PT Stop Time 1122   PT Time Calculation (min) 52 min   Activity Tolerance Patient tolerated treatment well   Behavior During Therapy [facility] for tasks assessed/performed      Past Medical History  Diagnosis Date  . Diabetes mellitus   . H/O knee surgery     Past Surgical History  Procedure Laterality Date  . Cervical disc surgery    . Cholecystectomy    . Cesarean section    . Knee surgery    . Catherization    . Knee surgery    . Tubal ligation    . Ankle surgery      There were no vitals filed for this visit.  Visit Diagnosis:  Left knee pain                                 PT Short Term Goals - 05/07/15 1528    PT SHORT TERM GOAL #1   Time 2   Period Weeks   Status On-going           PT Long Term Goals - 05/29/15 1115    PT LONG TERM GOAL #1   Title No complaints of left patellla "popping out."   Time 4   Period Weeks   Status On-going   PT LONG TERM GOAL #2   Title 5/5 left knee and hip strength to increase stability for functional tasks.   Time 4   Period Weeks   Status On-going   PT LONG TERM GOAL #3   Title Walk a community distance with pain not > 3/10.   Status On-going   PT LONG TERM GOAL #4   Title perform ADL's with pain not >3/10   Status Achieved     Treatment:  SAQ's x 28 minutes with 5# facilitated with VMS (10 sec extension holds and 10 sec rest).  10# knee extension machine x 5 minutes and 40# ham curls x 5 minutes.  Excellent job today.          Problem List There are no  active problems to display for this patient.   Amoree Newlon, [place] MPT 06/11/2015, 12:02 PM  [facility] [address] [place], [place], [address] Phone: [phone]   Fax:  [phone]  Name: Tina Sampson MRN: [id number] Date of Birth: 1961/07/18

## 2015-06-12 ENCOUNTER — Ambulatory Visit: Payer: Medicare HMO | Admitting: Physical Therapy

## 2015-06-12 DIAGNOSIS — M25562 Pain in left knee: Secondary | ICD-10-CM

## 2015-06-12 NOTE — Therapy (Signed)
[facility] [place], [place], [address] Phone: [phone]   Fax:  [phone]  Physical Therapy Treatment  Patient Details  Name: Tina Sampson MRN: [id number] Date of Birth: 04-25-1961 Referring Provider: Dr. Case  Encounter Date: 06/12/2015      PT End of Session - 06/11/15 1145    Visit Number 13   Date for PT Re-Evaluation 06/12/15   PT Start Time 1030   PT Stop Time 1122   PT Time Calculation (min) 52 min   Activity Tolerance Patient tolerated treatment well   Behavior During Therapy [facility] for tasks assessed/performed      Past Medical History  Diagnosis Date  . Diabetes mellitus   . H/O knee surgery     Past Surgical History  Procedure Laterality Date  . Cervical disc surgery    . Cholecystectomy    . Cesarean section    . Knee surgery    . Catherization    . Knee surgery    . Tubal ligation    . Ankle surgery      There were no vitals filed for this visit.  Visit Diagnosis:  No diagnosis found.      Subjective Assessment - 06/12/15 1108    Subjective Knee feels pretty good today with a pain-level around 2/10.   Limitations Walking   How long can you walk comfortably? 15 minutes.   Pain Score 2    Pain Location Knee   Pain Orientation Left   Pain Descriptors / Indicators Sore   Pain Onset More than a month ago   Pain Frequency Constant                                   PT Short Term Goals - 05/07/15 1528    PT SHORT TERM GOAL #1   Time 2   Period Weeks   Status On-going           PT Long Term Goals - 05/29/15 1115    PT LONG TERM GOAL #1   Title No complaints of left patellla "popping out."   Time 4   Period Weeks   Status On-going   PT LONG TERM GOAL #2   Title 5/5 left knee and hip strength to increase stability for functional tasks.   Time 4   Period Weeks   Status On-going   PT LONG TERM GOAL #3   Title Walk a community distance with  pain not > 3/10.   Status On-going   PT LONG TERM GOAL #4   Title perform ADL's with pain not >3/10   Status Achieved     Treatment:  Nustep level 6 x 15 minutes f/b 10# knee extension machine x 5 minutes and 50# ham curls x 5 minutes f/b 5# SAQ's x 15 minutes facilitated with VMS (10 sec extension holds and 10 sec rest).  Great good today.          Problem List There are no active problems to display for this patient.   APPLEGATE, [place] MPT 06/12/2015, 11:10 AM  [facility] [address] [place], [place], [address] Phone: [phone]   Fax:  [phone]  Name: PAILYN BELLEVUE MRN: [id number] Date of Birth: [birth year]

## 2015-06-14 ENCOUNTER — Ambulatory Visit: Payer: Medicare HMO | Admitting: Physical Therapy

## 2015-06-14 DIAGNOSIS — M25562 Pain in left knee: Secondary | ICD-10-CM

## 2015-06-14 NOTE — Therapy (Signed)
[facility] [place], [place], [address] Phone: [phone]   Fax:  [phone]  Physical Therapy Treatment  Patient Details  Name: Tina Sampson MRN: [id number] Date of Birth: July 14, 1961 Referring Provider: Dr. Case  Encounter Date: 2015-07-05      PT End of Session - 07-05-2015 1203    Date for PT Re-Evaluation 06/12/15      Past Medical History  Diagnosis Date  . Diabetes mellitus   . H/O knee surgery     Past Surgical History  Procedure Laterality Date  . Cervical disc surgery    . Cholecystectomy    . Cesarean section    . Knee surgery    . Catherization    . Knee surgery    . Tubal ligation    . Ankle surgery      There were no vitals filed for this visit.  Visit Diagnosis:  Left knee pain      Subjective Assessment - 2015-07-05 1149    Subjective Overall much better.   Limitations Walking   How long can you walk comfortably? 15 minutes.   Pain Score 2    Pain Location Knee   Pain Orientation Left   Pain Descriptors / Indicators Sore   Pain Onset More than a month ago   Pain Frequency Constant                                   PT Short Term Goals - 07-05-15 1141    PT SHORT TERM GOAL #1   Title Ind with HEP.   Time 2   Period Weeks   Status Achieved           PT Long Term Goals - 07-05-2015 1142    PT LONG TERM GOAL #1   Title No complaints of left patellla "popping out."   Time 4   Period Weeks   Status Achieved   PT LONG TERM GOAL #2   Title 5/5 left knee and hip strength to increase stability for functional tasks.   Time 4   Period Weeks   Status Achieved   PT LONG TERM GOAL #3   Title Walk a community distance with pain not > 3/10.   Time 4   Status Achieved   PT LONG TERM GOAL #4   Title perform ADL's with pain not >3/10   Time 4   Period Weeks   Status Achieved                 G-Codes - 2015-07-05 1140    Functional Assessment  Tool Used FOTO.   Functional Limitation Mobility: Walking and moving around   Mobility: Walking and Moving Around Current Status [phone]) At least 40 percent but less than 60 percent impaired, limited or restricted   Mobility: Walking and Moving Around Goal Status [phone]) At least 20 percent but less than 40 percent impaired, limited or restricted   Mobility: Walking and Moving Around Discharge Status [phone]) At least 40 percent but less than 60 percent impaired, limited or restricted     Treatment:  Nustep level 6 x 15 minutes f/b 10# knee extension machine x 5 minutes f/b 50# ham curls x 5 minutes and SAQ's facilitated x 15 minutes with 5# (10 sec extension holds and 10 sec rest).  Patient tolerated well.  PHYSICAL THERAPY DISCHARGE SUMMARY  Visits from Start of Care: 15  Current functional level related to goals / functional outcomes: All goals met.   Remaining deficits: All goals met.   Education / Equipment: HEP. Plan: Patient agrees to discharge.  Patient goals were met. Patient is being discharged due to meeting the stated rehab goals.  ?????      Problem List There are no active problems to display for this patient.   APPLEGATE, [place] MPT 06/14/2015, 1:07 PM  [facility] [address] [place], [place], [address] Phone: [phone]   Fax:  [phone]  Name: Tina Sampson MRN: [id number] Date of Birth: Mar 25, 1961

## 2017-03-17 ENCOUNTER — Other Ambulatory Visit: Payer: Self-pay | Admitting: Oncology

## 2017-03-17 DIAGNOSIS — R590 Localized enlarged lymph nodes: Secondary | ICD-10-CM

## 2017-03-25 ENCOUNTER — Ambulatory Visit (HOSPITAL_COMMUNITY): Payer: Medicare HMO

## 2017-03-27 ENCOUNTER — Ambulatory Visit (HOSPITAL_COMMUNITY)
Admission: RE | Admit: 2017-03-27 | Discharge: 2017-03-27 | Disposition: A | Discharge status: Home / Self Care | Payer: Medicare HMO | Source: Ambulatory Visit | Attending: Oncology | Admitting: Oncology

## 2017-03-27 DIAGNOSIS — R590 Localized enlarged lymph nodes: Secondary | ICD-10-CM | POA: Insufficient documentation

## 2017-03-27 DIAGNOSIS — Z7984 Long term (current) use of oral hypoglycemic drugs: Secondary | ICD-10-CM | POA: Insufficient documentation

## 2017-03-27 DIAGNOSIS — Z791 Long term (current) use of non-steroidal anti-inflammatories (NSAID): Secondary | ICD-10-CM | POA: Diagnosis not present

## 2017-03-27 DIAGNOSIS — Z79899 Other long term (current) drug therapy: Secondary | ICD-10-CM | POA: Insufficient documentation

## 2017-03-27 LAB — GLUCOSE, CAPILLARY: GLUCOSE-CAPILLARY: 252 mg/dL — AB (ref 65–99)

## 2017-03-27 MED ORDER — FLUDEOXYGLUCOSE F - 18 (FDG) INJECTION
13.6000 | Freq: Once | INTRAVENOUS | Status: AC | PRN
  Administered 2017-03-27: 13.6 via INTRAVENOUS

## 2017-04-27 LAB — HEMOGLOBIN A1C: HEMOGLOBIN A1C: 8.8

## 2017-04-27 LAB — BASIC METABOLIC PANEL
BUN: 9 (ref 4–21)
Creatinine: 0.8 (ref <=1.1)

## 2017-06-10 ENCOUNTER — Encounter: Payer: Self-pay | Admitting: "Endocrinology

## 2017-06-10 ENCOUNTER — Ambulatory Visit: Payer: Medicare HMO | Admitting: "Endocrinology

## 2017-06-10 VITALS — BP 136/88 | HR 79 | Wt 275.0 lb

## 2017-06-10 DIAGNOSIS — E1165 Type 2 diabetes mellitus with hyperglycemia: Secondary | ICD-10-CM | POA: Diagnosis not present

## 2017-06-10 DIAGNOSIS — E782 Mixed hyperlipidemia: Secondary | ICD-10-CM | POA: Insufficient documentation

## 2017-06-10 NOTE — Progress Notes (Signed)
Consult Note       06/10/2017, 10:09 AM   Subjective:    Patient ID: Tina Sampson, female    DOB: 10/04/1960.  Tina Sampson is being seen in consultation for management of currently uncontrolled symptomatic diabetes requested by  Deloria Lair., MD.   Past Medical History:  Diagnosis Date  . Diabetes mellitus   . Diabetes mellitus, type II ([place])   . H/O knee surgery   . Hyperlipidemia    Past Surgical History:  Procedure Laterality Date  . ANKLE SURGERY    . CARDIAC CATHETERIZATION    . catherization    . CERVICAL DISC SURGERY    . CESAREAN SECTION    . CHOLECYSTECTOMY    . KNEE SURGERY    . KNEE SURGERY    . TUBAL LIGATION     Social History   Socioeconomic History  . Marital status: Married    Spouse name: None  . Number of children: None  . Years of education: None  . Highest education level: None  Social Needs  . Financial resource strain: None  . Food insecurity - worry: None  . Food insecurity - inability: None  . Transportation needs - medical: None  . Transportation needs - non-medical: None  Occupational History  . None  Tobacco Use  . Smoking status: Never Smoker  . Smokeless tobacco: Never Used  Substance and Sexual Activity  . Alcohol use: No  . Drug use: No  . Sexual activity: Yes  Other Topics Concern  . None  Social History Narrative  . None   Outpatient Encounter Medications as of 06/10/2017  Medication Sig  . aspirin EC 81 MG tablet Take 81 mg daily by mouth.  . B Complex Vitamins (VITAMIN B COMPLEX PO) Take daily by mouth.  . gabapentin (NEURONTIN) 300 MG capsule Take 900 mg 2 (two) times daily by mouth.  . metFORMIN (GLUCOPHAGE) 500 MG tablet Take 2 tablets 2 (two) times daily with a meal by mouth. 1000 mg in the a.m & 1000 mg in the p.m.   . Multiple Vitamin (MULTIVITAMIN) tablet Take 1 tablet daily by mouth.  Marland Kitchen omeprazole (PRILOSEC) 20 MG  capsule Take 20 mg daily by mouth.  . pramipexole (MIRAPEX) 0.125 MG tablet Take 0.125 mg by mouth at bedtime. For RLS  . pravastatin (PRAVACHOL) 40 MG tablet Take 40 mg by mouth daily.  . traZODone (DESYREL) 50 MG tablet Take 50 mg by mouth at bedtime.  Marland Kitchen venlafaxine (EFFEXOR) 75 MG tablet 37.5 mg 2 (two) times daily.  . [DISCONTINUED] cyclobenzaprine (FLEXERIL) 10 MG tablet Take 1 tablet (10 mg total) by mouth 3 (three) times daily as needed for muscle spasms.  . [DISCONTINUED] DULoxetine (CYMBALTA) 60 MG capsule Take 60 mg by mouth at bedtime.    . [DISCONTINUED] gabapentin (NEURONTIN) 300 MG capsule Take 600 mg by mouth 2 (two) times daily. Take one capsule every morning and evening, then take two capsules at bedtime  . [DISCONTINUED] metFORMIN (GLUCOPHAGE) 500 MG tablet Take 500 mg by mouth 2 (two) times daily with a meal.    . [DISCONTINUED] naproxen (NAPROSYN)  500 MG tablet Take 500 mg by mouth 2 (two) times daily.   No facility-administered encounter medications on file as of 06/10/2017.     ALLERGIES: Allergies  Allergen Reactions  . Bee Venom Shortness Of Breath, Itching and Swelling    VACCINATION STATUS: Immunization History  Administered Date(s) Administered  . Influenza Split 05/07/2013    Diabetes  She presents for her initial diabetic visit. She has type 2 diabetes mellitus. Onset time: She was diagnosed at approximate age of 24 years. Her disease course has been worsening. There are no hypoglycemic associated symptoms. Pertinent negatives for hypoglycemia include no confusion, headaches, pallor or seizures. Associated symptoms include blurred vision, fatigue, polydipsia and polyuria. Pertinent negatives for diabetes include no chest pain and no polyphagia. There are no hypoglycemic complications. Symptoms are worsening. There are no diabetic complications. Risk factors for coronary artery disease include dyslipidemia, diabetes mellitus, obesity, sedentary lifestyle and  post-menopausal. Current diabetic treatment includes oral agent (monotherapy). Her weight is increasing steadily. She is following a generally unhealthy diet. When asked about meal planning, she reported none. She has not had a previous visit with a dietitian. She never participates in exercise. (She did not bring any meter nor logs with her briefly today. Her recent A1c was 8.8% from 04/27/2017.) An ACE inhibitor/angiotensin II receptor blocker is not being taken. She does not see a podiatrist.Eye exam is not current.  Hyperlipidemia  This is a chronic problem. The current episode started more than 1 year ago. Exacerbating diseases include diabetes and obesity. Pertinent negatives include no chest pain, myalgias or shortness of breath. Risk factors for coronary artery disease include diabetes mellitus, dyslipidemia, obesity, a sedentary lifestyle and post-menopausal.      Review of Systems  Constitutional: Positive for fatigue. Negative for chills, fever and unexpected weight change.  HENT: Negative for trouble swallowing and voice change.   Eyes: Positive for blurred vision. Negative for visual disturbance.  Respiratory: Negative for cough, shortness of breath and wheezing.   Cardiovascular: Negative for chest pain, palpitations and leg swelling.  Gastrointestinal: Negative for diarrhea, nausea and vomiting.  Endocrine: Positive for polydipsia and polyuria. Negative for cold intolerance, heat intolerance and polyphagia.  Musculoskeletal: Negative for arthralgias and myalgias.  Skin: Negative for color change, pallor, rash and wound.  Neurological: Negative for seizures and headaches.  Psychiatric/Behavioral: Negative for confusion and suicidal ideas.    Objective:    BP 136/88 (BP Location: Right Arm, Patient Position: Sitting, Cuff Size: Normal)   Pulse 79   Wt 275 lb (124.7 kg)   LMP 03/19/2011   BMI 40.61 kg/m   Wt Readings from Last 3 Encounters:  06/10/17 275 lb (124.7 kg)   06/13/12 275 lb (124.7 kg)  05/15/11 290 lb (131.5 kg)     Physical Exam  Constitutional: She is oriented to person, place, and time. She appears well-developed.  HENT:  Head: Normocephalic and atraumatic.  Eyes: EOM are normal.  Neck: Normal range of motion. Neck supple. No tracheal deviation present. No thyromegaly present.  Cardiovascular: Normal rate and regular rhythm.  Pulmonary/Chest: Effort normal and breath sounds normal.  Abdominal: Soft. Bowel sounds are normal. There is no tenderness. There is no guarding.  Musculoskeletal: Normal range of motion. She exhibits no edema.  Neurological: She is alert and oriented to person, place, and time. She has normal reflexes. No cranial nerve deficit. Coordination normal.  Skin: Skin is warm and dry. No rash noted. No erythema. No pallor.  Psychiatric: She has a  normal mood and affect. Judgment normal.    Assessment & Plan:   1. Uncontrolled type 2 diabetes mellitus with hyperglycemia ([place])  - Tina Sampson has currently uncontrolled symptomatic type 2 DM since  56 years of age,  with most recent A1c of 8.8 %. Recent labs reviewed.  -her diabetes is complicated by obesity/sedentary life and Tina Sampson remains at a high risk for more acute and chronic complications which include CAD, CVA, CKD, retinopathy, and neuropathy. These are all discussed in detail with the patient.  - I have counseled her on diet management and weight loss, by adopting a carbohydrate restricted/protein rich diet.  - Suggestion is made for her to avoid simple carbohydrates  from her diet including Cakes, Sweet Desserts, Ice Cream, Soda (diet and regular), Sweet Tea, Candies, Chips, Cookies, Store Bought Juices, Alcohol in Excess of  1-2 drinks a day, Artificial Sweeteners, and "Sugar-free" Products. This will help patient to have stable blood glucose profile and potentially avoid unintended weight gain.  - I encouraged her to switch to  unprocessed or  minimally processed complex starch and increased protein intake (animal or plant source), fruits, and vegetables.  - she is advised to stick to a routine mealtimes to eat 3 meals  a day and avoid unnecessary snacks ( to snack only to correct hypoglycemia).   - she will be scheduled with Tina Sampson, RDN, CDE for individualized diabetes education.  - I have approached her with the following individualized plan to manage diabetes and patient agrees:   -  She is motivated and would like to maximize her lifestyle modification.  - I will delay the prescription for any additional medication until she returns with her meter and logs. I approached her to start   strict monitoring of glucose 4 times a day-before meals and at bedtime.  -Patient is encouraged to call clinic for blood glucose levels less than 70 or above 300 mg /dl. - In the meantime, I advised her to maximize metformin to 1000 mg by mouth twice a day, therapeutically suitable for patient . - she will be considered for incretin therapy as appropriate next visit. - Patient specific target  A1c;  LDL, HDL, Triglycerides, and  Waist Circumference were discussed in detail.  2) BP/HTN: Controlled. Patient is not on any antihypertensive medications. She will be considered for low-dose ACEI/ARB. 3) Lipids/HPL:  no recent lipid panel to review.   Patient is advised to continue statins. 4)  Weight/Diet: CDE Consult will be initiated , exercise, and detailed carbohydrates information provided.  5) Chronic Care/Health Maintenance:  -she  is on  Statin medications and  is encouraged to continue to follow up with Ophthalmology, Dentist,  Podiatrist at least yearly or according to recommendations, and advised to  stay away from smoking. I have recommended yearly flu vaccine and pneumonia vaccination at least every 5 years; moderate intensity exercise for up to 150 minutes weekly; and  sleep for at least 7 hours a day.  - Time spent with the  patient: 1 hour, of which >50% was spent in obtaining information about her symptoms, reviewing her previous labs, evaluations, and treatments, counseling her about her  currently uncontrolled type 2 diabetes, hyperlipidemia, obesity , and developing a plan for long term treatment; her  questions were answered to her satisfaction.  - Patient to bring meter and  blood glucose logs during her next visit.  - I advised patient to maintain close follow up with Deloria Lair., MD  for primary care needs.  Follow up plan: - Return in about 10 days (around 06/20/2017) for follow up with meter and logs- no labs.  Glade Lloyd, MD [facility] [address] [place], [place] [address] Phone: [phone]  Fax: [phone]    06/10/2017, 10:09 AM  This note was partially dictated with voice recognition software. Similar sounding words can be transcribed inadequately or may not  be corrected upon review.

## 2017-06-10 NOTE — Patient Instructions (Signed)

## 2017-06-22 ENCOUNTER — Ambulatory Visit (INDEPENDENT_AMBULATORY_CARE_PROVIDER_SITE_OTHER): Payer: Medicare HMO | Admitting: "Endocrinology

## 2017-06-22 ENCOUNTER — Encounter: Payer: Self-pay | Admitting: "Endocrinology

## 2017-06-22 VITALS — BP 148/90 | HR 89 | Wt 272.0 lb

## 2017-06-22 DIAGNOSIS — E782 Mixed hyperlipidemia: Secondary | ICD-10-CM | POA: Diagnosis not present

## 2017-06-22 DIAGNOSIS — E1165 Type 2 diabetes mellitus with hyperglycemia: Secondary | ICD-10-CM | POA: Diagnosis not present

## 2017-06-22 MED ORDER — LISINOPRIL-HYDROCHLOROTHIAZIDE 10-12.5 MG PO TABS: 1.0000 | ORAL_TABLET | Freq: Every day | ORAL | 3 refills | Status: DC

## 2017-06-22 NOTE — Patient Instructions (Signed)

## 2017-06-22 NOTE — Progress Notes (Signed)
Endocrinology follow-up Note       06/22/2017, 10:27 AM   Subjective:    Patient ID: Tina Sampson, female    DOB: 1960/12/24.  Tina Sampson is being seen in f/u  for management of currently uncontrolled symptomatic diabetes requested by  Deloria Lair., MD.   Past Medical History:  Diagnosis Date  . Diabetes mellitus   . Diabetes mellitus, type II ([place])   . H/O knee surgery   . Hyperlipidemia    Past Surgical History:  Procedure Laterality Date  . ANKLE SURGERY    . CARDIAC CATHETERIZATION    . catherization    . CERVICAL DISC SURGERY    . CESAREAN SECTION    . CHOLECYSTECTOMY    . KNEE SURGERY    . KNEE SURGERY    . TUBAL LIGATION     Social History   Socioeconomic History  . Marital status: Married    Spouse name: None  . Number of children: None  . Years of education: None  . Highest education level: None  Social Needs  . Financial resource strain: None  . Food insecurity - worry: None  . Food insecurity - inability: None  . Transportation needs - medical: None  . Transportation needs - non-medical: None  Occupational History  . None  Tobacco Use  . Smoking status: Never Smoker  . Smokeless tobacco: Never Used  Substance and Sexual Activity  . Alcohol use: No  . Drug use: No  . Sexual activity: Yes  Other Topics Concern  . None  Social History Narrative  . None   Outpatient Encounter Medications as of 06/22/2017  Medication Sig  . aspirin EC 81 MG tablet Take 81 mg daily by mouth.  . B Complex Vitamins (VITAMIN B COMPLEX PO) Take daily by mouth.  . gabapentin (NEURONTIN) 300 MG capsule Take 900 mg 2 (two) times daily by mouth.  Marland Kitchen lisinopril-hydrochlorothiazide (PRINZIDE,ZESTORETIC) 10-12.5 MG tablet Take 1 tablet daily by mouth.  . metFORMIN (GLUCOPHAGE) 500 MG tablet Take 2 tablets 2 (two) times daily with a meal by mouth. 1000 mg in the a.m & 1000 mg in the p.m.    . Multiple Vitamin (MULTIVITAMIN) tablet Take 1 tablet daily by mouth.  Marland Kitchen omeprazole (PRILOSEC) 20 MG capsule Take 20 mg daily by mouth.  . pramipexole (MIRAPEX) 0.125 MG tablet Take 0.125 mg by mouth at bedtime. For RLS  . pravastatin (PRAVACHOL) 40 MG tablet Take 40 mg by mouth daily.  . traZODone (DESYREL) 50 MG tablet Take 50 mg by mouth at bedtime.  Marland Kitchen venlafaxine (EFFEXOR) 75 MG tablet 37.5 mg 2 (two) times daily.   No facility-administered encounter medications on file as of 06/22/2017.     ALLERGIES: Allergies  Allergen Reactions  . Bee Venom Shortness Of Breath, Itching and Swelling    VACCINATION STATUS: Immunization History  Administered Date(s) Administered  . Influenza Split 05/07/2013    Diabetes  She presents for her follow-up diabetic visit. She has type 2 diabetes mellitus. Onset time: She was diagnosed at approximate age of 30 years. Her disease course has been improving. There are no hypoglycemic associated symptoms. Pertinent negatives  for hypoglycemia include no confusion, headaches, pallor or seizures. Associated symptoms include blurred vision, fatigue, polydipsia and polyuria. Pertinent negatives for diabetes include no chest pain and no polyphagia. There are no hypoglycemic complications. Symptoms are improving. There are no diabetic complications. Risk factors for coronary artery disease include dyslipidemia, diabetes mellitus, obesity, sedentary lifestyle and post-menopausal. Current diabetic treatment includes oral agent (monotherapy). Her weight is decreasing steadily. She is following a generally unhealthy diet. When asked about meal planning, she reported none. She has not had a previous visit with a dietitian. She never participates in exercise. (She did not bring any meter nor logs with her briefly today. Her recent A1c was 8.8% from 04/27/2017.) An ACE inhibitor/angiotensin II receptor blocker is being taken. She does not see a podiatrist.Eye exam is current  (She has not seen her ophthalmologist in 2 years, I urged to reschedule a visit.).  Hyperlipidemia  This is a chronic problem. The current episode started more than 1 year ago. Exacerbating diseases include diabetes and obesity. Pertinent negatives include no chest pain, myalgias or shortness of breath. Risk factors for coronary artery disease include diabetes mellitus, dyslipidemia, obesity, a sedentary lifestyle and post-menopausal.      Review of Systems  Constitutional: Positive for fatigue. Negative for chills, fever and unexpected weight change.  HENT: Negative for trouble swallowing and voice change.   Eyes: Positive for blurred vision. Negative for visual disturbance.  Respiratory: Negative for cough, shortness of breath and wheezing.   Cardiovascular: Negative for chest pain, palpitations and leg swelling.  Gastrointestinal: Negative for diarrhea, nausea and vomiting.  Endocrine: Positive for polydipsia and polyuria. Negative for cold intolerance, heat intolerance and polyphagia.  Musculoskeletal: Negative for arthralgias and myalgias.  Skin: Negative for color change, pallor, rash and wound.  Neurological: Negative for seizures and headaches.  Psychiatric/Behavioral: Negative for confusion and suicidal ideas.    Objective:    BP (!) 148/90   Pulse 89   Wt 272 lb (123.4 kg)   LMP 03/19/2011   BMI 40.17 kg/m   Wt Readings from Last 3 Encounters:  06/22/17 272 lb (123.4 kg)  06/10/17 275 lb (124.7 kg)  06/13/12 275 lb (124.7 kg)     Physical Exam  Constitutional: She is oriented to person, place, and time. She appears well-developed.  HENT:  Head: Normocephalic and atraumatic.  Poor dental condition.  Eyes: EOM are normal.  Neck: Normal range of motion. Neck supple. No tracheal deviation present. No thyromegaly present.  Cardiovascular: Normal rate and regular rhythm.  Pulmonary/Chest: Effort normal and breath sounds normal.  Abdominal: Soft. Bowel sounds are  normal. There is no tenderness. There is no guarding.  Musculoskeletal: Normal range of motion. She exhibits no edema.  Neurological: She is alert and oriented to person, place, and time. She has normal reflexes. No cranial nerve deficit. Coordination normal.  Skin: Skin is warm and dry. No rash noted. No erythema. No pallor.  Psychiatric: She has a normal mood and affect. Judgment normal.    Assessment & Plan:   1. Uncontrolled type 2 diabetes mellitus with hyperglycemia ([place])  - Tina Sampson has currently uncontrolled symptomatic type 2 DM since  56 years of age. - She came with better blood glucose profile allergy 169 in the last 7 days despite her recent A1c of 8.8%. Recent labs reviewed.  -her diabetes is complicated by obesity/sedentary life and Tina Sampson remains at a high risk for more acute and chronic complications which include CAD, CVA, CKD,  retinopathy, and neuropathy. These are all discussed in detail with the patient.  - I have counseled her on diet management and weight loss, by adopting a carbohydrate restricted/protein rich diet.  -  Suggestion is made for her to avoid simple carbohydrates  from her diet including Cakes, Sweet Desserts / Pastries, Ice Cream, Soda (diet and regular), Sweet Tea, Candies, Chips, Cookies, Store Bought Juices, Alcohol in Excess of  1-2 drinks a day, Artificial Sweeteners, and "Sugar-free" Products. This will help patient to have stable blood glucose profile and potentially avoid unintended weight gain.  - I encouraged her to switch to  unprocessed or minimally processed complex starch and increased protein intake (animal or plant source), fruits, and vegetables.  - she is advised to stick to a routine mealtimes to eat 3 meals  a day and avoid unnecessary snacks ( to snack only to correct hypoglycemia).   - I have approached her with the following individualized plan to manage diabetes and patient agrees:   -  She is motivated, lost 3  pounds in the last week,  and would like to maximize her lifestyle modification.  - I will delay the prescription for any additional medications for diabetes for now.  -Patient is encouraged to call clinic for blood glucose levels less than 70 or above 300 mg /dl. - In the meantime, I advised her to continue metformin 1000 mg by mouth twice a day, therapeutically suitable for patient .  - she will be considered for incretin therapy as appropriate next visit. - Patient specific target  A1c;  LDL, HDL, Triglycerides, and  Waist Circumference were discussed in detail.  2) BP/HTN: uncontrolled, blood pressure this morning at 148/90. I discussed in approached her for treatment and she is in agreement. I would initiate lisinopril 10 mg/HCTZ 12.5 mg by mouth daily.  3) Lipids/HPL:  no recent lipid panel to review.   Patient is advised to continue statins. 4)  Weight/Diet: CDE Consult has been initiated- consult pending , exercise, and detailed carbohydrates information provided.  5) Chronic Care/Health Maintenance:  -she  is on  Statin medications and  is encouraged to continue to follow up with Ophthalmology, Dentist,  Podiatrist at least yearly or according to recommendations, and advised to  stay away from smoking. I have recommended yearly flu vaccine and pneumonia vaccination at least every 5 years; moderate intensity exercise for up to 150 minutes weekly; and  sleep for at least 7 hours a day. - I advised her to seek referral to a dentist.   - I advised patient to maintain close follow up with Deloria Lair., MD for primary care needs.  - Time spent with the patient: 25 min, of which >50% was spent in reviewing her sugar logs , discussing her hypo- and hyper-glycemic episodes, reviewing her current and  previous labs and insulin doses and developing a plan to avoid hypo- and hyper-glycemia.   Follow up plan: - Return in about 7 weeks (around 08/10/2017) for follow up with pre-visit  labs.  Glade Lloyd, MD [facility] [address] [place], [place] [address] Phone: [phone]  Fax: [phone]    06/22/2017, 10:27 AM  This note was partially dictated with voice recognition software. Similar sounding words can be transcribed inadequately or may not  be corrected upon review.

## 2017-07-22 ENCOUNTER — Encounter: Payer: Self-pay | Admitting: Nutrition

## 2017-07-22 ENCOUNTER — Encounter: Payer: Medicare HMO | Attending: "Endocrinology | Admitting: Nutrition

## 2017-07-22 VITALS — Ht 69.0 in | Wt 276.0 lb

## 2017-07-22 DIAGNOSIS — E118 Type 2 diabetes mellitus with unspecified complications: Secondary | ICD-10-CM

## 2017-07-22 DIAGNOSIS — E1165 Type 2 diabetes mellitus with hyperglycemia: Secondary | ICD-10-CM | POA: Insufficient documentation

## 2017-07-22 DIAGNOSIS — IMO0002 Reserved for concepts with insufficient information to code with codable children: Secondary | ICD-10-CM

## 2017-07-22 DIAGNOSIS — Z713 Dietary counseling and surveillance: Secondary | ICD-10-CM | POA: Insufficient documentation

## 2017-07-22 DIAGNOSIS — E669 Obesity, unspecified: Secondary | ICD-10-CM

## 2017-07-22 NOTE — Progress Notes (Signed)
  Medical Nutrition Therapy:  Appt start time: 0800 end time:  0900. Assessment:  Primary concerns today: Diabetes. Type 2. Has had for 10-15 yrs. Lives with her husband. She does most cooking and shopping. Most foods are fried.  Sees Dr. Dorris Fetch for Endocrinology. Most A1C 8.8%.  Has a time sleeping; going to sleep and staying asleep. Takes Trazadone for that. Is currently on Effexor for Depression. Has lost a lot of family members and admits to emotionally eating due to depression. Willing to see a counselor to help with emotional issues. Carious teeth. ON meds for cholesterol and HTN. Eats 2 meals per day Metformin 1000 mg BID. Wt has been the same for 10 yrs.  Uses pedometer 2000 steps a day. Checks her BS and most ar avging 180-200's. Diet is excessive in calories, fat, sodium and low in fresh fruits and vegetables.   She is motivated and engaged to make changes with food choices and work on weight loss and improving diabetes.  CMP Latest Ref Rng & Units 04/27/2017  BUN 4 - 21 9  Creatinine 0.5 - 1.1 0.8   Lab Results  Component Value Date   HGBA1C 8.8 04/27/2017      Preferred Learning Style:   No preference indicated   Learning Readiness:   Ready  Change in progress   MEDICATIONS: See list  DIETARY INTAKE:   24-hr recall:  B ( AM): skips or chicken mini combo-ate only chicken nuggets. Drinks Regular rootbeer Snk ( AM): ritz chips, french onion chip dip  L ( PM): skipped   Snk ( PM):  D ( PM): [employer] chicken breast, 1/4 c mashed potatoes, maccheese , biscuit and 1/4 c green beans, and piece of cheesecake,  Strawberry pound cake. Snk ( PM): Beverages: Pepsi max, diet soda, flavored water.  Usual physical activity:  ADL  Estimated energy needs: 1500  calories 170 g carbohydrates 112 g protein 42 g fat  Progress Towards Goal(s):  In progress.   Nutritional Diagnosis:  NB-1.1 Food and nutrition-related knowledge deficit As related to Diabetes, Obesity.  As  evidenced by A1C 8.8 and BMI> 40.    Intervention: Nutrition and Diabetes education provided on My Plate, CHO counting, meal planning, portion sizes, timing of meals, avoiding snacks between meals unless having a low blood sugar, target ranges for A1C and blood sugars, signs/symptoms and treatment of hyper/hypoglycemia, monitoring blood sugars, taking medications as prescribed, benefits of exercising 30 minutes per day and prevention of complications of DM.  Goals 1. Follow My Plate 2. Use grocery shopper for meal and buying on sale 3. Bake and grill meats 4. Cut out fried foods 5. Cut out fast foods 6. Drink water only Exercise 15-30 minutes daily. Talk to MD about counseling for grief and loss issues.   Teaching Method Utilized:  Visual Auditory Hands on  Handouts given during visit include:  The Plate Method   Meal Plan Card  Diabetes Instrucitons.   Barriers to learning/adherence to lifestyle change: none  Demonstrated degree of understanding via:  Teach Back   Monitoring/Evaluation:  Dietary intake, exercise, meal planning, sbg, and body weight in 2 month(s).  Recommend referral to Grief Counsellor or behavior health for depressionggrief of losing family members.

## 2017-07-22 NOTE — Patient Instructions (Addendum)
Goals 1. Follow My Plate 2. Use grocery shopper for meal and buying on sale 3. Bake and grill meats 4. Cut out fried foods 5. Cut out fast foods 6. Drink water only Exercise 15-30 minutes daily. Talk to MD about counseling for grief and loss issues.

## 2017-08-06 LAB — COMPLETE METABOLIC PANEL WITH GFR
AG RATIO: 1.3 (calc) (ref 1.0–2.5)
ALKALINE PHOSPHATASE (APISO): 97 U/L (ref 33–130)
ALT: 17 U/L (ref 6–29)
AST: 19 U/L (ref 10–35)
Albumin: 3.8 g/dL (ref 3.6–5.1)
BILIRUBIN TOTAL: 0.5 mg/dL (ref 0.2–1.2)
BUN: 11 mg/dL (ref 7–25)
CHLORIDE: 105 mmol/L (ref 98–110)
CO2: 25 mmol/L (ref 20–32)
Calcium: 9.1 mg/dL (ref 8.6–10.4)
Creat: 0.81 mg/dL (ref 0.50–1.05)
GFR, Est African American: 93 mL/min/{1.73_m2} (ref >=60)
GFR, Est Non African American: 80 mL/min/{1.73_m2} (ref >=60)
Globulin: 2.9 g/dL (calc) (ref 1.9–3.7)
Glucose, Bld: 200 mg/dL — ABNORMAL HIGH (ref 65–99)
POTASSIUM: 4.2 mmol/L (ref 3.5–5.3)
Sodium: 140 mmol/L (ref 135–146)
Total Protein: 6.7 g/dL (ref 6.1–8.1)

## 2017-08-06 LAB — LIPID PANEL
Cholesterol: 130 mg/dL (ref <200)
HDL: 39 mg/dL — ABNORMAL LOW (ref >50)
LDL CHOLESTEROL (CALC): 66 mg/dL
Non-HDL Cholesterol (Calc): 91 mg/dL (calc) (ref <130)
Total CHOL/HDL Ratio: 3.3 (calc) (ref <5.0)
Triglycerides: 177 mg/dL — ABNORMAL HIGH (ref <150)

## 2017-08-06 LAB — T4, FREE: FREE T4: 1 ng/dL (ref 0.8–1.8)

## 2017-08-06 LAB — HEMOGLOBIN A1C
EAG (MMOL/L): 10.1 (calc)
HEMOGLOBIN A1C: 8 %{Hb} — AB (ref <5.7)
MEAN PLASMA GLUCOSE: 183 (calc)

## 2017-08-06 LAB — MICROALBUMIN / CREATININE URINE RATIO
Creatinine, Urine: 106 mg/dL (ref 20–275)
MICROALB UR: 1.4 mg/dL
Microalb Creat Ratio: 13 mcg/mg creat (ref <30)

## 2017-08-06 LAB — VITAMIN D 25 HYDROXY (VIT D DEFICIENCY, FRACTURES): VIT D 25 HYDROXY: 28 ng/mL — AB (ref 30–100)

## 2017-08-06 LAB — TSH: TSH: 1.72 m[IU]/L (ref 0.40–4.50)

## 2017-08-11 ENCOUNTER — Ambulatory Visit: Payer: Medicare HMO | Admitting: "Endocrinology

## 2017-08-11 ENCOUNTER — Encounter: Payer: Self-pay | Admitting: "Endocrinology

## 2017-08-11 VITALS — BP 113/72 | HR 71 | Ht 69.0 in | Wt 274.0 lb

## 2017-08-11 DIAGNOSIS — E782 Mixed hyperlipidemia: Secondary | ICD-10-CM | POA: Diagnosis not present

## 2017-08-11 DIAGNOSIS — E1165 Type 2 diabetes mellitus with hyperglycemia: Secondary | ICD-10-CM

## 2017-08-11 MED ORDER — DULAGLUTIDE 0.75 MG/0.5ML ~~LOC~~ SOAJ: 0.7500 mg | SUBCUTANEOUS | 2 refills | Status: DC

## 2017-08-11 NOTE — Patient Instructions (Signed)

## 2017-08-11 NOTE — Progress Notes (Signed)
Endocrinology follow-up Note       08/11/2017, 10:12 AM   Subjective:    Patient ID: Tina Sampson, female    DOB: 1961/05/30.  Tina Sampson is being seen in follow-up  for management of currently uncontrolled symptomatic diabetes requested by  Deloria Lair., MD.   Past Medical History:  Diagnosis Date  . Diabetes mellitus   . Diabetes mellitus, type II ([place])   . H/O knee surgery   . Hyperlipidemia    Past Surgical History:  Procedure Laterality Date  . ANKLE SURGERY    . CARDIAC CATHETERIZATION    . catherization    . CERVICAL DISC SURGERY    . CESAREAN SECTION    . CHOLECYSTECTOMY    . KNEE SURGERY    . KNEE SURGERY    . TUBAL LIGATION     Social History   Socioeconomic History  . Marital status: Married    Spouse name: None  . Number of children: None  . Years of education: None  . Highest education level: None  Social Needs  . Financial resource strain: None  . Food insecurity - worry: None  . Food insecurity - inability: None  . Transportation needs - medical: None  . Transportation needs - non-medical: None  Occupational History  . None  Tobacco Use  . Smoking status: Never Smoker  . Smokeless tobacco: Never Used  Substance and Sexual Activity  . Alcohol use: No  . Drug use: No  . Sexual activity: Yes  Other Topics Concern  . None  Social History Narrative  . None   Outpatient Encounter Medications as of 08/11/2017  Medication Sig  . aspirin EC 81 MG tablet Take 81 mg daily by mouth.  . B Complex Vitamins (VITAMIN B COMPLEX PO) Take daily by mouth.  . Dulaglutide (TRULICITY) 9.56 OZ/3.0QM SOPN Inject 0.75 mg into the skin once a week.  . gabapentin (NEURONTIN) 300 MG capsule Take 900 mg 2 (two) times daily by mouth.  Marland Kitchen lisinopril-hydrochlorothiazide (PRINZIDE,ZESTORETIC) 10-12.5 MG tablet Take 1 tablet daily by mouth.  . metFORMIN (GLUCOPHAGE) 500 MG tablet Take 2  tablets 2 (two) times daily with a meal by mouth. 1000 mg in the a.m & 1000 mg in the p.m.   . Multiple Vitamin (MULTIVITAMIN) tablet Take 1 tablet daily by mouth.  Marland Kitchen omeprazole (PRILOSEC) 20 MG capsule Take 20 mg daily by mouth.  . pramipexole (MIRAPEX) 0.125 MG tablet Take 0.125 mg by mouth at bedtime. For RLS  . pravastatin (PRAVACHOL) 40 MG tablet Take 40 mg by mouth daily.  . traZODone (DESYREL) 50 MG tablet Take 50 mg by mouth at bedtime.  Marland Kitchen venlafaxine (EFFEXOR) 75 MG tablet 37.5 mg 2 (two) times daily.   No facility-administered encounter medications on file as of 08/11/2017.     ALLERGIES: Allergies  Allergen Reactions  . Bee Venom Shortness Of Breath, Itching and Swelling    VACCINATION STATUS: Immunization History  Administered Date(s) Administered  . Influenza [place] 05/07/2013    Diabetes  She presents for her follow-up diabetic visit. She has type 2 diabetes mellitus. Onset time: She was diagnosed at approximate age of  57 years. Her disease course has been improving. There are no hypoglycemic associated symptoms. Pertinent negatives for hypoglycemia include no confusion, headaches, pallor or seizures. Associated symptoms include blurred vision and fatigue. Pertinent negatives for diabetes include no chest pain, no polydipsia, no polyphagia and no polyuria. There are no hypoglycemic complications. Symptoms are improving. There are no diabetic complications. Risk factors for coronary artery disease include dyslipidemia, diabetes mellitus, obesity, sedentary lifestyle and post-menopausal. Current diabetic treatment includes oral agent (monotherapy). Her weight is increasing steadily. She is following a generally unhealthy diet. When asked about meal planning, she reported none. She has not had a previous visit with a dietitian. She never participates in exercise. Her overall blood glucose range is 180-200 mg/dl. An ACE inhibitor/angiotensin II receptor blocker is being taken. She does  not see a podiatrist.Eye exam is current (She has not seen her ophthalmologist in 2 years, I urged to reschedule a visit.).  Hyperlipidemia  This is a chronic problem. The current episode started more than 1 year ago. The problem is controlled. Exacerbating diseases include diabetes and obesity. Pertinent negatives include no chest pain, myalgias or shortness of breath. Current antihyperlipidemic treatment includes statins. Risk factors for coronary artery disease include diabetes mellitus, dyslipidemia, obesity, a sedentary lifestyle and post-menopausal.    Review of Systems  Constitutional: Positive for fatigue. Negative for chills, fever and unexpected weight change.  HENT: Negative for trouble swallowing and voice change.   Eyes: Positive for blurred vision. Negative for visual disturbance.  Respiratory: Negative for cough, shortness of breath and wheezing.   Cardiovascular: Negative for chest pain, palpitations and leg swelling.  Gastrointestinal: Negative for diarrhea, nausea and vomiting.  Endocrine: Negative for cold intolerance, heat intolerance, polydipsia, polyphagia and polyuria.  Musculoskeletal: Negative for arthralgias and myalgias.  Skin: Negative for color change, pallor, rash and wound.  Neurological: Negative for seizures and headaches.  Psychiatric/Behavioral: Negative for confusion and suicidal ideas.    Objective:    BP 113/72   Pulse 71   Ht 5\' 9"  (1.753 m)   Wt 274 lb (124.3 kg)   LMP 03/19/2011   BMI 40.46 kg/m   Wt Readings from Last 3 Encounters:  08/11/17 274 lb (124.3 kg)  07/22/17 276 lb (125.2 kg)  06/22/17 272 lb (123.4 kg)     Physical Exam  Constitutional: She is oriented to person, place, and time. She appears well-developed.  HENT:  Head: Normocephalic and atraumatic.  Poor dental condition.  Eyes: EOM are normal.  Neck: Normal range of motion. Neck supple. No tracheal deviation present. No thyromegaly present.  Cardiovascular: Normal rate  and regular rhythm.  Pulmonary/Chest: Effort normal and breath sounds normal.  Abdominal: Soft. Bowel sounds are normal. There is no tenderness. There is no guarding.  Musculoskeletal: Normal range of motion. She exhibits no edema.  Neurological: She is alert and oriented to person, place, and time. She has normal reflexes. No cranial nerve deficit. Coordination normal.  Skin: Skin is warm and dry. No rash noted. No erythema. No pallor.  Psychiatric: She has a normal mood and affect. Judgment normal.   Recent Results (from the past 2160 hour(s))  COMPLETE METABOLIC PANEL WITH GFR     Status: Abnormal   Collection Time: 08/05/17 11:33 AM  Result Value Ref Range   Glucose, Bld 200 (H) 65 - 99 mg/dL    Comment: .            Fasting reference interval . For someone without known diabetes, a glucose  value >125 mg/dL indicates that they may have diabetes and this should be confirmed with a follow-up test. .    BUN 11 7 - 25 mg/dL   Creat 0.81 0.50 - 1.05 mg/dL    Comment: For patients >80 years of age, the reference limit for Creatinine is approximately 13% higher for people identified as African-American. .    GFR, Est Non African American 80 > OR = 60 mL/min/1.77m2   GFR, Est African American 93 > OR = 60 mL/min/1.76m2   BUN/Creatinine Ratio NOT APPLICABLE 6 - 22 (calc)   Sodium 140 135 - 146 mmol/L   Potassium 4.2 3.5 - 5.3 mmol/L   Chloride 105 98 - 110 mmol/L   CO2 25 20 - 32 mmol/L   Calcium 9.1 8.6 - 10.4 mg/dL   Total Protein 6.7 6.1 - 8.1 g/dL   Albumin 3.8 3.6 - 5.1 g/dL   Globulin 2.9 1.9 - 3.7 g/dL (calc)   AG Ratio 1.3 1.0 - 2.5 (calc)   Total Bilirubin 0.5 0.2 - 1.2 mg/dL   Alkaline phosphatase (APISO) 97 33 - 130 U/L   AST 19 10 - 35 U/L   ALT 17 6 - 29 U/L  Hemoglobin A1c     Status: Abnormal   Collection Time: 08/05/17 11:33 AM  Result Value Ref Range   Hgb A1c MFr Bld 8.0 (H) <5.7 % of total Hgb    Comment: For someone without known diabetes, a hemoglobin  A1c value of 6.5% or greater indicates that they may have  diabetes and this should be confirmed with a follow-up  test. . For someone with known diabetes, a value <7% indicates  that their diabetes is well controlled and a value  greater than or equal to 7% indicates suboptimal  control. A1c targets should be individualized based on  duration of diabetes, age, comorbid conditions, and  other considerations. . Currently, no consensus exists regarding use of hemoglobin A1c for diagnosis of diabetes for children. .    Mean Plasma Glucose 183 (calc)   eAG (mmol/L) 10.1 (calc)  Lipid panel     Status: Abnormal   Collection Time: 08/05/17 11:33 AM  Result Value Ref Range   Cholesterol 130 <200 mg/dL   HDL 39 (L) >50 mg/dL   Triglycerides 177 (H) <150 mg/dL   LDL Cholesterol (Calc) 66 mg/dL (calc)    Comment: Reference range: <100 . Desirable range <100 mg/dL for primary prevention;   <70 mg/dL for patients with CHD or diabetic patients  with > or = 2 CHD risk factors. Marland Kitchen LDL-C is now calculated using the Martin-Hopkins  calculation, which is a validated novel method providing  better accuracy than the Friedewald equation in the  estimation of LDL-C.  Cresenciano Genre et al. Annamaria Helling. 7782;423(53): 2061-2068  (http://education.[facility].com/faq/FAQ164)    Total CHOL/HDL Ratio 3.3 <5.0 (calc)   Non-HDL Cholesterol (Calc) 91 <130 mg/dL (calc)    Comment: For patients with diabetes plus 1 major ASCVD risk  factor, treating to a non-HDL-C goal of <100 mg/dL  (LDL-C of <70 mg/dL) is considered a therapeutic  option.   VITAMIN D 25 Hydroxy (Vit-D Deficiency, Fractures)     Status: Abnormal   Collection Time: 08/05/17 11:33 AM  Result Value Ref Range   Vit D, 25-Hydroxy 28 (L) 30 - 100 ng/mL    Comment: Vitamin D Status         25-OH Vitamin D: . Deficiency:                    <  20 ng/mL Insufficiency:             20 - 29 ng/mL Optimal:                 > or = 30 ng/mL . For 25-OH  Vitamin D testing on patients on  D2-supplementation and patients for whom quantitation  of D2 and D3 fractions is required, the QuestAssureD(TM) 25-OH VIT D, (D2,D3), LC/MS/MS is recommended: order  code [id number] (patients >59yrs). . For more information on this test, go to: http://education.[facility].com/faq/FAQ163 (This link is being provided for  informational/educational purposes only.)   T4, free     Status: None   Collection Time: 08/05/17 11:33 AM  Result Value Ref Range   Free T4 1.0 0.8 - 1.8 ng/dL  TSH     Status: None   Collection Time: 08/05/17 11:33 AM  Result Value Ref Range   TSH 1.72 0.40 - 4.50 mIU/L  Microalbumin / creatinine urine ratio     Status: None   Collection Time: 08/05/17 11:33 AM  Result Value Ref Range   Creatinine, Urine 106 20 - 275 mg/dL   Microalb, Ur 1.4 mg/dL    Comment: Reference Range Not established    Microalb Creat Ratio 13 <30 mcg/mg creat    Comment: . The ADA defines abnormalities in albumin excretion as follows: Marland Kitchen Category         Result (mcg/mg creatinine) . Normal                    <30 Microalbuminuria         30-299  Clinical albuminuria   > OR = 300 . The ADA recommends that at least two of three specimens collected within a 3-6 month period be abnormal before considering a patient to be within a diagnostic category.      Assessment & Plan:   1. Uncontrolled type 2 diabetes mellitus with hyperglycemia ([place])  - Tina Sampson has currently uncontrolled symptomatic type 2 DM since  57 years of age. - She came with better blood  fasting blood glucose profile and A1c 8% improving from 8.8% during last visit.   Recent labs reviewed.  -her diabetes is complicated by obesity/sedentary life and NORMAN BIER remains at a high risk for more acute and chronic complications which include CAD, CVA, CKD, retinopathy, and neuropathy. These are all discussed in detail with the patient.  - I have counseled her on diet  management and weight loss, by adopting a carbohydrate restricted/protein rich diet.  -  Suggestion is made for her to avoid simple carbohydrates  from her diet including Cakes, Sweet Desserts / Pastries, Ice Cream, Soda (diet and regular), Sweet Tea, Candies, Chips, Cookies, Store Bought Juices, Alcohol in Excess of  1-2 drinks a day, Artificial Sweeteners, and "Sugar-free" Products. This will help patient to have stable blood glucose profile and potentially avoid unintended weight gain.  - I encouraged her to switch to  unprocessed or minimally processed complex starch and increased protein intake (animal or plant source), fruits, and vegetables.  - she is advised to stick to a routine mealtimes to eat 3 meals  a day and avoid unnecessary snacks ( to snack only to correct hypoglycemia).   - I have approached her with the following individualized plan to manage diabetes and patient agrees:  -I advised her to continue metformin 1000 mg by mouth twice a day, therapeutically suitable for patient . - She will need additional therapy  in order for her to achieve control of diabetes to target. I discussed and initiated weekly Trulicity 6.30 mg subcutaneously to advance as tolerated.  - Patient specific target  A1c;  LDL, HDL, Triglycerides, and  Waist Circumference were discussed in detail.  2) BP/HTN: Controlled to target. I   advised her to continue lisinopril 10 mg/HCTZ 12.5 mg by mouth daily.  3) Lipids/HPL:   Her fasting lipid panel shows LDL at 66.Patient is advised to continue statins. 4)  Weight/Diet: CDE Consult has been initiated- consult pending , exercise, and detailed carbohydrates information provided.  5) Chronic Care/Health Maintenance:  -she  is on  Statin medications and   lisinopril is encouraged to continue to follow up with Ophthalmology, Dentist,  Podiatrist at least yearly or according to recommendations, and advised to  stay away from smoking. I have recommended yearly flu  vaccine and pneumonia vaccination at least every 5 years; moderate intensity exercise for up to 150 minutes weekly; and  sleep for at least 7 hours a day. - I advised her to seek referral to a dentist.   - I advised patient to maintain close follow up with Deloria Lair., MD for primary care needs.  Follow up plan: - No Follow-up on file.  Glade Lloyd, MD [facility] [address] [place], [place] [address] Phone: [phone]  Fax: [phone]    08/11/2017, 10:12 AM  This note was partially dictated with voice recognition software. Similar sounding words can be transcribed inadequately or may not  be corrected upon review.

## 2017-09-03 ENCOUNTER — Encounter: Payer: Medicare HMO | Attending: Family Medicine | Admitting: Nutrition

## 2017-09-03 VITALS — Ht 69.0 in | Wt 272.0 lb

## 2017-09-03 DIAGNOSIS — E1165 Type 2 diabetes mellitus with hyperglycemia: Secondary | ICD-10-CM | POA: Insufficient documentation

## 2017-09-03 DIAGNOSIS — Z713 Dietary counseling and surveillance: Secondary | ICD-10-CM | POA: Insufficient documentation

## 2017-09-03 DIAGNOSIS — IMO0002 Reserved for concepts with insufficient information to code with codable children: Secondary | ICD-10-CM

## 2017-09-03 DIAGNOSIS — E669 Obesity, unspecified: Secondary | ICD-10-CM

## 2017-09-03 DIAGNOSIS — E118 Type 2 diabetes mellitus with unspecified complications: Secondary | ICD-10-CM

## 2017-09-03 NOTE — Progress Notes (Signed)
  Medical Nutrition Therapy:  Appt start time: 1000 end time:  1030Assessment:  Primary concerns today: Diabetes. Type 2.   Has been keeping food journal and BS log. Now attending DM classes at [facility] with a CDE.  Lost 4 lbs. Counting carbs better and watching portion sizes. Exercising some.  Metformin 1000 mg BID. Making slow and stead progress. BS avg 150's now. Cut out junk food and junk snacks. Diet is improving.   CMP Latest Ref Rng & Units 08/05/2017 04/27/2017  Glucose 65 - 99 mg/dL 200(H) -  BUN 7 - 25 mg/dL 11 9  Creatinine 0.50 - 1.05 mg/dL 0.81 0.8  Sodium 135 - 146 mmol/L 140 -  Potassium 3.5 - 5.3 mmol/L 4.2 -  Chloride 98 - 110 mmol/L 105 -  CO2 20 - 32 mmol/L 25 -  Calcium 8.6 - 10.4 mg/dL 9.1 -  Total Protein 6.1 - 8.1 g/dL 6.7 -  Total Bilirubin 0.2 - 1.2 mg/dL 0.5 -  AST 10 - 35 U/L 19 -  ALT 6 - 29 U/L 17 -   Lab Results  Component Value Date   HGBA1C 8.0 (H) 08/05/2017      Preferred Learning Style:   No preference indicated   Learning Readiness:   Ready  Change in progress   MEDICATIONS: See list  DIETARY INTAKE:   24-hr recall:  B ( AM): eggs, oatmeal or cereal, water Snk ( AM):  L ( PM): Salad or sandwich, water, fruit  Snk ( PM):  D ( PM): Meat and veggies, water Snk ( PM): fruit sometimes. Beverages: water Usual physical activity:  ADL  Estimated energy needs: 1500  calories 170 g carbohydrates 112 g protein 42 g fat  Progress Towards Goal(s):  In progress.   Nutritional Diagnosis:  NB-1.1 Food and nutrition-related knowledge deficit As related to Diabetes, Obesity.  As evidenced by A1C 8.8 and BMI> 40.    Intervention: Nutrition and Diabetes education provided on My Plate, CHO counting, meal planning, portion sizes, timing of meals, avoiding snacks between meals unless having a low blood sugar, target ranges for A1C and blood sugars, signs/symptoms and treatment of hyper/hypoglycemia, monitoring blood sugars, taking  medications as prescribed, benefits of exercising 30 minutes per day and prevention of complications of DM.  Goals 1. Work on portions and balanced meals with  More vegetables. 2. Exercise [employer] 30 minutes three times per week. 3. Increase to more lower carb vegetables.  4. Lose 2-3 lbs per month  Teaching Method Utilized:  Visual Auditory Hands on  Handouts given during visit include:  The Plate Method   Meal Plan Card  Diabetes Instrucitons.   Barriers to learning/adherence to lifestyle change: none  Demonstrated degree of understanding via:  Teach Back   Monitoring/Evaluation:  Dietary intake, exercise, meal planning, sbg, and body weight in 3 month(s).

## 2017-09-03 NOTE — Patient Instructions (Signed)
Goals 1. Work on portions and balanced meals with  More vegetables. 2. Exercise [employer] 30 minutes three times per week. 3. Increase to more lower carb vegetables.  4. Lose 2-3 lbs per month

## 2017-09-09 ENCOUNTER — Encounter: Payer: Self-pay | Admitting: Nutrition

## 2017-10-15 ENCOUNTER — Ambulatory Visit (INDEPENDENT_AMBULATORY_CARE_PROVIDER_SITE_OTHER): Payer: Medicare HMO | Admitting: Internal Medicine

## 2017-10-15 ENCOUNTER — Encounter (INDEPENDENT_AMBULATORY_CARE_PROVIDER_SITE_OTHER): Payer: Self-pay | Admitting: Internal Medicine

## 2017-10-15 ENCOUNTER — Encounter (INDEPENDENT_AMBULATORY_CARE_PROVIDER_SITE_OTHER): Payer: Self-pay

## 2017-10-15 ENCOUNTER — Encounter (INDEPENDENT_AMBULATORY_CARE_PROVIDER_SITE_OTHER): Payer: Self-pay | Admitting: *Deleted

## 2017-10-15 VITALS — BP 138/70 | HR 80 | Temp 98.2°F | Ht 67.0 in | Wt 274.6 lb

## 2017-10-15 DIAGNOSIS — R109 Unspecified abdominal pain: Secondary | ICD-10-CM

## 2017-10-15 DIAGNOSIS — R1031 Right lower quadrant pain: Secondary | ICD-10-CM

## 2017-10-15 DIAGNOSIS — R1011 Right upper quadrant pain: Secondary | ICD-10-CM

## 2017-10-15 HISTORY — DX: Unspecified abdominal pain: R10.9

## 2017-10-15 LAB — HEPATIC FUNCTION PANEL
AG RATIO: 1.4 (calc) (ref 1.0–2.5)
ALT: 18 U/L (ref 6–29)
AST: 19 U/L (ref 10–35)
Albumin: 4.1 g/dL (ref 3.6–5.1)
Alkaline phosphatase (APISO): 98 U/L (ref 33–130)
BILIRUBIN DIRECT: 0.1 mg/dL (ref 0.0–0.2)
BILIRUBIN TOTAL: 0.4 mg/dL (ref 0.2–1.2)
GLOBULIN: 2.9 g/dL (ref 1.9–3.7)
Indirect Bilirubin: 0.3 mg/dL (calc) (ref 0.2–1.2)
Total Protein: 7 g/dL (ref 6.1–8.1)

## 2017-10-15 NOTE — Patient Instructions (Signed)
Labs and US abdomen.  

## 2017-10-15 NOTE — Progress Notes (Signed)
Subjective:    Patient ID: Tina Sampson, female    DOB: 06-03-1961, 57 y.o.   MRN: [id number]  HPI  Referred by Dr. Wynona Meals for generalized abdominal pain. She says she had same pain 3 yrs ago. The pain 3 yrs ago would come and go. Now she says the pain is steady. She says she has pain RLQ and RUQ. If she lays on her left side, she becomes nauseated. She has weight loss (4 pounds) which was intentional). Her appetite is good. She has a BM x 2 a day.  She rates the pain 4/10.  She has been seen at the [facility] for abnormal CT in July of 2018. PT scan was ordered. 06/15/2017 Colonoscopy: Colon mucosa was normal.  One sessile polyp in transverse colon. Biopsy Tubular adenoma.   07/01/2017 total bili 0.3, ALP 112, AST 25, ALT 21. H and H 12.0 and 38.5    03/03/2017 CT abdomen/pelvis with CM (abdominal pain x 2 months).  Upper normal spleen size an mild upper abdominal adenopathy raises the possibility of a lymphoproliferative disorder. Post cholecystectomy. Left nephrolithiasis.   Hx of diabetes since 2011.   Review of Systems  Past Medical History:  Diagnosis Date  . Abdominal pain 10/15/2017  . Diabetes mellitus   . Diabetes mellitus, type II ([place])   . H/O knee surgery   . Hyperlipidemia     Past Surgical History:  Procedure Laterality Date  . ANKLE SURGERY    . CARDIAC CATHETERIZATION    . catherization    . CERVICAL DISC SURGERY    . CESAREAN SECTION    . CHOLECYSTECTOMY    . KNEE SURGERY    . KNEE SURGERY    . TUBAL LIGATION      Allergies  Allergen Reactions  . Bee Venom Shortness Of Breath, Itching and Swelling    Current Outpatient Medications on File Prior to Visit  Medication Sig Dispense Refill  . aspirin EC 81 MG tablet Take 81 mg daily by mouth.    . B Complex Vitamins (VITAMIN B COMPLEX PO) Take daily by mouth.    . Cinnamon 500 MG capsule Take 1,000 mg by mouth daily.    . Dulaglutide (TRULICITY) 4.23 NT/6.1WE SOPN Inject 0.75  mg into the skin once a week. 4 pen 2  . gabapentin (NEURONTIN) 300 MG capsule Take 900 mg 2 (two) times daily by mouth.    Marland Kitchen lisinopril-hydrochlorothiazide (PRINZIDE,ZESTORETIC) 10-12.5 MG tablet Take 1 tablet daily by mouth. 90 tablet 3  . metFORMIN (GLUCOPHAGE) 500 MG tablet Take 2 tablets 2 (two) times daily with a meal by mouth. 1000 mg in the a.m & 1000 mg in the p.m.     . Multiple Vitamin (MULTIVITAMIN) tablet Take 1 tablet daily by mouth.    Marland Kitchen omeprazole (PRILOSEC) 20 MG capsule Take 20 mg daily by mouth.    . pramipexole (MIRAPEX) 0.125 MG tablet Take 0.125 mg by mouth at bedtime. For RLS    . pravastatin (PRAVACHOL) 40 MG tablet Take 40 mg by mouth daily.    . pravastatin (PRAVACHOL) 40 MG tablet Take 40 mg by mouth daily.    . traZODone (DESYREL) 50 MG tablet Take 50 mg by mouth at bedtime.    Marland Kitchen venlafaxine (EFFEXOR) 75 MG tablet 37.5 mg 2 (two) times daily.     No current facility-administered medications on file prior to visit.         Objective:   Physical Exam  Blood pressure 138/70, pulse 80, temperature 98.2 F (36.8 C), height 5\' 7"  (1.702 m), weight 274 lb 9.6 oz (124.6 kg), last menstrual period 03/19/2011.  Alert and oriented. Skin warm and dry. Oral mucosa is moist.   . Sclera anicteric, conjunctivae is pink. Thyroid not enlarged. No cervical lymphadenopathy. Lungs clear. Heart regular rate and rhythm.  Abdomen is soft. Bowel sounds are positive. No hepatomegaly. No abdominal masses felt. No tenderness.  No edema to lower extremities.          Assessment & Plan:  RLQ pain and RUQ pain. Am going to get an [place] and Hepatic function

## 2017-10-20 ENCOUNTER — Ambulatory Visit (HOSPITAL_COMMUNITY): Payer: Medicare HMO

## 2017-10-22 ENCOUNTER — Other Ambulatory Visit (INDEPENDENT_AMBULATORY_CARE_PROVIDER_SITE_OTHER): Payer: Self-pay | Admitting: Internal Medicine

## 2017-10-22 ENCOUNTER — Ambulatory Visit (HOSPITAL_COMMUNITY)
Admission: RE | Admit: 2017-10-22 | Discharge: 2017-10-22 | Disposition: A | Discharge status: Home / Self Care | Payer: Medicare HMO | Source: Ambulatory Visit | Attending: Internal Medicine | Admitting: Internal Medicine

## 2017-10-22 DIAGNOSIS — K760 Fatty (change of) liver, not elsewhere classified: Secondary | ICD-10-CM | POA: Insufficient documentation

## 2017-10-22 DIAGNOSIS — R1031 Right lower quadrant pain: Secondary | ICD-10-CM | POA: Diagnosis not present

## 2017-10-22 DIAGNOSIS — R161 Splenomegaly, not elsewhere classified: Secondary | ICD-10-CM | POA: Insufficient documentation

## 2017-10-22 DIAGNOSIS — R1011 Right upper quadrant pain: Secondary | ICD-10-CM | POA: Insufficient documentation

## 2017-10-22 DIAGNOSIS — Z9049 Acquired absence of other specified parts of digestive tract: Secondary | ICD-10-CM | POA: Diagnosis not present

## 2017-10-26 ENCOUNTER — Telehealth (INDEPENDENT_AMBULATORY_CARE_PROVIDER_SITE_OTHER): Payer: Self-pay | Admitting: Internal Medicine

## 2017-10-26 DIAGNOSIS — R1011 Right upper quadrant pain: Secondary | ICD-10-CM

## 2017-10-26 DIAGNOSIS — R1031 Right lower quadrant pain: Secondary | ICD-10-CM

## 2017-10-26 NOTE — Telephone Encounter (Signed)
CT abdomen/pelvis ordered

## 2017-10-26 NOTE — Telephone Encounter (Signed)
Ann, CT abdomen/pelvis with CM 

## 2017-10-27 NOTE — Telephone Encounter (Signed)
CT sch'd 11/09/17 at 500 (445), npo 4 hrs, pick up contrast, patient aware

## 2017-11-04 LAB — COMPLETE METABOLIC PANEL WITH GFR
AG RATIO: 1.4 (calc) (ref 1.0–2.5)
ALKALINE PHOSPHATASE (APISO): 92 U/L (ref 33–130)
ALT: 20 U/L (ref 6–29)
AST: 19 U/L (ref 10–35)
Albumin: 4.2 g/dL (ref 3.6–5.1)
BUN: 13 mg/dL (ref 7–25)
CO2: 28 mmol/L (ref 20–32)
Calcium: 9.6 mg/dL (ref 8.6–10.4)
Chloride: 105 mmol/L (ref 98–110)
Creat: 0.93 mg/dL (ref 0.50–1.05)
GFR, Est African American: 78 mL/min/{1.73_m2} (ref >=60)
GFR, Est Non African American: 67 mL/min/{1.73_m2} (ref >=60)
GLOBULIN: 3 g/dL (ref 1.9–3.7)
Glucose, Bld: 198 mg/dL — ABNORMAL HIGH (ref 65–99)
POTASSIUM: 5.2 mmol/L (ref 3.5–5.3)
SODIUM: 141 mmol/L (ref 135–146)
Total Bilirubin: 0.5 mg/dL (ref 0.2–1.2)
Total Protein: 7.2 g/dL (ref 6.1–8.1)

## 2017-11-04 LAB — HEMOGLOBIN A1C
Hgb A1c MFr Bld: 7.8 % of total Hgb — ABNORMAL HIGH (ref <5.7)
Mean Plasma Glucose: 177 (calc)
eAG (mmol/L): 9.8 (calc)

## 2017-11-09 ENCOUNTER — Ambulatory Visit (HOSPITAL_COMMUNITY): Payer: Medicare HMO

## 2017-11-10 ENCOUNTER — Ambulatory Visit: Payer: Medicare HMO | Admitting: "Endocrinology

## 2017-11-10 ENCOUNTER — Encounter: Payer: Self-pay | Admitting: Nutrition

## 2017-11-10 ENCOUNTER — Encounter: Payer: Self-pay | Admitting: "Endocrinology

## 2017-11-10 ENCOUNTER — Encounter: Payer: Medicare HMO | Attending: Family Medicine | Admitting: Nutrition

## 2017-11-10 VITALS — BP 113/78 | HR 80 | Ht 67.0 in | Wt 277.0 lb

## 2017-11-10 VITALS — Ht 68.0 in | Wt 277.0 lb

## 2017-11-10 DIAGNOSIS — E1165 Type 2 diabetes mellitus with hyperglycemia: Secondary | ICD-10-CM

## 2017-11-10 DIAGNOSIS — IMO0002 Reserved for concepts with insufficient information to code with codable children: Secondary | ICD-10-CM

## 2017-11-10 DIAGNOSIS — E118 Type 2 diabetes mellitus with unspecified complications: Secondary | ICD-10-CM

## 2017-11-10 DIAGNOSIS — E782 Mixed hyperlipidemia: Secondary | ICD-10-CM

## 2017-11-10 DIAGNOSIS — Z713 Dietary counseling and surveillance: Secondary | ICD-10-CM | POA: Insufficient documentation

## 2017-11-10 DIAGNOSIS — E669 Obesity, unspecified: Secondary | ICD-10-CM

## 2017-11-10 MED ORDER — DULAGLUTIDE 1.5 MG/0.5ML ~~LOC~~ SOAJ: 1.5000 mg | SUBCUTANEOUS | 3 refills | Status: DC

## 2017-11-10 NOTE — Progress Notes (Signed)
Endocrinology follow-up Note       11/10/2017, 10:35 AM   Subjective:    Patient ID: Tina Sampson, female    DOB: September 30, 1960.  Tina Sampson is being seen in follow-up  for management of currently uncontrolled symptomatic diabetes requested by  Deloria Lair., MD.   Past Medical History:  Diagnosis Date  . Abdominal pain 10/15/2017  . Diabetes mellitus   . Diabetes mellitus, type II ([place])   . H/O knee surgery   . Hyperlipidemia    Past Surgical History:  Procedure Laterality Date  . ANKLE SURGERY    . CARDIAC CATHETERIZATION    . catherization    . CERVICAL DISC SURGERY    . CESAREAN SECTION    . CHOLECYSTECTOMY    . KNEE SURGERY    . KNEE SURGERY    . TUBAL LIGATION     Social History   Socioeconomic History  . Marital status: Married    Spouse name: Not on file  . Number of children: Not on file  . Years of education: Not on file  . Highest education level: Not on file  Occupational History  . Not on file  Social Needs  . Financial resource strain: Not on file  . Food insecurity:    Worry: Not on file    Inability: Not on file  . Transportation needs:    Medical: Not on file    Non-medical: Not on file  Tobacco Use  . Smoking status: Never Smoker  . Smokeless tobacco: Never Used  Substance and Sexual Activity  . Alcohol use: No  . Drug use: No  . Sexual activity: Yes  Lifestyle  . Physical activity:    Days per week: Not on file    Minutes per session: Not on file  . Stress: Not on file  Relationships  . Social connections:    Talks on phone: Not on file    Gets together: Not on file    Attends religious service: Not on file    Active member of club or organization: Not on file    Attends meetings of clubs or organizations: Not on file    Relationship status: Not on file  Other Topics Concern  . Not on file  Social History Narrative  . Not on file   Outpatient  Encounter Medications as of 11/10/2017  Medication Sig  . aspirin EC 81 MG tablet Take 81 mg daily by mouth.  . B Complex Vitamins (VITAMIN B COMPLEX PO) Take daily by mouth.  . Cinnamon 500 MG capsule Take 1,000 mg by mouth daily.  . Dulaglutide (TRULICITY) 1.5 IW/9.7LG SOPN Inject 1.5 mg into the skin once a week.  . gabapentin (NEURONTIN) 300 MG capsule Take 900 mg 2 (two) times daily by mouth.  Marland Kitchen lisinopril-hydrochlorothiazide (PRINZIDE,ZESTORETIC) 10-12.5 MG tablet Take 1 tablet daily by mouth.  . metFORMIN (GLUCOPHAGE) 500 MG tablet Take 2 tablets 2 (two) times daily with a meal by mouth. 1000 mg in the a.m & 1000 mg in the p.m.   . Multiple Vitamin (MULTIVITAMIN) tablet Take 1 tablet daily by mouth.  Marland Kitchen omeprazole (PRILOSEC) 20 MG capsule  Take 20 mg daily by mouth.  . pramipexole (MIRAPEX) 0.125 MG tablet Take 0.125 mg by mouth at bedtime. For RLS  . pravastatin (PRAVACHOL) 40 MG tablet Take 40 mg by mouth daily.  . traZODone (DESYREL) 50 MG tablet Take 50 mg by mouth at bedtime.  Marland Kitchen venlafaxine (EFFEXOR) 75 MG tablet 37.5 mg 2 (two) times daily.  . [DISCONTINUED] Dulaglutide (TRULICITY) 6.28 BT/5.1VO SOPN Inject 0.75 mg into the skin once a week.  . [DISCONTINUED] pravastatin (PRAVACHOL) 40 MG tablet Take 40 mg by mouth daily.   No facility-administered encounter medications on file as of 11/10/2017.     ALLERGIES: Allergies  Allergen Reactions  . Bee Venom Shortness Of Breath, Itching and Swelling    VACCINATION STATUS: Immunization History  Administered Date(s) Administered  . Influenza Split 05/07/2013    Diabetes  She presents for her follow-up diabetic visit. She has type 2 diabetes mellitus. Onset time: She was diagnosed at approximate age of 61 years. Her disease course has been improving. There are no hypoglycemic associated symptoms. Pertinent negatives for hypoglycemia include no confusion, headaches, pallor or seizures. Associated symptoms include blurred vision and  fatigue. Pertinent negatives for diabetes include no chest pain, no polydipsia, no polyphagia and no polyuria. There are no hypoglycemic complications. Symptoms are improving. There are no diabetic complications. Risk factors for coronary artery disease include dyslipidemia, diabetes mellitus, obesity, sedentary lifestyle and post-menopausal. Current diabetic treatment includes oral agent (monotherapy). Her weight is increasing steadily. She is following a generally unhealthy diet. When asked about meal planning, she reported none. She has not had a previous visit with a dietitian. She never participates in exercise. Her overall blood glucose range is 180-200 mg/dl. An ACE inhibitor/angiotensin II receptor blocker is being taken. She does not see a podiatrist.Eye exam is current (She has not seen her ophthalmologist in 2 years, I urged to reschedule a visit.).  Hyperlipidemia  This is a chronic problem. The current episode started more than 1 year ago. The problem is controlled. Exacerbating diseases include diabetes and obesity. Pertinent negatives include no chest pain, myalgias or shortness of breath. Current antihyperlipidemic treatment includes statins. Risk factors for coronary artery disease include diabetes mellitus, dyslipidemia, obesity, a sedentary lifestyle and post-menopausal.    Review of Systems  Constitutional: Positive for fatigue. Negative for chills, fever and unexpected weight change.  HENT: Negative for trouble swallowing and voice change.   Eyes: Positive for blurred vision. Negative for visual disturbance.  Respiratory: Negative for cough, shortness of breath and wheezing.   Cardiovascular: Negative for chest pain, palpitations and leg swelling.  Gastrointestinal: Negative for diarrhea, nausea and vomiting.  Endocrine: Negative for cold intolerance, heat intolerance, polydipsia, polyphagia and polyuria.  Musculoskeletal: Negative for arthralgias and myalgias.  Skin: Negative for  color change, pallor, rash and wound.  Neurological: Negative for seizures and headaches.  Psychiatric/Behavioral: Negative for confusion and suicidal ideas.    Objective:    BP 113/78   Pulse 80   Ht 5\' 7"  (1.702 m)   Wt 277 lb (125.6 kg)   LMP 03/19/2011   BMI 43.38 kg/m   Wt Readings from Last 3 Encounters:  11/10/17 277 lb (125.6 kg)  11/10/17 277 lb (125.6 kg)  10/15/17 274 lb 9.6 oz (124.6 kg)     Physical Exam  Constitutional: She is oriented to person, place, and time. She appears well-developed.  HENT:  Head: Normocephalic and atraumatic.  Poor dental condition.  Eyes: EOM are normal.  Neck: Normal range  of motion. Neck supple. No tracheal deviation present. No thyromegaly present.  Cardiovascular: Normal rate.  Pulmonary/Chest: Effort normal.  Abdominal: Soft. There is no tenderness. There is no guarding.  Musculoskeletal: Normal range of motion. She exhibits no edema.  Neurological: She is alert and oriented to person, place, and time. She has normal reflexes. No cranial nerve deficit. Coordination normal.  Skin: Skin is warm and dry. No rash noted. No erythema. No pallor.  Psychiatric: She has a normal mood and affect. Judgment normal.   Recent Results (from the past 2160 hour(s))  Hepatic function panel     Status: None   Collection Time: 10/15/17 10:48 AM  Result Value Ref Range   Total Protein 7.0 6.1 - 8.1 g/dL   Albumin 4.1 3.6 - 5.1 g/dL   Globulin 2.9 1.9 - 3.7 g/dL (calc)   AG Ratio 1.4 1.0 - 2.5 (calc)   Total Bilirubin 0.4 0.2 - 1.2 mg/dL   Bilirubin, Direct 0.1 0.0 - 0.2 mg/dL   Indirect Bilirubin 0.3 0.2 - 1.2 mg/dL (calc)   Alkaline phosphatase (APISO) 98 33 - 130 U/L   AST 19 10 - 35 U/L   ALT 18 6 - 29 U/L  COMPLETE METABOLIC PANEL WITH GFR     Status: Abnormal   Collection Time: 11/03/17  9:58 AM  Result Value Ref Range   Glucose, Bld 198 (H) 65 - 99 mg/dL    Comment: .            Fasting reference interval . For someone without  known diabetes, a glucose value >125 mg/dL indicates that they may have diabetes and this should be confirmed with a follow-up test. .    BUN 13 7 - 25 mg/dL   Creat 0.93 0.50 - 1.05 mg/dL    Comment: For patients >62 years of age, the reference limit for Creatinine is approximately 13% higher for people identified as African-American. .    GFR, Est Non African American 67 > OR = 60 mL/min/1.68m2   GFR, Est African American 78 > OR = 60 mL/min/1.74m2   BUN/Creatinine Ratio NOT APPLICABLE 6 - 22 (calc)   Sodium 141 135 - 146 mmol/L   Potassium 5.2 3.5 - 5.3 mmol/L   Chloride 105 98 - 110 mmol/L   CO2 28 20 - 32 mmol/L   Calcium 9.6 8.6 - 10.4 mg/dL   Total Protein 7.2 6.1 - 8.1 g/dL   Albumin 4.2 3.6 - 5.1 g/dL   Globulin 3.0 1.9 - 3.7 g/dL (calc)   AG Ratio 1.4 1.0 - 2.5 (calc)   Total Bilirubin 0.5 0.2 - 1.2 mg/dL   Alkaline phosphatase (APISO) 92 33 - 130 U/L   AST 19 10 - 35 U/L   ALT 20 6 - 29 U/L  Hemoglobin A1c     Status: Abnormal   Collection Time: 11/03/17  9:58 AM  Result Value Ref Range   Hgb A1c MFr Bld 7.8 (H) <5.7 % of total Hgb    Comment: For someone without known diabetes, a hemoglobin A1c value of 6.5% or greater indicates that they may have  diabetes and this should be confirmed with a follow-up  test. . For someone with known diabetes, a value <7% indicates  that their diabetes is well controlled and a value  greater than or equal to 7% indicates suboptimal  control. A1c targets should be individualized based on  duration of diabetes, age, comorbid conditions, and  other considerations. . Currently, no consensus exists  regarding use of hemoglobin A1c for diagnosis of diabetes for children. .    Mean Plasma Glucose 177 (calc)   eAG (mmol/L) 9.8 (calc)     Assessment & Plan:   1. Uncontrolled type 2 diabetes mellitus with hyperglycemia ([place])  - Tina Sampson has currently uncontrolled symptomatic type 2 DM since  57 years of age. - She  came with overall improvement in her diabetes, with A1c of 7.8%, slowly improving from 8.8%.     Recent labs reviewed.  -her diabetes is complicated by obesity/sedentary life and Tina Sampson remains at a high risk for more acute and chronic complications which include CAD, CVA, CKD, retinopathy, and neuropathy. These are all discussed in detail with the patient.  - I have counseled her on diet management and weight loss, by adopting a carbohydrate restricted/protein rich diet.  -  Suggestion is made for her to avoid simple carbohydrates  from her diet including Cakes, Sweet Desserts / Pastries, Ice Cream, Soda (diet and regular), Sweet Tea, Candies, Chips, Cookies, Store Bought Juices, Alcohol in Excess of  1-2 drinks a day, Artificial Sweeteners, and "Sugar-free" Products. This will help patient to have stable blood glucose profile and potentially avoid unintended weight gain.   - I encouraged her to switch to  unprocessed or minimally processed complex starch and increased protein intake (animal or plant source), fruits, and vegetables.  - she is advised to stick to a routine mealtimes to eat 3 meals  a day and avoid unnecessary snacks ( to snack only to correct hypoglycemia).   - I have approached her with the following individualized plan to manage diabetes and patient agrees:  -I advised her to continue metformin 1000 mg by mouth twice a day, therapeutically suitable for patient . -  I discussed and increased weekly Trulicity 1.5 mg subcutaneously.  - Patient specific target  A1c;  LDL, HDL, Triglycerides, and  Waist Circumference were discussed in detail.  2) BP/HTN: Her blood pressure is controlled to target.   I   advised her to continue lisinopril 10 mg/HCTZ 12.5 mg by mouth daily.  3) Lipids/HPL:   Her fasting lipid panel shows LDL at 66.Patient is advised to continue statins. 4)  Weight/Diet: CDE Consult has been initiated- consult pending , exercise, and detailed  carbohydrates information provided.  5) Chronic Care/Health Maintenance:  -she  is on  Statin medications and   lisinopril is encouraged to continue to follow up with Ophthalmology, Dentist,  Podiatrist at least yearly or according to recommendations, and advised to  stay away from smoking. I have recommended yearly flu vaccine and pneumonia vaccination at least every 5 years; moderate intensity exercise for up to 150 minutes weekly; and  sleep for at least 7 hours a day. - I advised her to seek referral to a dentist.   - I advised patient to maintain close follow up with Deloria Lair., MD for primary care needs. - Time spent with the patient: 25 min, of which >50% was spent in reviewing her blood glucose logs , discussing her hypo- and hyper-glycemic episodes, reviewing her current and  previous labs and insulin doses and developing a plan to avoid hypo- and hyper-glycemia. Please refer to Patient Instructions for Blood Glucose Monitoring and Insulin/Medications Dosing Guide"  in media tab for additional information. Tina Sampson participated in the discussions, expressed understanding, and voiced agreement with the above plans.  All questions were answered to her satisfaction. she is encouraged to contact clinic  should she have any questions or concerns prior to her return visit.  Follow up plan: - Return in about 4 months (around 03/12/2018) for follow up with pre-visit labs.  Glade Lloyd, MD [facility] [address] [place], [place] [address] Phone: [phone]  Fax: [phone]    11/10/2017, 10:35 AM  This note was partially dictated with voice recognition software. Similar sounding words can be transcribed inadequately or may not  be corrected upon review.

## 2017-11-10 NOTE — Patient Instructions (Addendum)
Goals 1. Cut out diet soda 2. Start walking 15 minutes a day daily. 3. Keep drinking water 4. Cut out processed meats and high fat foods. 5. Increase more vegetables at lunch and dinner. Lose 5 lbs in 2-3 months.

## 2017-11-10 NOTE — Progress Notes (Signed)
  Medical Nutrition Therapy:  F/u  Appt start time: 1000 end time: 1030Assessment:  Primary concerns today: Diabetes. Type 2.    .A1C 7.8%, down from 8.8%. She has cut out out sodas but drinking diet sodas. Trying to drink some water and cutting out snacks and fast foods.  Goes to a lot of car shows and eats out often. Trying to fix food to take with her now. Currently taking Metformin 1000 mg BID. And Trulicity daily. Gained 3 lbs but ate a lot of salty processed foods this past weekend. She is motivated and engaged to continue to make better food choices. Diet is still excessive in CHO, Fat, sodium and calories. BS in am 150-190's.     CMP Latest Ref Rng & Units 11/03/2017 10/15/2017 08/05/2017  Glucose 65 - 99 mg/dL 198(H) - 200(H)  BUN 7 - 25 mg/dL 13 - 11  Creatinine 0.50 - 1.05 mg/dL 0.93 - 0.81  Sodium 135 - 146 mmol/L 141 - 140  Potassium 3.5 - 5.3 mmol/L 5.2 - 4.2  Chloride 98 - 110 mmol/L 105 - 105  CO2 20 - 32 mmol/L 28 - 25  Calcium 8.6 - 10.4 mg/dL 9.6 - 9.1  Total Protein 6.1 - 8.1 g/dL 7.2 7.0 6.7  Total Bilirubin 0.2 - 1.2 mg/dL 0.5 0.4 0.5  AST 10 - 35 U/L 19 19 19   ALT 6 - 29 U/L 20 18 17    Lab Results  Component Value Date   HGBA1C 7.8 (H) 11/03/2017      Preferred Learning Style:   No preference indicated   Learning Readiness:   Ready  Change in progress   MEDICATIONS: See list  DIETARY INTAKE:   24-hr recall:  B ( AM):Skipped:  Snk ( AM):  L ( PM):Baked ham, mashed potatoes, baked apples, Diet Pepsi Snk ( PM):  D ( PM): Meat and veggies, water Snk ( PM):  Beverages: water Usual physical activity:  ADL  Estimated energy needs: 1500  calories 170 g carbohydrates 112 g protein 42 g fat  Progress Towards Goal(s):  In progress.   Nutritional Diagnosis:  NB-1.1 Food and nutrition-related knowledge deficit As related to Diabetes, Obesity.  As evidenced by A1C 8.8 and BMI> 40.    Intervention: Nutrition and Diabetes education provided on My  Plate, CHO counting, meal planning, portion sizes, timing of meals, avoiding snacks between meals unless having a low blood sugar, target ranges for A1C and blood sugars, signs/symptoms and treatment of hyper/hypoglycemia, monitoring blood sugars, taking medications as prescribed, benefits of exercising 30 minutes per day and prevention of complications of DM.  Goals 1. Cut out diet soda 2. Start walking 15 minutes a day daily. 3. Keep drinking water 4. Cut out processed meats and high fat foods. 5. Increase more vegetables at lunch and dinner.   Teaching Method Utilized:  Visual Auditory Hands on  Handouts given during visit include:  The Plate Method   Meal Plan Card  Diabetes Instrucitons.   Barriers to learning/adherence to lifestyle change: none  Demonstrated degree of understanding via:  Teach Back   Monitoring/Evaluation:  Dietary intake, exercise, meal planning, sbg, and body weight in 3 month(s).

## 2017-11-10 NOTE — Patient Instructions (Signed)

## 2017-11-30 ENCOUNTER — Telehealth (INDEPENDENT_AMBULATORY_CARE_PROVIDER_SITE_OTHER): Payer: Self-pay | Admitting: Internal Medicine

## 2017-11-30 NOTE — Telephone Encounter (Signed)
CT sch'd 12/17/17 at 900 am (845 am), npo 4 hrs, don't take metformin morning of CT, patient aware

## 2017-11-30 NOTE — Telephone Encounter (Signed)
Patient called stated she missed her CT appointment and wants to reschedule.  Ph# [phone]

## 2017-12-08 ENCOUNTER — Other Ambulatory Visit: Payer: Self-pay

## 2017-12-08 MED ORDER — LISINOPRIL-HYDROCHLOROTHIAZIDE 10-12.5 MG PO TABS: 1.0000 | ORAL_TABLET | Freq: Every day | ORAL | 0 refills | Status: DC

## 2017-12-10 ENCOUNTER — Other Ambulatory Visit: Payer: Self-pay | Admitting: "Endocrinology

## 2017-12-11 ENCOUNTER — Other Ambulatory Visit: Payer: Self-pay

## 2017-12-11 MED ORDER — LISINOPRIL-HYDROCHLOROTHIAZIDE 10-12.5 MG PO TABS: 1.0000 | ORAL_TABLET | Freq: Every day | ORAL | 0 refills | Status: DC

## 2017-12-16 ENCOUNTER — Telehealth: Payer: Self-pay | Admitting: "Endocrinology

## 2017-12-17 ENCOUNTER — Ambulatory Visit (HOSPITAL_COMMUNITY)
Admission: RE | Admit: 2017-12-17 | Discharge: 2017-12-17 | Disposition: A | Discharge status: Home / Self Care | Payer: Medicare HMO | Source: Ambulatory Visit | Attending: Internal Medicine | Admitting: Internal Medicine

## 2017-12-17 DIAGNOSIS — I700 Atherosclerosis of aorta: Secondary | ICD-10-CM | POA: Insufficient documentation

## 2017-12-17 DIAGNOSIS — R1011 Right upper quadrant pain: Secondary | ICD-10-CM | POA: Insufficient documentation

## 2017-12-17 DIAGNOSIS — R1031 Right lower quadrant pain: Secondary | ICD-10-CM | POA: Diagnosis not present

## 2017-12-17 LAB — POCT I-STAT CREATININE: Creatinine, Ser: 0.7 mg/dL (ref 0.44–1.00)

## 2017-12-17 MED ORDER — IOPAMIDOL (ISOVUE-300) INJECTION 61%
100.0000 mL | Freq: Once | INTRAVENOUS | Status: AC | PRN
  Administered 2017-12-17: 100 mL via INTRAVENOUS

## 2017-12-18 NOTE — Telephone Encounter (Signed)
Opened in Error.

## 2018-01-21 ENCOUNTER — Telehealth: Payer: Self-pay | Admitting: "Endocrinology

## 2018-01-21 ENCOUNTER — Other Ambulatory Visit: Payer: Self-pay | Admitting: "Endocrinology

## 2018-01-21 MED ORDER — EXENATIDE ER 2 MG/0.85ML ~~LOC~~ AUIJ: 2.0000 mg | AUTO-INJECTOR | SUBCUTANEOUS | 2 refills | Status: DC

## 2018-01-21 NOTE — Telephone Encounter (Signed)
Let us switch to Bydureon 2 mg weekly. I will send in an rx.

## 2018-01-21 NOTE — Telephone Encounter (Signed)
Tina Sampson is calling stating that she is in the donut hole with her insurance and its not paying right now for her Dulaglutide (TRULICITY) 1.5 WI/2.0BT SOPN she cant afford the $300+ a month out of pocket, please advise?

## 2018-03-15 ENCOUNTER — Ambulatory Visit: Payer: Medicare HMO | Admitting: Nutrition

## 2018-03-15 ENCOUNTER — Ambulatory Visit: Payer: Medicare HMO | Admitting: "Endocrinology

## 2018-04-03 LAB — COMPLETE METABOLIC PANEL WITH GFR
AG Ratio: 1.4 (calc) (ref 1.0–2.5)
ALBUMIN MSPROF: 4 g/dL (ref 3.6–5.1)
ALKALINE PHOSPHATASE (APISO): 85 U/L (ref 33–130)
ALT: 21 U/L (ref 6–29)
AST: 24 U/L (ref 10–35)
BILIRUBIN TOTAL: 0.4 mg/dL (ref 0.2–1.2)
BUN: 17 mg/dL (ref 7–25)
CHLORIDE: 106 mmol/L (ref 98–110)
CO2: 27 mmol/L (ref 20–32)
Calcium: 10.2 mg/dL (ref 8.6–10.4)
Creat: 0.93 mg/dL (ref 0.50–1.05)
GFR, EST AFRICAN AMERICAN: 78 mL/min/{1.73_m2} (ref >=60)
GFR, Est Non African American: 67 mL/min/{1.73_m2} (ref >=60)
GLUCOSE: 227 mg/dL — AB (ref 65–99)
Globulin: 2.9 g/dL (calc) (ref 1.9–3.7)
POTASSIUM: 4.7 mmol/L (ref 3.5–5.3)
Sodium: 141 mmol/L (ref 135–146)
Total Protein: 6.9 g/dL (ref 6.1–8.1)

## 2018-04-03 LAB — HEMOGLOBIN A1C
Hgb A1c MFr Bld: 9.3 % of total Hgb — ABNORMAL HIGH (ref <5.7)
Mean Plasma Glucose: 220 (calc)
eAG (mmol/L): 12.2 (calc)

## 2018-04-08 ENCOUNTER — Encounter: Payer: Self-pay | Admitting: Nutrition

## 2018-04-08 ENCOUNTER — Encounter: Payer: Self-pay | Admitting: "Endocrinology

## 2018-04-08 ENCOUNTER — Encounter: Payer: Medicare HMO | Attending: Family Medicine | Admitting: Nutrition

## 2018-04-08 ENCOUNTER — Ambulatory Visit (INDEPENDENT_AMBULATORY_CARE_PROVIDER_SITE_OTHER): Payer: Medicare HMO | Admitting: "Endocrinology

## 2018-04-08 VITALS — Ht 67.0 in | Wt 273.0 lb

## 2018-04-08 VITALS — BP 101/68 | HR 95 | Ht 67.0 in | Wt 273.2 lb

## 2018-04-08 DIAGNOSIS — IMO0002 Reserved for concepts with insufficient information to code with codable children: Secondary | ICD-10-CM

## 2018-04-08 DIAGNOSIS — Z713 Dietary counseling and surveillance: Secondary | ICD-10-CM | POA: Diagnosis not present

## 2018-04-08 DIAGNOSIS — E669 Obesity, unspecified: Secondary | ICD-10-CM

## 2018-04-08 DIAGNOSIS — E782 Mixed hyperlipidemia: Secondary | ICD-10-CM | POA: Diagnosis not present

## 2018-04-08 DIAGNOSIS — E1165 Type 2 diabetes mellitus with hyperglycemia: Secondary | ICD-10-CM | POA: Diagnosis present

## 2018-04-08 DIAGNOSIS — E118 Type 2 diabetes mellitus with unspecified complications: Secondary | ICD-10-CM

## 2018-04-08 NOTE — Patient Instructions (Signed)

## 2018-04-08 NOTE — Progress Notes (Signed)
  Medical Nutrition Therapy:  F/u  Appt start time:1000 end time: 1030Assessment:  Primary concerns today: Diabetes. Type 2. Lost 4 lbs from higher blood sugars. A1C up to 9.3%. From 7,8%. Still skipping breakfast. Saw Dr. Dorris Fetch today. To test bloods sugars 4 times per day x 10 days.  Has been mowving and weed eating. Has cut back on diet soda and drinking more water.  Trulicity and 2633 mg BID of Metformin. Her diet cotinues to be high in processed, high fat, high salt and sugary foods. Still drinking too many diet sodas and not eating much fruits or vegetables. Doesn't like much vegetables. Willing to work harder on preparing meals and not eating out fast food.   CMP Latest Ref Rng & Units 04/02/2018 12/17/2017 11/03/2017  Glucose 65 - 99 mg/dL 227(H) - 198(H)  BUN 7 - 25 mg/dL 17 - 13  Creatinine 0.50 - 1.05 mg/dL 0.93 0.70 0.93  Sodium 135 - 146 mmol/L 141 - 141  Potassium 3.5 - 5.3 mmol/L 4.7 - 5.2  Chloride 98 - 110 mmol/L 106 - 105  CO2 20 - 32 mmol/L 27 - 28  Calcium 8.6 - 10.4 mg/dL 10.2 - 9.6  Total Protein 6.1 - 8.1 g/dL 6.9 - 7.2  Total Bilirubin 0.2 - 1.2 mg/dL 0.4 - 0.5  AST 10 - 35 U/L 24 - 19  ALT 6 - 29 U/L 21 - 20   Lab Results  Component Value Date   HGBA1C 9.3 (H) 04/02/2018    Preferred Learning Style:   No preference indicated   Learning Readiness:   Ready  Change in progress   MEDICATIONS: See list  DIETARY INTAKE:   24-hr recall:  B ( AM): skipped Snk ( AM):  L ( PM):BBQ plate chopped , coleslaw and Diet sodas Snk ( PM):  D ( PM): Pizza supreme 2 slices, Diet sodas Snk ( PM):  Beverages: water Usual physical activity:  Mows yards and weedeats.   Estimated energy needs: 1500  calories 170 g carbohydrates 112 g protein 42 g fat  Progress Towards Goal(s):  In progress.   Nutritional Diagnosis:  NB-1.1 Food and nutrition-related knowledge deficit As related to Diabetes, Obesity.  As evidenced by A1C 8.8 and BMI> 40.    Intervention:  Nutrition and Diabetes education provided on My Plate, CHO counting, meal planning, portion sizes, timing of meals, avoiding snacks between meals unless having a low blood sugar, target ranges for A1C and blood sugars, signs/symptoms and treatment of hyper/hypoglycemia, monitoring blood sugars, taking medications as prescribed, benefits of exercising 30 minutes per day and prevention of complications of DM.   Goals Cut out eating fast food  Drink more water Keep food journal  Eat meals on time  Cut out snacks and diet sodas.  Teaching Method Utilized:  Visual Auditory Hands on  Handouts given during visit include:  The Plate Method   Meal Plan Card  Diabetes Instrucitons.   Barriers to learning/adherence to lifestyle change: none  Demonstrated degree of understanding via:  Teach Back   Monitoring/Evaluation:  Dietary intake, exercise, meal planning, sbg, and body weight in 3 weeks.

## 2018-04-08 NOTE — Patient Instructions (Addendum)
Goals Cut out eating fast food  Drink more water Keep food journal  Eat meals on time  Cut out snacks and diet sodas.

## 2018-04-08 NOTE — Progress Notes (Signed)
Endocrinology follow-up Note       04/08/2018, 12:26 PM   Subjective:    Patient ID: Tina Sampson, female    DOB: [birth year].  Tina Sampson is being seen in follow-up  for management of currently uncontrolled symptomatic type 2 diabetes, hyperlipidemia, hypertension. PMD:  Deloria Lair., MD.   Past Medical History:  Diagnosis Date  . Abdominal pain 10/15/2017  . Diabetes mellitus   . Diabetes mellitus, type II ([place])   . H/O knee surgery   . Hyperlipidemia    Past Surgical History:  Procedure Laterality Date  . ANKLE SURGERY    . CARDIAC CATHETERIZATION    . catherization    . CERVICAL DISC SURGERY    . CESAREAN SECTION    . CHOLECYSTECTOMY    . KNEE SURGERY    . KNEE SURGERY    . TUBAL LIGATION     Social History   Socioeconomic History  . Marital status: Married    Spouse name: Not on file  . Number of children: Not on file  . Years of education: Not on file  . Highest education level: Not on file  Occupational History  . Not on file  Social Needs  . Financial resource strain: Not on file  . Food insecurity:    Worry: Not on file    Inability: Not on file  . Transportation needs:    Medical: Not on file    Non-medical: Not on file  Tobacco Use  . Smoking status: Never Smoker  . Smokeless tobacco: Never Used  Substance and Sexual Activity  . Alcohol use: No  . Drug use: No  . Sexual activity: Yes  Lifestyle  . Physical activity:    Days per week: Not on file    Minutes per session: Not on file  . Stress: Not on file  Relationships  . Social connections:    Talks on phone: Not on file    Gets together: Not on file    Attends religious service: Not on file    Active member of club or organization: Not on file    Attends meetings of clubs or organizations: Not on file    Relationship status: Not on file  Other Topics Concern  . Not on file  Social History Narrative   . Not on file   Outpatient Encounter Medications as of 04/08/2018  Medication Sig  . Dulaglutide 0.75 MG/0.5ML SOPN Inject 0.75 mg into the skin once a week.  Marland Kitchen aspirin EC 81 MG tablet Take 81 mg daily by mouth.  . B Complex Vitamins (VITAMIN B COMPLEX PO) Take daily by mouth.  . Cinnamon 500 MG capsule Take 1,000 mg by mouth daily.  Marland Kitchen gabapentin (NEURONTIN) 300 MG capsule Take 900 mg 2 (two) times daily by mouth.  Marland Kitchen lisinopril-hydrochlorothiazide (PRINZIDE,ZESTORETIC) 10-12.5 MG tablet Take 1 tablet by mouth daily.  . metFORMIN (GLUCOPHAGE) 500 MG tablet Take 2 tablets 2 (two) times daily with a meal by mouth. 1000 mg in the a.m & 1000 mg in the p.m.   . Multiple Vitamin (MULTIVITAMIN) tablet Take 1 tablet daily by mouth.  Marland Kitchen omeprazole (PRILOSEC) 20  MG capsule Take 20 mg daily by mouth.  . pramipexole (MIRAPEX) 0.125 MG tablet Take 0.125 mg by mouth at bedtime. For RLS  . pravastatin (PRAVACHOL) 40 MG tablet Take 40 mg by mouth daily.  . traZODone (DESYREL) 50 MG tablet Take 50 mg by mouth at bedtime.  Marland Kitchen venlafaxine (EFFEXOR) 75 MG tablet 37.5 mg 2 (two) times daily.  . [DISCONTINUED] Dulaglutide (TRULICITY) 1.5 HK/7.4QV SOPN Inject 1.5 mg into the skin once a week.  . [DISCONTINUED] Exenatide ER (BYDUREON BCISE) 2 MG/0.85ML AUIJ Inject 2 mg into the skin once a week.   No facility-administered encounter medications on file as of 04/08/2018.     ALLERGIES: Allergies  Allergen Reactions  . Bee Venom Shortness Of Breath, Itching and Swelling    VACCINATION STATUS: Immunization History  Administered Date(s) Administered  . Influenza Split 05/07/2013    Diabetes  She presents for her follow-up diabetic visit. She has type 2 diabetes mellitus. Onset time: She was diagnosed at approximate age of 39 years. Her disease course has been worsening. There are no hypoglycemic associated symptoms. Pertinent negatives for hypoglycemia include no confusion, headaches, pallor or seizures.  Associated symptoms include blurred vision, fatigue, polydipsia and polyuria. Pertinent negatives for diabetes include no chest pain and no polyphagia. There are no hypoglycemic complications. Symptoms are worsening. There are no diabetic complications. Risk factors for coronary artery disease include dyslipidemia, diabetes mellitus, obesity, sedentary lifestyle and post-menopausal. Current diabetic treatment includes oral agent (monotherapy) (She could not afford Trulicity nor Bydureon.). Her weight is fluctuating minimally. She is following a generally unhealthy diet. When asked about meal planning, she reported none. She has not had a previous visit with a dietitian. She never participates in exercise. Her overall blood glucose range is >200 mg/dl. (Returns with higher A1c of 9.3%, increasing from 7.8%.) An ACE inhibitor/angiotensin II receptor blocker is being taken. She does not see a podiatrist.Eye exam is current (She has not seen her ophthalmologist in 2 years, I urged to reschedule a visit.).  Hyperlipidemia  This is a chronic problem. The current episode started more than 1 year ago. The problem is controlled. Exacerbating diseases include diabetes and obesity. Pertinent negatives include no chest pain, myalgias or shortness of breath. Current antihyperlipidemic treatment includes statins. Risk factors for coronary artery disease include diabetes mellitus, dyslipidemia, obesity, a sedentary lifestyle and post-menopausal.    Review of Systems  Constitutional: Positive for fatigue. Negative for chills, fever and unexpected weight change.  HENT: Negative for trouble swallowing and voice change.   Eyes: Positive for blurred vision. Negative for visual disturbance.  Respiratory: Negative for cough, shortness of breath and wheezing.   Cardiovascular: Negative for chest pain, palpitations and leg swelling.  Gastrointestinal: Negative for diarrhea, nausea and vomiting.  Endocrine: Positive for  polydipsia and polyuria. Negative for cold intolerance, heat intolerance and polyphagia.  Musculoskeletal: Negative for arthralgias and myalgias.  Skin: Negative for color change, pallor, rash and wound.  Neurological: Negative for seizures and headaches.  Psychiatric/Behavioral: Negative for confusion and suicidal ideas.    Objective:    BP 101/68   Pulse 95   Ht 5\' 7"  (1.702 m)   Wt 273 lb 3.2 oz (123.9 kg)   LMP 03/19/2011   SpO2 93%   BMI 42.79 kg/m   Wt Readings from Last 3 Encounters:  04/08/18 273 lb 3.2 oz (123.9 kg)  04/08/18 273 lb (123.8 kg)  11/10/17 277 lb (125.6 kg)     Physical Exam  Constitutional: She  is oriented to person, place, and time. She appears well-developed.  HENT:  Head: Normocephalic and atraumatic.  Poor dental condition.  Eyes: EOM are normal.  Neck: Normal range of motion. Neck supple. No tracheal deviation present. No thyromegaly present.  Cardiovascular: Normal rate.  Pulmonary/Chest: Effort normal.  Abdominal: Bowel sounds are normal. There is no tenderness. There is no guarding.  Musculoskeletal: Normal range of motion. She exhibits no edema.  Neurological: She is alert and oriented to person, place, and time. No cranial nerve deficit. Coordination normal.  Skin: Skin is warm and dry. No rash noted. No erythema. No pallor.  Psychiatric: She has a normal mood and affect. Judgment normal.   Recent Results (from the past 2160 hour(s))  COMPLETE METABOLIC PANEL WITH GFR     Status: Abnormal   Collection Time: 04/02/18 11:18 AM  Result Value Ref Range   Glucose, Bld 227 (H) 65 - 99 mg/dL    Comment: .            Fasting reference interval . For someone without known diabetes, a glucose value >125 mg/dL indicates that they may have diabetes and this should be confirmed with a follow-up test. .    BUN 17 7 - 25 mg/dL   Creat 0.93 0.50 - 1.05 mg/dL    Comment: For patients >30 years of age, the reference limit for Creatinine is  approximately 13% higher for people identified as African-American. .    GFR, Est Non African American 67 > OR = 60 mL/min/1.48m2   GFR, Est African American 78 > OR = 60 mL/min/1.40m2   BUN/Creatinine Ratio NOT APPLICABLE 6 - 22 (calc)   Sodium 141 135 - 146 mmol/L   Potassium 4.7 3.5 - 5.3 mmol/L   Chloride 106 98 - 110 mmol/L   CO2 27 20 - 32 mmol/L   Calcium 10.2 8.6 - 10.4 mg/dL   Total Protein 6.9 6.1 - 8.1 g/dL   Albumin 4.0 3.6 - 5.1 g/dL   Globulin 2.9 1.9 - 3.7 g/dL (calc)   AG Ratio 1.4 1.0 - 2.5 (calc)   Total Bilirubin 0.4 0.2 - 1.2 mg/dL   Alkaline phosphatase (APISO) 85 33 - 130 U/L   AST 24 10 - 35 U/L   ALT 21 6 - 29 U/L  Hemoglobin A1c     Status: Abnormal   Collection Time: 04/02/18 11:18 AM  Result Value Ref Range   Hgb A1c MFr Bld 9.3 (H) <5.7 % of total Hgb    Comment: For someone without known diabetes, a hemoglobin A1c value of 6.5% or greater indicates that they may have  diabetes and this should be confirmed with a follow-up  test. . For someone with known diabetes, a value <7% indicates  that their diabetes is well controlled and a value  greater than or equal to 7% indicates suboptimal  control. A1c targets should be individualized based on  duration of diabetes, age, comorbid conditions, and  other considerations. . Currently, no consensus exists regarding use of hemoglobin A1c for diagnosis of diabetes for children. .    Mean Plasma Glucose 220 (calc)   eAG (mmol/L) 12.2 (calc)    Lipid Panel     Component Value Date/Time   CHOL 130 08/05/2017 1133   TRIG 177 (H) 08/05/2017 1133   HDL 39 (L) 08/05/2017 1133   CHOLHDL 3.3 08/05/2017 1133   LDLCALC 66 08/05/2017 1133    Assessment & Plan:   1. Uncontrolled type 2 diabetes mellitus  with hyperglycemia ([place])  - Tina Sampson has currently uncontrolled symptomatic type 2 DM since  57 years of age. -Unfortunately she could not afford Trulicity nor Bydureon.  She has had only  metformin for the last 74-month.  Her A1c is higher at 9.3%, increasing from 7.8%.     Recent labs reviewed.  -her diabetes is complicated by obesity/sedentary life and Tina Sampson remains at a high risk for more acute and chronic complications which include CAD, CVA, CKD, retinopathy, and neuropathy. These are all discussed in detail with the patient.  - I have counseled her on diet management and weight loss, by adopting a carbohydrate restricted/protein rich diet.  -  Suggestion is made for her to avoid simple carbohydrates  from her diet including Cakes, Sweet Desserts / Pastries, Ice Cream, Soda (diet and regular), Sweet Tea, Candies, Chips, Cookies, Store Bought Juices, Alcohol in Excess of  1-2 drinks a day, Artificial Sweeteners, and "Sugar-free" Products. This will help patient to have stable blood glucose profile and potentially avoid unintended weight gain.  - I encouraged her to switch to  unprocessed or minimally processed complex starch and increased protein intake (animal or plant source), fruits, and vegetables.  - she is advised to stick to a routine mealtimes to eat 3 meals  a day and avoid unnecessary snacks ( to snack only to correct hypoglycemia).   - I have approached her with the following individualized plan to manage diabetes and patient agrees:  -Based on her prevailing glycemic burden, she will need insulin treatment in order for her to achieve and maintain control of diabetes to target. -However, patient is hesitant to start insulin treatment today.    -I approached her to start monitoring blood glucose 4 times a day-daily before breakfast, lunch, supper, and at bedtime and return in 10 days with her meter and logs for reevaluation. -In the meantime, I advised her to continue metformin 1000 mg p.o. twice daily after breakfast and supper. -I gave her sample of Trulicity 7.35 mg of cutaneously weekly.    - Patient specific target  A1c;  LDL, HDL, Triglycerides, and   Waist Circumference were discussed in detail.  2) BP/HTN: Her blood pressure is controlled to target.   I   advised her to continue lisinopril 10 mg/HCTZ 12.5 mg by mouth daily.  3) Lipids/HPL:   Her fasting lipid panel shows LDL at 66. She is advised to continue pravastatin 40 mg p.o. nightly.    4)  Weight/Diet: CDE Consult has been initiated- consult pending , exercise, and detailed carbohydrates information provided.  5) Chronic Care/Health Maintenance:  -she  is on  Statin medications and   lisinopril is encouraged to continue to follow up with Ophthalmology, Dentist,  Podiatrist at least yearly or according to recommendations, and advised to  stay away from smoking. I have recommended yearly flu vaccine and pneumonia vaccination at least every 5 years; moderate intensity exercise for up to 150 minutes weekly; and  sleep for at least 7 hours a day. - I advised her to seek referral to a dentist.   - I advised patient to maintain close follow up with Deloria Lair., MD for primary care needs.  - Time spent with the patient: 25 min, of which >50% was spent in reviewing her blood glucose logs , discussing her hypo- and hyper-glycemic episodes, reviewing her current and  previous labs and insulin doses and developing a plan to avoid hypo- and hyper-glycemia. Please refer to  Patient Instructions for Blood Glucose Monitoring and Insulin/Medications Dosing Guide"  in media tab for additional information. [name] participated in the discussions, expressed understanding, and voiced agreement with the above plans.  All questions were answered to her satisfaction. she is encouraged to contact clinic should she have any questions or concerns prior to her return visit.   Follow up plan: - Return in about 10 days (around 04/18/2018) for Follow up with Meter and Logs Only - no Labs.  Glade Lloyd, MD [facility] [address] [place], [place] [address] Phone: [phone]  Fax: [phone]    04/08/2018, 12:26 PM  This note was partially dictated with voice recognition software. Similar sounding words can be transcribed inadequately or may not  be corrected upon review.

## 2018-04-19 ENCOUNTER — Ambulatory Visit (INDEPENDENT_AMBULATORY_CARE_PROVIDER_SITE_OTHER): Payer: Medicare HMO | Admitting: "Endocrinology

## 2018-04-19 ENCOUNTER — Encounter: Payer: Self-pay | Admitting: "Endocrinology

## 2018-04-19 ENCOUNTER — Encounter: Payer: Medicare HMO | Attending: Family Medicine | Admitting: Nutrition

## 2018-04-19 VITALS — BP 129/83 | HR 80 | Ht 67.0 in | Wt 273.0 lb

## 2018-04-19 VITALS — Ht 67.0 in | Wt 273.0 lb

## 2018-04-19 DIAGNOSIS — E1165 Type 2 diabetes mellitus with hyperglycemia: Secondary | ICD-10-CM | POA: Diagnosis present

## 2018-04-19 DIAGNOSIS — IMO0002 Reserved for concepts with insufficient information to code with codable children: Secondary | ICD-10-CM

## 2018-04-19 DIAGNOSIS — E782 Mixed hyperlipidemia: Secondary | ICD-10-CM

## 2018-04-19 DIAGNOSIS — Z713 Dietary counseling and surveillance: Secondary | ICD-10-CM | POA: Insufficient documentation

## 2018-04-19 DIAGNOSIS — E118 Type 2 diabetes mellitus with unspecified complications: Secondary | ICD-10-CM

## 2018-04-19 MED ORDER — DULAGLUTIDE 1.5 MG/0.5ML ~~LOC~~ SOAJ: 1.5000 mg | SUBCUTANEOUS | 2 refills | Status: DC

## 2018-04-19 NOTE — Patient Instructions (Addendum)
Goals  Keep food journal daily  MYfitnessPal Keep eating more vegetables Increase exercise Get BS below 130 in am and 150 mg/dl before bed.

## 2018-04-19 NOTE — Patient Instructions (Signed)

## 2018-04-19 NOTE — Progress Notes (Signed)
Endocrinology follow-up Note       04/19/2018, 1:11 PM   Subjective:    Patient ID: Tina Sampson, female    DOB: 1961/02/03.  Tina Sampson is being seen in follow-up  for management of currently uncontrolled symptomatic type 2 diabetes, hyperlipidemia, hypertension. PMD:  Deloria Lair., MD.   Past Medical History:  Diagnosis Date  . Abdominal pain 10/15/2017  . Diabetes mellitus   . Diabetes mellitus, type II ([place])   . H/O knee surgery   . Hyperlipidemia    Past Surgical History:  Procedure Laterality Date  . ANKLE SURGERY    . CARDIAC CATHETERIZATION    . catherization    . CERVICAL DISC SURGERY    . CESAREAN SECTION    . CHOLECYSTECTOMY    . KNEE SURGERY    . KNEE SURGERY    . TUBAL LIGATION     Social History   Socioeconomic History  . Marital status: Married    Spouse name: Not on file  . Number of children: Not on file  . Years of education: Not on file  . Highest education level: Not on file  Occupational History  . Not on file  Social Needs  . Financial resource strain: Not on file  . Food insecurity:    Worry: Not on file    Inability: Not on file  . Transportation needs:    Medical: Not on file    Non-medical: Not on file  Tobacco Use  . Smoking status: Never Smoker  . Smokeless tobacco: Never Used  Substance and Sexual Activity  . Alcohol use: No  . Drug use: No  . Sexual activity: Yes  Lifestyle  . Physical activity:    Days per week: Not on file    Minutes per session: Not on file  . Stress: Not on file  Relationships  . Social connections:    Talks on phone: Not on file    Gets together: Not on file    Attends religious service: Not on file    Active member of club or organization: Not on file    Attends meetings of clubs or organizations: Not on file    Relationship status: Not on file  Other Topics Concern  . Not on file  Social History Narrative   . Not on file   Outpatient Encounter Medications as of 04/19/2018  Medication Sig  . aspirin EC 81 MG tablet Take 81 mg daily by mouth.  . B Complex Vitamins (VITAMIN B COMPLEX PO) Take daily by mouth.  . Cinnamon 500 MG capsule Take 1,000 mg by mouth daily.  . Dulaglutide (TRULICITY) 1.5 RD/4.0CX SOPN Inject 1.5 mg into the skin once a week.  . gabapentin (NEURONTIN) 300 MG capsule Take 900 mg 2 (two) times daily by mouth.  Marland Kitchen lisinopril-hydrochlorothiazide (PRINZIDE,ZESTORETIC) 10-12.5 MG tablet Take 1 tablet by mouth daily.  . metFORMIN (GLUCOPHAGE) 500 MG tablet Take 2 tablets 2 (two) times daily with a meal by mouth. 1000 mg in the a.m & 1000 mg in the p.m.   . Multiple Vitamin (MULTIVITAMIN) tablet Take 1 tablet daily by mouth.  Marland Kitchen omeprazole (PRILOSEC)  20 MG capsule Take 20 mg daily by mouth.  . pramipexole (MIRAPEX) 0.125 MG tablet Take 0.125 mg by mouth at bedtime. For RLS  . pravastatin (PRAVACHOL) 40 MG tablet Take 40 mg by mouth daily.  . traZODone (DESYREL) 50 MG tablet Take 50 mg by mouth at bedtime.  Marland Kitchen venlafaxine (EFFEXOR) 75 MG tablet 37.5 mg 2 (two) times daily.  . [DISCONTINUED] Dulaglutide 0.75 MG/0.5ML SOPN Inject 0.75 mg into the skin once a week.   No facility-administered encounter medications on file as of 04/19/2018.     ALLERGIES: Allergies  Allergen Reactions  . Bee Venom Shortness Of Breath, Itching and Swelling    VACCINATION STATUS: Immunization History  Administered Date(s) Administered  . Influenza Split 05/07/2013    Diabetes  She presents for her follow-up diabetic visit. She has type 2 diabetes mellitus. Onset time: She was diagnosed at approximate age of 68 years. Her disease course has been worsening. There are no hypoglycemic associated symptoms. Pertinent negatives for hypoglycemia include no confusion, headaches, pallor or seizures. Associated symptoms include blurred vision, fatigue, polydipsia and polyuria. Pertinent negatives for diabetes  include no chest pain and no polyphagia. There are no hypoglycemic complications. Symptoms are improving. There are no diabetic complications. Risk factors for coronary artery disease include dyslipidemia, diabetes mellitus, obesity, sedentary lifestyle and post-menopausal. Current diabetic treatment includes oral agent (monotherapy) (She could not afford Trulicity nor Bydureon.). Her weight is stable. She is following a generally unhealthy diet. When asked about meal planning, she reported none. She has not had a previous visit with a dietitian. She never participates in exercise. Her breakfast blood glucose range is generally 140-180 mg/dl. Her lunch blood glucose range is generally 140-180 mg/dl. Her dinner blood glucose range is generally 140-180 mg/dl. Her bedtime blood glucose range is generally 140-180 mg/dl. Her overall blood glucose range is 140-180 mg/dl. An ACE inhibitor/angiotensin II receptor blocker is being taken. She does not see a podiatrist.Eye exam is current (She has not seen her ophthalmologist in 2 years, I urged to reschedule a visit.).  Hyperlipidemia  This is a chronic problem. The current episode started more than 1 year ago. The problem is controlled. Exacerbating diseases include diabetes and obesity. Pertinent negatives include no chest pain, myalgias or shortness of breath. Current antihyperlipidemic treatment includes statins. Risk factors for coronary artery disease include diabetes mellitus, dyslipidemia, obesity, a sedentary lifestyle and post-menopausal.    Review of Systems  Constitutional: Positive for fatigue. Negative for chills, fever and unexpected weight change.  HENT: Negative for trouble swallowing and voice change.   Eyes: Positive for blurred vision. Negative for visual disturbance.  Respiratory: Negative for cough, shortness of breath and wheezing.   Cardiovascular: Negative for chest pain, palpitations and leg swelling.  Gastrointestinal: Negative for  diarrhea, nausea and vomiting.  Endocrine: Positive for polydipsia and polyuria. Negative for cold intolerance, heat intolerance and polyphagia.  Musculoskeletal: Negative for arthralgias and myalgias.  Skin: Negative for color change, pallor, rash and wound.  Neurological: Negative for seizures and headaches.  Psychiatric/Behavioral: Negative for confusion and suicidal ideas.    Objective:    BP 129/83   Pulse 80   Ht 5\' 7"  (1.702 m)   Wt 273 lb (123.8 kg)   LMP 03/19/2011   BMI 42.76 kg/m   Wt Readings from Last 3 Encounters:  04/19/18 273 lb (123.8 kg)  04/19/18 273 lb (123.8 kg)  04/08/18 273 lb 3.2 oz (123.9 kg)     Physical Exam  Constitutional:  She is oriented to person, place, and time. She appears well-developed.  HENT:  Head: Normocephalic and atraumatic.  Poor dental condition.  Eyes: EOM are normal.  Neck: Normal range of motion. Neck supple. No tracheal deviation present. No thyromegaly present.  Cardiovascular: Normal rate.  Pulmonary/Chest: Effort normal.  Abdominal: Bowel sounds are normal. There is no tenderness. There is no guarding.  Musculoskeletal: Normal range of motion. She exhibits no edema.  Neurological: She is alert and oriented to person, place, and time. No cranial nerve deficit. Coordination normal.  Skin: Skin is warm and dry. No rash noted. No erythema. No pallor.  Psychiatric: She has a normal mood and affect. Judgment normal.   Recent Results (from the past 2160 hour(s))  COMPLETE METABOLIC PANEL WITH GFR     Status: Abnormal   Collection Time: 04/02/18 11:18 AM  Result Value Ref Range   Glucose, Bld 227 (H) 65 - 99 mg/dL    Comment: .            Fasting reference interval . For someone without known diabetes, a glucose value >125 mg/dL indicates that they may have diabetes and this should be confirmed with a follow-up test. .    BUN 17 7 - 25 mg/dL   Creat 0.93 0.50 - 1.05 mg/dL    Comment: For patients >63 years of age, the  reference limit for Creatinine is approximately 13% higher for people identified as African-American. .    GFR, Est Non African American 67 > OR = 60 mL/min/1.38m2   GFR, Est African American 78 > OR = 60 mL/min/1.22m2   BUN/Creatinine Ratio NOT APPLICABLE 6 - 22 (calc)   Sodium 141 135 - 146 mmol/L   Potassium 4.7 3.5 - 5.3 mmol/L   Chloride 106 98 - 110 mmol/L   CO2 27 20 - 32 mmol/L   Calcium 10.2 8.6 - 10.4 mg/dL   Total Protein 6.9 6.1 - 8.1 g/dL   Albumin 4.0 3.6 - 5.1 g/dL   Globulin 2.9 1.9 - 3.7 g/dL (calc)   AG Ratio 1.4 1.0 - 2.5 (calc)   Total Bilirubin 0.4 0.2 - 1.2 mg/dL   Alkaline phosphatase (APISO) 85 33 - 130 U/L   AST 24 10 - 35 U/L   ALT 21 6 - 29 U/L  Hemoglobin A1c     Status: Abnormal   Collection Time: 04/02/18 11:18 AM  Result Value Ref Range   Hgb A1c MFr Bld 9.3 (H) <5.7 % of total Hgb    Comment: For someone without known diabetes, a hemoglobin A1c value of 6.5% or greater indicates that they may have  diabetes and this should be confirmed with a follow-up  test. . For someone with known diabetes, a value <7% indicates  that their diabetes is well controlled and a value  greater than or equal to 7% indicates suboptimal  control. A1c targets should be individualized based on  duration of diabetes, age, comorbid conditions, and  other considerations. . Currently, no consensus exists regarding use of hemoglobin A1c for diagnosis of diabetes for children. .    Mean Plasma Glucose 220 (calc)   eAG (mmol/L) 12.2 (calc)    Lipid Panel     Component Value Date/Time   CHOL 130 08/05/2017 1133   TRIG 177 (H) 08/05/2017 1133   HDL 39 (L) 08/05/2017 1133   CHOLHDL 3.3 08/05/2017 1133   LDLCALC 66 08/05/2017 1133    Assessment & Plan:   1. Uncontrolled type 2 diabetes  mellitus with hyperglycemia ([place])  - Tina Sampson has currently uncontrolled symptomatic type 2 DM since  57 years of age. -She returns with better glycemic profile on  metformin and a sample of Trulicity given to her from clinic.  Her recent A1c was 9.3%, increasing from 7.8%.    Recent labs reviewed.  -her diabetes is complicated by obesity/sedentary life and Tina Sampson remains at a high risk for more acute and chronic complications which include CAD, CVA, CKD, retinopathy, and neuropathy. These are all discussed in detail with the patient.  - I have counseled her on diet management and weight loss, by adopting a carbohydrate restricted/protein rich diet.  -She admits to dietary indiscretion including consumption of sweetened beverages. --  Suggestion is made for her to avoid simple carbohydrates  from her diet including Cakes, Sweet Desserts / Pastries, Ice Cream, Soda (diet and regular), Sweet Tea, Candies, Chips, Cookies, Store Bought Juices, Alcohol in Excess of  1-2 drinks a day, Artificial Sweeteners, and "Sugar-free" Products. This will help patient to have stable blood glucose profile and potentially avoid unintended weight gain.   - I encouraged her to switch to  unprocessed or minimally processed complex starch and increased protein intake (animal or plant source), fruits, and vegetables.  - she is advised to stick to a routine mealtimes to eat 3 meals  a day and avoid unnecessary snacks ( to snack only to correct hypoglycemia).   - I have approached her with the following individualized plan to manage diabetes and patient agrees:   -Based on her prevailing glycemic burden, she will need either basal insulin daily or weekly GLP-1 receptor agonists.  -And declines the offer for basal insulin.  And she reports concern on co-pays of Trulicity, however asking for prescription for it rather than going on insulin. -I gave her more samples of Trulicity from clinic and prescribed Trulicity 1.5 mg subcutaneously weekly.   - I advised her to continue metformin 1000 mg p.o. twice daily after breakfast and supper. -She is asked to continue monitoring  blood glucose daily before breakfast and report if readings are above 200 mg/dL.  - Patient specific target  A1c;  LDL, HDL, Triglycerides, and  Waist Circumference were discussed in detail.  2) BP/HTN: Her blood pressure is controlled to target.  I advised her to continue lisinopril/HCTZ 10/12.5 mg p.o. Daily.  3) Lipids/HPL:   Her fasting lipid panel shows LDL at 66. She is advised to continue pravastatin 40 mg p.o. nightly.    4)  Weight/Diet: CDE Consult has been initiated- consult is in progress, exercise, and detailed carbohydrates information provided.  5) Chronic Care/Health Maintenance:  -she  is on  Statin medications and   lisinopril is encouraged to continue to follow up with Ophthalmology, Dentist,  Podiatrist at least yearly or according to recommendations, and advised to  stay away from smoking. I have recommended yearly flu vaccine and pneumonia vaccination at least every 5 years; moderate intensity exercise for up to 150 minutes weekly; and  sleep for at least 7 hours a day. - I advised her to seek referral to a dentist.   - I advised patient to maintain close follow up with Deloria Lair., MD for primary care needs.  - Time spent with the patient: 25 min, of which >50% was spent in reviewing her blood glucose logs , discussing her hypo- and hyper-glycemic episodes, reviewing her current and  previous labs and insulin doses and developing a plan to  avoid hypo- and hyper-glycemia. Please refer to Patient Instructions for Blood Glucose Monitoring and Insulin/Medications Dosing Guide"  in media tab for additional information. [name] participated in the discussions, expressed understanding, and voiced agreement with the above plans.  All questions were answered to her satisfaction. she is encouraged to contact clinic should she have any questions or concerns prior to her return visit.   Follow up plan: - Return in about 3 months (around 07/19/2018) for Follow up with  Pre-visit Labs, Meter, and Logs.  Glade Lloyd, MD [facility] [address] [place], [place] [address] Phone: [phone]  Fax: [phone]    04/19/2018, 1:11 PM  This note was partially dictated with voice recognition software. Similar sounding words can be transcribed inadequately or may not  be corrected upon review.

## 2018-04-19 NOTE — Progress Notes (Signed)
  Medical Nutrition Therapy:  F/u  Appt start time:1000 end time: 1030Assessment:  Primary concerns today: Diabetes. Type 2.  Baking more foods.A1C up to 9.3%.  Working on Museum/gallery conservator; Exercising on [employer] more Drinking a lot more water. Has cut out tea which was causing BS to go up. [id number]'D in am. AVg 179 before meals.  BS 150-160's at night. Metformin 500 mg BID. Trulicty.started. Sees Dr. Dorris Fetch, Endocrinologu. Needs more lower carb vegetables. DIet is higher in sodium and processed meats. Willing to start using MyFitness pal to track foods   CMP Latest Ref Rng & Units 04/02/2018 12/17/2017 11/03/2017  Glucose 65 - 99 mg/dL 227(H) - 198(H)  BUN 7 - 25 mg/dL 17 - 13  Creatinine 0.50 - 1.05 mg/dL 0.93 0.70 0.93  Sodium 135 - 146 mmol/L 141 - 141  Potassium 3.5 - 5.3 mmol/L 4.7 - 5.2  Chloride 98 - 110 mmol/L 106 - 105  CO2 20 - 32 mmol/L 27 - 28  Calcium 8.6 - 10.4 mg/dL 10.2 - 9.6  Total Protein 6.1 - 8.1 g/dL 6.9 - 7.2  Total Bilirubin 0.2 - 1.2 mg/dL 0.4 - 0.5  AST 10 - 35 U/L 24 - 19  ALT 6 - 29 U/L 21 - 20   Lab Results  Component Value Date   HGBA1C 9.3 (H) 04/02/2018   Vitals with BMI 04/19/2018  Height 5\' 7"   Weight 273 lbs  BMI 98.33  Systolic   Diastolic   Pulse   Respirations    Preferred Learning Style:   No preference indicated   Learning Readiness:   Ready  Change in progress   MEDICATIONS: See list  DIETARY INTAKE:   24-hr recall:  B ( AM): skipped Snk ( AM):  L ( PM):BBQ plate chopped , coleslaw and Diet sodas Snk ( PM):  D ( PM): Pizza supreme 2 slices, Diet sodas Snk ( PM):  Beverages: water Usual physical activity:  Mows yards and weedeats.   Estimated energy needs: 1500  calories 170 g carbohydrates 112 g protein 42 g fat  Progress Towards Goal(s):  In progress.   Nutritional Diagnosis:  NB-1.1 Food and nutrition-related knowledge deficit As related to Diabetes, Obesity.  As evidenced by A1C 8.8 and BMI> 40.     Intervention: Nutrition and Diabetes education provided on My Plate, CHO counting, meal planning, portion sizes, timing of meals, avoiding snacks between meals unless having a low blood sugar, target ranges for A1C and blood sugars, signs/symptoms and treatment of hyper/hypoglycemia, monitoring blood sugars, taking medications as prescribed, benefits of exercising 30 minutes per day and prevention of complications of DM.  Goals  Keep food journal daily  MYfitnessPal Keep eating more vegetables Increase exercise Get BS below 130 in am and 150 mg/dl before bed.  Teaching Method Utilized:  Visual Auditory Hands on  Handouts given during visit include:  The Plate Method   Meal Plan Card  Diabetes Instrucitons.   Barriers to learning/adherence to lifestyle change: none  Demonstrated degree of understanding via:  Teach Back   Monitoring/Evaluation:  Dietary intake, exercise, meal planning, sbg, and body weight in 3 months.

## 2018-04-20 ENCOUNTER — Encounter: Payer: Self-pay | Admitting: Nutrition

## 2018-06-12 ENCOUNTER — Other Ambulatory Visit: Payer: Self-pay | Admitting: "Endocrinology

## 2018-07-14 LAB — VITAMIN D 25 HYDROXY (VIT D DEFICIENCY, FRACTURES): VIT D 25 HYDROXY: 34 ng/mL (ref 30–100)

## 2018-07-14 LAB — HEMOGLOBIN A1C
EAG (MMOL/L): 8.2 (calc)
Hgb A1c MFr Bld: 6.8 % of total Hgb — ABNORMAL HIGH (ref <5.7)
Mean Plasma Glucose: 148 (calc)

## 2018-07-14 LAB — COMPLETE METABOLIC PANEL WITH GFR
AG Ratio: 1.4 (calc) (ref 1.0–2.5)
ALKALINE PHOSPHATASE (APISO): 70 U/L (ref 33–130)
ALT: 16 U/L (ref 6–29)
AST: 19 U/L (ref 10–35)
Albumin: 4.1 g/dL (ref 3.6–5.1)
BUN: 11 mg/dL (ref 7–25)
CO2: 29 mmol/L (ref 20–32)
Calcium: 9.7 mg/dL (ref 8.6–10.4)
Chloride: 106 mmol/L (ref 98–110)
Creat: 0.8 mg/dL (ref 0.50–1.05)
GFR, EST NON AFRICAN AMERICAN: 81 mL/min/{1.73_m2} (ref >=60)
GFR, Est African American: 94 mL/min/{1.73_m2} (ref >=60)
Globulin: 3 g/dL (calc) (ref 1.9–3.7)
Glucose, Bld: 140 mg/dL — ABNORMAL HIGH (ref 65–99)
Potassium: 4.3 mmol/L (ref 3.5–5.3)
Sodium: 143 mmol/L (ref 135–146)
Total Bilirubin: 0.3 mg/dL (ref 0.2–1.2)
Total Protein: 7.1 g/dL (ref 6.1–8.1)

## 2018-07-14 LAB — LIPID PANEL
CHOL/HDL RATIO: 3.4 (calc) (ref <5.0)
Cholesterol: 133 mg/dL (ref <200)
HDL: 39 mg/dL — ABNORMAL LOW (ref >50)
LDL Cholesterol (Calc): 69 mg/dL (calc)
NON-HDL CHOLESTEROL (CALC): 94 mg/dL (ref <130)
Triglycerides: 172 mg/dL — ABNORMAL HIGH (ref <150)

## 2018-07-14 LAB — MICROALBUMIN / CREATININE URINE RATIO
Creatinine, Urine: 134 mg/dL (ref 20–275)
Microalb Creat Ratio: 3 mcg/mg creat (ref <30)
Microalb, Ur: 0.4 mg/dL

## 2018-07-14 LAB — TSH: TSH: 1.76 m[IU]/L (ref 0.40–4.50)

## 2018-07-14 LAB — T4, FREE: FREE T4: 1 ng/dL (ref 0.8–1.8)

## 2018-07-20 ENCOUNTER — Encounter: Payer: Self-pay | Admitting: Nutrition

## 2018-07-20 ENCOUNTER — Other Ambulatory Visit: Payer: Self-pay | Admitting: "Endocrinology

## 2018-07-20 ENCOUNTER — Encounter: Payer: Self-pay | Admitting: "Endocrinology

## 2018-07-20 ENCOUNTER — Encounter: Payer: Medicare HMO | Attending: Family Medicine | Admitting: Nutrition

## 2018-07-20 ENCOUNTER — Ambulatory Visit (INDEPENDENT_AMBULATORY_CARE_PROVIDER_SITE_OTHER): Payer: Medicare HMO | Admitting: "Endocrinology

## 2018-07-20 VITALS — BP 136/78 | HR 78 | Ht 67.0 in | Wt 267.0 lb

## 2018-07-20 VITALS — Wt 267.0 lb

## 2018-07-20 DIAGNOSIS — E1165 Type 2 diabetes mellitus with hyperglycemia: Secondary | ICD-10-CM | POA: Insufficient documentation

## 2018-07-20 DIAGNOSIS — E118 Type 2 diabetes mellitus with unspecified complications: Secondary | ICD-10-CM | POA: Insufficient documentation

## 2018-07-20 DIAGNOSIS — IMO0002 Reserved for concepts with insufficient information to code with codable children: Secondary | ICD-10-CM

## 2018-07-20 DIAGNOSIS — Z6841 Body Mass Index (BMI) 40.0 and over, adult: Secondary | ICD-10-CM | POA: Diagnosis present

## 2018-07-20 DIAGNOSIS — E782 Mixed hyperlipidemia: Secondary | ICD-10-CM

## 2018-07-20 NOTE — Patient Instructions (Signed)

## 2018-07-20 NOTE — Progress Notes (Signed)
Endocrinology follow-up Note       07/20/2018, 1:37 PM   Subjective:    Patient ID: Tina Sampson, female    DOB: 11-27-1960.  Tina Sampson is being seen in follow-up  for management of currently uncontrolled symptomatic type 2 diabetes, hyperlipidemia, hypertension. PMD:  Deloria Lair., MD.   Past Medical History:  Diagnosis Date  . Abdominal pain 10/15/2017  . Diabetes mellitus   . Diabetes mellitus, type II ([place])   . H/O knee surgery   . Hyperlipidemia    Past Surgical History:  Procedure Laterality Date  . ANKLE SURGERY    . CARDIAC CATHETERIZATION    . catherization    . CERVICAL DISC SURGERY    . CESAREAN SECTION    . CHOLECYSTECTOMY    . KNEE SURGERY    . KNEE SURGERY    . TUBAL LIGATION     Social History   Socioeconomic History  . Marital status: Married    Spouse name: Not on file  . Number of children: Not on file  . Years of education: Not on file  . Highest education level: Not on file  Occupational History  . Not on file  Social Needs  . Financial resource strain: Not on file  . Food insecurity:    Worry: Not on file    Inability: Not on file  . Transportation needs:    Medical: Not on file    Non-medical: Not on file  Tobacco Use  . Smoking status: Never Smoker  . Smokeless tobacco: Never Used  Substance and Sexual Activity  . Alcohol use: No  . Drug use: No  . Sexual activity: Yes  Lifestyle  . Physical activity:    Days per week: Not on file    Minutes per session: Not on file  . Stress: Not on file  Relationships  . Social connections:    Talks on phone: Not on file    Gets together: Not on file    Attends religious service: Not on file    Active member of club or organization: Not on file    Attends meetings of clubs or organizations: Not on file    Relationship status: Not on file  Other Topics Concern  . Not on file  Social History Narrative   . Not on file   Outpatient Encounter Medications as of 07/20/2018  Medication Sig  . aspirin EC 81 MG tablet Take 81 mg daily by mouth.  . B Complex Vitamins (VITAMIN B COMPLEX PO) Take daily by mouth.  . Dulaglutide (TRULICITY) 1.5 VV/6.1YW SOPN Inject 1.5 mg into the skin once a week.  . gabapentin (NEURONTIN) 300 MG capsule Take 900 mg by mouth as directed.   Marland Kitchen lisinopril-hydrochlorothiazide (PRINZIDE,ZESTORETIC) 10-12.5 MG tablet TAKE 1 TABLET EVERY DAY  . metFORMIN (GLUCOPHAGE) 500 MG tablet Take 2 tablets 2 (two) times daily with a meal by mouth. 1000 mg in the a.m & 1000 mg in the p.m.   . Multiple Vitamin (MULTIVITAMIN) tablet Take 1 tablet daily by mouth.  Marland Kitchen omeprazole (PRILOSEC) 20 MG capsule Take 20 mg daily by mouth.  . pramipexole (MIRAPEX) 0.125  MG tablet Take 0.125 mg by mouth at bedtime. For RLS  . pravastatin (PRAVACHOL) 40 MG tablet Take 40 mg by mouth daily.  . traZODone (DESYREL) 50 MG tablet Take 50 mg by mouth at bedtime.  Marland Kitchen venlafaxine (EFFEXOR) 75 MG tablet 37.5 mg 2 (two) times daily.  . [DISCONTINUED] Cinnamon 500 MG capsule Take 1,000 mg by mouth daily.   No facility-administered encounter medications on file as of 07/20/2018.     ALLERGIES: Allergies  Allergen Reactions  . Bee Venom Shortness Of Breath, Itching and Swelling    VACCINATION STATUS: Immunization History  Administered Date(s) Administered  . Influenza Split 05/07/2013    Diabetes  She presents for her follow-up diabetic visit. She has type 2 diabetes mellitus. Onset time: She was diagnosed at approximate age of 8 years. Her disease course has been worsening. There are no hypoglycemic associated symptoms. Pertinent negatives for hypoglycemia include no confusion, headaches, pallor or seizures. Associated symptoms include blurred vision, fatigue, polydipsia and polyuria. Pertinent negatives for diabetes include no chest pain and no polyphagia. There are no hypoglycemic complications.  Symptoms are improving. There are no diabetic complications. Risk factors for coronary artery disease include dyslipidemia, diabetes mellitus, obesity, sedentary lifestyle and post-menopausal. Current diabetic treatment includes oral agent (monotherapy) (She could not afford Trulicity nor Bydureon.). Her weight is fluctuating minimally. She is following a generally unhealthy diet. When asked about meal planning, she reported none. She has not had a previous visit with a dietitian. She never participates in exercise. Her overall blood glucose range is 130-140 mg/dl. An ACE inhibitor/angiotensin II receptor blocker is being taken. She does not see a podiatrist.Eye exam is current (She has not seen her ophthalmologist in 2 years, I urged to reschedule a visit.).  Hyperlipidemia  This is a chronic problem. The current episode started more than 1 year ago. The problem is controlled. Exacerbating diseases include diabetes and obesity. Pertinent negatives include no chest pain, myalgias or shortness of breath. Current antihyperlipidemic treatment includes statins. Risk factors for coronary artery disease include diabetes mellitus, dyslipidemia, obesity, a sedentary lifestyle and post-menopausal.    Review of Systems  Constitutional: Positive for fatigue. Negative for chills, fever and unexpected weight change.  HENT: Negative for trouble swallowing and voice change.   Eyes: Positive for blurred vision. Negative for visual disturbance.  Respiratory: Negative for cough, shortness of breath and wheezing.   Cardiovascular: Negative for chest pain, palpitations and leg swelling.  Gastrointestinal: Negative for diarrhea, nausea and vomiting.  Endocrine: Positive for polydipsia and polyuria. Negative for cold intolerance, heat intolerance and polyphagia.  Musculoskeletal: Negative for arthralgias and myalgias.  Skin: Negative for color change, pallor, rash and wound.  Neurological: Negative for seizures and  headaches.  Psychiatric/Behavioral: Negative for confusion and suicidal ideas.    Objective:    BP 136/78   Pulse 78   Ht 5\' 7"  (1.702 m)   Wt 267 lb (121.1 kg)   LMP 03/19/2011   BMI 41.82 kg/m   Wt Readings from Last 3 Encounters:  07/20/18 267 lb (121.1 kg)  07/20/18 267 lb (121.1 kg)  04/19/18 273 lb (123.8 kg)     Physical Exam  Constitutional: She is oriented to person, place, and time. She appears well-developed.  HENT:  Head: Normocephalic and atraumatic.  Poor dental condition.  Eyes: EOM are normal.  Neck: Normal range of motion. Neck supple. No tracheal deviation present. No thyromegaly present.  Cardiovascular: Normal rate.  Pulmonary/Chest: Effort normal.  Abdominal: Bowel sounds  are normal. There is no abdominal tenderness. There is no guarding.  Musculoskeletal: Normal range of motion.        General: No edema.  Neurological: She is alert and oriented to person, place, and time. No cranial nerve deficit. Coordination normal.  Skin: Skin is warm and dry. No rash noted. No erythema. No pallor.  Psychiatric: She has a normal mood and affect. Judgment normal.  Suboptimal general hygiene.   Recent Results (from the past 2160 hour(s))  Hemoglobin A1c     Status: Abnormal   Collection Time: 07/13/18  8:59 AM  Result Value Ref Range   Hgb A1c MFr Bld 6.8 (H) <5.7 % of total Hgb    Comment: For someone without known diabetes, a hemoglobin A1c value of 6.5% or greater indicates that they may have  diabetes and this should be confirmed with a follow-up  test. . For someone with known diabetes, a value <7% indicates  that their diabetes is well controlled and a value  greater than or equal to 7% indicates suboptimal  control. A1c targets should be individualized based on  duration of diabetes, age, comorbid conditions, and  other considerations. . Currently, no consensus exists regarding use of hemoglobin A1c for diagnosis of diabetes for children. .     Mean Plasma Glucose 148 (calc)   eAG (mmol/L) 8.2 (calc)  COMPLETE METABOLIC PANEL WITH GFR     Status: Abnormal   Collection Time: 07/13/18  8:59 AM  Result Value Ref Range   Glucose, Bld 140 (H) 65 - 99 mg/dL    Comment: .            Fasting reference interval . For someone without known diabetes, a glucose value >125 mg/dL indicates that they may have diabetes and this should be confirmed with a follow-up test. .    BUN 11 7 - 25 mg/dL   Creat 0.80 0.50 - 1.05 mg/dL    Comment: For patients >60 years of age, the reference limit for Creatinine is approximately 13% higher for people identified as African-American. .    GFR, Est Non African American 81 > OR = 60 mL/min/1.70m2   GFR, Est African American 94 > OR = 60 mL/min/1.56m2   BUN/Creatinine Ratio NOT APPLICABLE 6 - 22 (calc)   Sodium 143 135 - 146 mmol/L   Potassium 4.3 3.5 - 5.3 mmol/L   Chloride 106 98 - 110 mmol/L   CO2 29 20 - 32 mmol/L   Calcium 9.7 8.6 - 10.4 mg/dL   Total Protein 7.1 6.1 - 8.1 g/dL   Albumin 4.1 3.6 - 5.1 g/dL   Globulin 3.0 1.9 - 3.7 g/dL (calc)   AG Ratio 1.4 1.0 - 2.5 (calc)   Total Bilirubin 0.3 0.2 - 1.2 mg/dL   Alkaline phosphatase (APISO) 70 33 - 130 U/L   AST 19 10 - 35 U/L   ALT 16 6 - 29 U/L  Microalbumin / creatinine urine ratio     Status: None   Collection Time: 07/13/18  8:59 AM  Result Value Ref Range   Creatinine, Urine 134 20 - 275 mg/dL   Microalb, Ur 0.4 mg/dL    Comment: Reference Range Not established    Microalb Creat Ratio 3 <30 mcg/mg creat    Comment: . The ADA defines abnormalities in albumin excretion as follows: Marland Kitchen Category         Result (mcg/mg creatinine) . Normal                    <  30 Microalbuminuria         30-299  Clinical albuminuria   > OR = 300 . The ADA recommends that at least two of three specimens collected within a 3-6 month period be abnormal before considering a patient to be within a diagnostic category.   Lipid panel     Status:  Abnormal   Collection Time: 07/13/18  8:59 AM  Result Value Ref Range   Cholesterol 133 <200 mg/dL   HDL 39 (L) >50 mg/dL   Triglycerides 172 (H) <150 mg/dL   LDL Cholesterol (Calc) 69 mg/dL (calc)    Comment: Reference range: <100 . Desirable range <100 mg/dL for primary prevention;   <70 mg/dL for patients with CHD or diabetic patients  with > or = 2 CHD risk factors. Marland Kitchen LDL-C is now calculated using the Martin-Hopkins  calculation, which is a validated novel method providing  better accuracy than the Friedewald equation in the  estimation of LDL-C.  Cresenciano Genre et al. Annamaria Helling. 0947;096(28): 2061-2068  (http://education.[facility].com/faq/FAQ164)    Total CHOL/HDL Ratio 3.4 <5.0 (calc)   Non-HDL Cholesterol (Calc) 94 <130 mg/dL (calc)    Comment: For patients with diabetes plus 1 major ASCVD risk  factor, treating to a non-HDL-C goal of <100 mg/dL  (LDL-C of <70 mg/dL) is considered a therapeutic  option.   VITAMIN D 25 Hydroxy (Vit-D Deficiency, Fractures)     Status: None   Collection Time: 07/13/18  8:59 AM  Result Value Ref Range   Vit D, 25-Hydroxy 34 30 - 100 ng/mL    Comment: Vitamin D Status         25-OH Vitamin D: . Deficiency:                    <20 ng/mL Insufficiency:             20 - 29 ng/mL Optimal:                 > or = 30 ng/mL . For 25-OH Vitamin D testing on patients on  D2-supplementation and patients for whom quantitation  of D2 and D3 fractions is required, the QuestAssureD(TM) 25-OH VIT D, (D2,D3), LC/MS/MS is recommended: order  code [id number] (patients >66yrs). . For more information on this test, go to: http://education.[facility].com/faq/FAQ163 (This link is being provided for  informational/educational purposes only.)   TSH     Status: None   Collection Time: 07/13/18  8:59 AM  Result Value Ref Range   TSH 1.76 0.40 - 4.50 mIU/L  T4, free     Status: None   Collection Time: 07/13/18  8:59 AM  Result Value Ref Range   Free T4 1.0  0.8 - 1.8 ng/dL    Lipid Panel     Component Value Date/Time   CHOL 133 07/13/2018 0859   TRIG 172 (H) 07/13/2018 0859   HDL 39 (L) 07/13/2018 0859   CHOLHDL 3.4 07/13/2018 0859   LDLCALC 69 07/13/2018 0859    Assessment & Plan:   1. Uncontrolled type 2 diabetes mellitus with hyperglycemia ([place])  - Tina Sampson has currently uncontrolled symptomatic type 2 DM since  57 years of age. -She returns with better glycemic profile on metformin and a sample of Trulicity given to her from clinic.  Her recent A1c has decreased to 6.8% from 9.3%.     Recent labs reviewed.  -her diabetes is complicated by obesity/sedentary life and Tina Sampson remains at a high risk for more  acute and chronic complications which include CAD, CVA, CKD, retinopathy, and neuropathy. These are all discussed in detail with the patient.  - I have counseled her on diet management and weight loss, by adopting a carbohydrate restricted/protein rich diet.  -  Suggestion is made for her to avoid simple carbohydrates  from her diet including Cakes, Sweet Desserts / Pastries, Ice Cream, Soda (diet and regular), Sweet Tea, Candies, Chips, Cookies, Store Bought Juices, Alcohol in Excess of  1-2 drinks a day, Artificial Sweeteners, and "Sugar-free" Products. This will help patient to have stable blood glucose profile and potentially avoid unintended weight gain.  - I encouraged her to switch to  unprocessed or minimally processed complex starch and increased protein intake (animal or plant source), fruits, and vegetables.  - she is advised to stick to a routine mealtimes to eat 3 meals  a day and avoid unnecessary snacks ( to snack only to correct hypoglycemia).   - I have approached her with the following individualized plan to manage diabetes and patient agrees:   -Based on her presentation with controlled A1c of 6.8%, she will not need insulin treatment at this time.    -  And she reports concern on co-pays of  Trulicity, she is asking for more samples from clinic.   -I gave her more samples of Trulicity from clinic and prescribed Trulicity 1.5 mg subcutaneously weekly.   -She is advised to continue metformin 1000 mg p.o. twice daily after breakfast and supper.  - Patient specific target  A1c;  LDL, HDL, Triglycerides, and  Waist Circumference were discussed in detail.  2) BP/HTN: Her blood pressure is controlled to target.  She is advised to continue  lisinopril/HCTZ 10/12.5 mg p.o. Daily.  3) Lipids/HPL:   Her fasting lipid panel shows LDL at 66.  She is advised to continue pravastatin 40 mg p.o. nightly.    4)  Weight/Diet: CDE Consult has been initiated- consult is in progress, exercise, and detailed carbohydrates information provided.  5) Chronic Care/Health Maintenance:  -she  is on  Statin medications and   lisinopril is encouraged to continue to follow up with Ophthalmology, Dentist,  Podiatrist at least yearly or according to recommendations, and advised to  stay away from smoking. I have recommended yearly flu vaccine and pneumonia vaccination at least every 5 years; moderate intensity exercise for up to 150 minutes weekly; and  sleep for at least 7 hours a day. - I advised her to seek referral to a dentist.   - I advised patient to maintain close follow up with Deloria Lair., MD for primary care needs.  - Time spent with the patient: 25 min, of which >50% was spent in reviewing her blood glucose logs , discussing her hypo- and hyper-glycemic episodes, reviewing her current and  previous labs and insulin doses and developing a plan to avoid hypo- and hyper-glycemia. Please refer to Patient Instructions for Blood Glucose Monitoring and Insulin/Medications Dosing Guide"  in media tab for additional information. Tina Sampson participated in the discussions, expressed understanding, and voiced agreement with the above plans.  All questions were answered to her satisfaction. she is  encouraged to contact clinic should she have any questions or concerns prior to her return visit.   Follow up plan: - Return in about 4 months (around 11/19/2018) for Follow up with Pre-visit Labs.  Glade Lloyd, MD [facility] [address] [place], [place] [address] Phone: [phone]  Fax: [phone]  07/20/2018, 1:37 PM  This note was partially dictated with voice recognition software. Similar sounding words can be transcribed inadequately or may not  be corrected upon review.

## 2018-07-20 NOTE — Progress Notes (Signed)
  Medical Nutrition Therapy:  F/u  Appt start time:1000 end time: 1030Assessment:  Primary concerns today: Diabetes. Type 2.   A1C down to 6.8 % from 9.3%. Lost 6 lbs. Eating more baked and broiled foods. Drinking more water.  Walking some. Making progress. Metformin 4481 mg BID and Trulicity.   CMP Latest Ref Rng & Units 07/13/2018 04/02/2018 12/17/2017  Glucose 65 - 99 mg/dL 140(H) 227(H) -  BUN 7 - 25 mg/dL 11 17 -  Creatinine 0.50 - 1.05 mg/dL 0.80 0.93 0.70  Sodium 135 - 146 mmol/L 143 141 -  Potassium 3.5 - 5.3 mmol/L 4.3 4.7 -  Chloride 98 - 110 mmol/L 106 106 -  CO2 20 - 32 mmol/L 29 27 -  Calcium 8.6 - 10.4 mg/dL 9.7 10.2 -  Total Protein 6.1 - 8.1 g/dL 7.1 6.9 -  Total Bilirubin 0.2 - 1.2 mg/dL 0.3 0.4 -  AST 10 - 35 U/L 19 24 -  ALT 6 - 29 U/L 16 21 -   Lab Results  Component Value Date   HGBA1C 6.8 (H) 07/13/2018   Vitals with BMI 04/19/2018  Height 5\' 7"   Weight 273 lbs  BMI 85.63  Systolic   Diastolic   Pulse   Respirations    Preferred Learning Style:   No preference indicated   Learning Readiness:   Ready  Change in progress   MEDICATIONS: See list  DIETARY INTAKE:   24-hr recall:   Eating more baked and broiled foods.  Dinner last night : Chef salad.  Beverages: water Usual physical activity:  Mows yards and weedeats.   Estimated energy needs: 1500  calories 170 g carbohydrates 112 g protein 42 g fat  Progress Towards Goal(s):  In progress.   Nutritional Diagnosis:  NB-1.1 Food and nutrition-related knowledge deficit As related to Diabetes, Obesity.  As evidenced by A1C 8.8 and BMI> 40.    Intervention: Nutrition and Diabetes education provided on My Plate, CHO counting, meal planning, portion sizes, timing of meals, avoiding snacks between meals unless having a low blood sugar, target ranges for A1C and blood sugars, signs/symptoms and treatment of hyper/hypoglycemia, monitoring blood sugars, taking medications as prescribed, benefits of  exercising 30 minutes per day and prevention of complications of DM.  Goals Keep up  The [employer]  Lose 3-5 lbs per month Increase high fiber foods Avoid snacks Increase exercise  Teaching Method Utilized:  Visual Auditory Hands on  Handouts given during visit include:  The Plate Method   Meal Plan Card  Diabetes Instrucitons.   Barriers to learning/adherence to lifestyle change: none  Demonstrated degree of understanding via:  Teach Back   Monitoring/Evaluation:  Dietary intake, exercise, meal planning, sbg, and body weight in 3 months.

## 2018-07-20 NOTE — Patient Instructions (Signed)
Goals Keep up  [employer]  Lose 3-5 lbs per month Increase high fiber foods Avoid snacks Increase exercise

## 2018-10-09 ENCOUNTER — Other Ambulatory Visit: Payer: Self-pay | Admitting: "Endocrinology

## 2018-11-17 LAB — COMPLETE METABOLIC PANEL WITH GFR
AG Ratio: 1.3 (calc) (ref 1.0–2.5)
ALKALINE PHOSPHATASE (APISO): 76 U/L (ref 37–153)
ALT: 11 U/L (ref 6–29)
AST: 15 U/L (ref 10–35)
Albumin: 3.9 g/dL (ref 3.6–5.1)
BUN: 13 mg/dL (ref 7–25)
CALCIUM: 9.2 mg/dL (ref 8.6–10.4)
CO2: 28 mmol/L (ref 20–32)
CREATININE: 0.75 mg/dL (ref 0.50–0.99)
Chloride: 104 mmol/L (ref 98–110)
GFR, Est African American: 100 mL/min/{1.73_m2} (ref >=60)
GFR, Est Non African American: 87 mL/min/{1.73_m2} (ref >=60)
GLUCOSE: 134 mg/dL (ref 65–139)
Globulin: 3.1 g/dL (calc) (ref 1.9–3.7)
Potassium: 4.1 mmol/L (ref 3.5–5.3)
Sodium: 138 mmol/L (ref 135–146)
Total Bilirubin: 0.5 mg/dL (ref 0.2–1.2)
Total Protein: 7 g/dL (ref 6.1–8.1)

## 2018-11-17 LAB — HEMOGLOBIN A1C
Hgb A1c MFr Bld: 6.4 % of total Hgb — ABNORMAL HIGH (ref <5.7)
Mean Plasma Glucose: 137 (calc)
eAG (mmol/L): 7.6 (calc)

## 2018-11-22 ENCOUNTER — Encounter: Payer: Self-pay | Admitting: "Endocrinology

## 2018-11-23 ENCOUNTER — Other Ambulatory Visit: Payer: Self-pay

## 2018-11-23 ENCOUNTER — Ambulatory Visit (INDEPENDENT_AMBULATORY_CARE_PROVIDER_SITE_OTHER): Payer: Medicare HMO | Admitting: "Endocrinology

## 2018-11-23 ENCOUNTER — Encounter: Payer: Self-pay | Admitting: "Endocrinology

## 2018-11-23 DIAGNOSIS — E1165 Type 2 diabetes mellitus with hyperglycemia: Secondary | ICD-10-CM

## 2018-11-23 DIAGNOSIS — I10 Essential (primary) hypertension: Secondary | ICD-10-CM | POA: Diagnosis not present

## 2018-11-23 DIAGNOSIS — E782 Mixed hyperlipidemia: Secondary | ICD-10-CM | POA: Diagnosis not present

## 2018-11-23 NOTE — Progress Notes (Signed)
Endocrinology Telehealth Visit Follow up Note -During COVID -19 Pandemic       11/23/2018, 12:09 PM   Subjective:    Patient ID: Tina Sampson, female    DOB: November 04, 1960.  Tina Sampson is being engaged in Telehealth  follow-up  for management of currently uncontrolled symptomatic type 2 diabetes, hyperlipidemia, hypertension. PMD:  Deloria Lair., MD.   Past Medical History:  Diagnosis Date  . Abdominal pain 10/15/2017  . Diabetes mellitus   . Diabetes mellitus, type II ([place])   . H/O knee surgery   . Hyperlipidemia    Past Surgical History:  Procedure Laterality Date  . ANKLE SURGERY    . CARDIAC CATHETERIZATION    . catherization    . CERVICAL DISC SURGERY    . CESAREAN SECTION    . CHOLECYSTECTOMY    . KNEE SURGERY    . KNEE SURGERY    . TUBAL LIGATION     Social History   Socioeconomic History  . Marital status: Married    Spouse name: Not on file  . Number of children: Not on file  . Years of education: Not on file  . Highest education level: Not on file  Occupational History  . Not on file  Social Needs  . Financial resource strain: Not on file  . Food insecurity:    Worry: Not on file    Inability: Not on file  . Transportation needs:    Medical: Not on file    Non-medical: Not on file  Tobacco Use  . Smoking status: Never Smoker  . Smokeless tobacco: Never Used  Substance and Sexual Activity  . Alcohol use: No  . Drug use: No  . Sexual activity: Yes  Lifestyle  . Physical activity:    Days per week: Not on file    Minutes per session: Not on file  . Stress: Not on file  Relationships  . Social connections:    Talks on phone: Not on file    Gets together: Not on file    Attends religious service: Not on file    Active member of club or organization: Not on file    Attends meetings of clubs or organizations: Not  on file    Relationship status: Not on file  Other Topics Concern  . Not on file  Social History Narrative  . Not on file   Outpatient Encounter Medications as of 11/23/2018  Medication Sig  . aspirin EC 81 MG tablet Take 81 mg daily by mouth.  . B Complex Vitamins (VITAMIN B COMPLEX PO) Take daily by mouth.  . gabapentin (NEURONTIN) 300 MG capsule Take 900 mg by mouth as directed.   Marland Kitchen lisinopril-hydrochlorothiazide (PRINZIDE,ZESTORETIC) 10-12.5 MG tablet TAKE 1 TABLET EVERY DAY  . metFORMIN (GLUCOPHAGE) 500 MG tablet Take 2 tablets 2 (two) times daily with a meal by mouth. [place]  mg in the a.m & 1000 mg in the p.m.   . Multiple Vitamin (MULTIVITAMIN) tablet Take 1 tablet daily by mouth.  Marland Kitchen omeprazole (PRILOSEC) 20 MG capsule Take 20 mg daily by mouth.  . pramipexole (MIRAPEX) 0.125 MG tablet Take 0.125 mg by mouth at bedtime. For RLS  . pravastatin (PRAVACHOL) 40 MG tablet Take 40 mg by mouth daily.  . traZODone (DESYREL) 50 MG tablet Take 50 mg by mouth at bedtime.  . TRULICITY 1.5 HU/3.1SH SOPN INJECT 1.5MG  ONCE A WEEK  . venlafaxine (EFFEXOR) 75 MG tablet 37.5 mg 2 (two) times daily.   No facility-administered encounter medications on file as of 11/23/2018.     ALLERGIES: Allergies  Allergen Reactions  . Bee Venom Shortness Of Breath, Itching and Swelling    VACCINATION STATUS: Immunization History  Administered Date(s) Administered  . Influenza Split 05/07/2013    Diabetes  She presents for her follow-up diabetic visit. She has type 2 diabetes mellitus. Onset time: She was diagnosed at approximate age of 35 years. Her disease course has been improving. There are no hypoglycemic associated symptoms. Pertinent negatives for hypoglycemia include no confusion, headaches, pallor or seizures. Associated symptoms include blurred vision and fatigue. Pertinent negatives for diabetes include no chest pain, no polydipsia, no polyphagia and no polyuria. There are no hypoglycemic  complications. Symptoms are improving. There are no diabetic complications. Risk factors for coronary artery disease include dyslipidemia, diabetes mellitus, obesity, sedentary lifestyle and post-menopausal. Current diabetic treatment includes oral agent (monotherapy) (She could not afford Trulicity nor Bydureon.). Her weight is decreasing steadily. She is following a generally unhealthy diet. When asked about meal planning, she reported none. She has not had a previous visit with a dietitian. She never participates in exercise. Her overall blood glucose range is 130-140 mg/dl. An ACE inhibitor/angiotensin II receptor blocker is being taken. She does not see a podiatrist.Eye exam is current (She has not seen her ophthalmologist in 2 years, I urged to reschedule a visit.).  Hyperlipidemia  This is a chronic problem. The current episode started more than 1 year ago. The problem is controlled. Exacerbating diseases include diabetes and obesity. Pertinent negatives include no chest pain, myalgias or shortness of breath. Current antihyperlipidemic treatment includes statins. Risk factors for coronary artery disease include diabetes mellitus, dyslipidemia, obesity, a sedentary lifestyle and post-menopausal.     Objective:    LMP 03/19/2011   Wt Readings from Last 3 Encounters:  07/20/18 267 lb (121.1 kg)  07/20/18 267 lb (121.1 kg)  04/19/18 273 lb (123.8 kg)     Recent Results (from the past 2160 hour(s))  Hemoglobin A1c     Status: Abnormal   Collection Time: 11/16/18 10:49 AM  Result Value Ref Range   Hgb A1c MFr Bld 6.4 (H) <5.7 % of total Hgb    Comment: For someone without known diabetes, a hemoglobin  A1c value between 5.7% and 6.4% is consistent with prediabetes and should be confirmed with a  follow-up test. . For someone with known diabetes, a value <7% indicates that their diabetes is well controlled. A1c targets should be individualized based on duration of diabetes, age, comorbid  conditions, and other considerations. . This assay result is consistent with an increased risk of diabetes. . Currently, no consensus exists regarding use of hemoglobin A1c for diagnosis of diabetes for children. .    Mean Plasma Glucose 137 (calc)   eAG (mmol/L) 7.6 (calc)  COMPLETE METABOLIC PANEL WITH GFR     Status:  None   Collection Time: 11/16/18 10:49 AM  Result Value Ref Range   Glucose, Bld 134 65 - 139 mg/dL    Comment: .        Non-fasting reference interval .    BUN 13 7 - 25 mg/dL   Creat 0.75 0.50 - 0.99 mg/dL    Comment: For patients >26 years of age, the reference limit for Creatinine is approximately 13% higher for people identified as African-American. .    GFR, Est Non African American 87 > OR = 60 mL/min/1.2m2   GFR, Est African American 100 > OR = 60 mL/min/1.65m2   BUN/Creatinine Ratio NOT APPLICABLE 6 - 22 (calc)   Sodium 138 135 - 146 mmol/L   Potassium 4.1 3.5 - 5.3 mmol/L   Chloride 104 98 - 110 mmol/L   CO2 28 20 - 32 mmol/L   Calcium 9.2 8.6 - 10.4 mg/dL   Total Protein 7.0 6.1 - 8.1 g/dL   Albumin 3.9 3.6 - 5.1 g/dL   Globulin 3.1 1.9 - 3.7 g/dL (calc)   AG Ratio 1.3 1.0 - 2.5 (calc)   Total Bilirubin 0.5 0.2 - 1.2 mg/dL   Alkaline phosphatase (APISO) 76 37 - 153 U/L   AST 15 10 - 35 U/L   ALT 11 6 - 29 U/L    Lipid Panel     Component Value Date/Time   CHOL 133 07/13/2018 0859   TRIG 172 (H) 07/13/2018 0859   HDL 39 (L) 07/13/2018 0859   CHOLHDL 3.4 07/13/2018 0859   LDLCALC 69 07/13/2018 0859    Assessment & Plan:   1. Uncontrolled type 2 diabetes mellitus with hyperglycemia ([place])  - Tina Sampson has currently uncontrolled symptomatic type 2 DM since  58 years of age. -Her previsit labs show continued improvement in her a1c to 6.4% from 9.3%.   Recent labs reviewed.  -her diabetes is complicated by obesity/sedentary life and Tina Sampson remains at a high risk for more acute and chronic complications which include  CAD, CVA, CKD, retinopathy, and neuropathy. These are all discussed in detail with the patient.  - I have counseled her on diet management and weight loss, by adopting a carbohydrate restricted/protein rich diet.  - Patient admits there is a room for improvement in her diet and drink choices. -  Suggestion is made for her to avoid simple carbohydrates  from her diet including Cakes, Sweet Desserts / Pastries, Ice Cream, Soda (diet and regular), Sweet Tea, Candies, Chips, Cookies, Store Bought Juices, Alcohol in Excess of  1-2 drinks a day, Artificial Sweeteners, and "Sugar-free" Products. This will help patient to have stable blood glucose profile and potentially avoid unintended weight gain.   - I encouraged her to switch to  unprocessed or minimally processed complex starch and increased protein intake (animal or plant source), fruits, and vegetables.  - she is advised to stick to a routine mealtimes to eat 3 meals  a day and avoid unnecessary snacks ( to snack only to correct hypoglycemia).   - I have approached her with the following individualized plan to manage diabetes and patient agrees:   -Based on her presentation with controlled A1c of 6.4%, she will not need insulin treatment at this time.    - Her insurance now provides coverage for Trulicity with manageable co-pay. - she is advised to continue Trulicity  1.5mg  [address] weekly,  Along with  metformin 500 mg p.o. twice daily after breakfast and supper.   2)  BP/HTN: she is advised to home monitor blood pressure and report if > 140/90 on 2 separate readings.   She is advised to continue  lisinopril/HCTZ 10/12.5 mg p.o. Daily.  3) Lipids/HPL:   Her fasting lipid panel shows LDL at 66.  She is advised to continue pravastatin 40 mg p.o. nightly.    4) Chronic Care/Health Maintenance:  -she  is on  Statin medications and   lisinopril is encouraged to continue to follow up with Ophthalmology, Dentist,  Podiatrist at least yearly or  according to recommendations, and advised to  stay away from smoking. I have recommended yearly flu vaccine and pneumonia vaccination at least every 5 years; moderate intensity exercise for up to 150 minutes weekly; and  sleep for at least 7 hours a day.  - I advised patient to maintain close follow up with Deloria Lair., MD for primary care needs.  - Patient Care Time Today:  25 min, of which >50% was spent in reviewing her  current and  previous labs/studies, previous treatments, and medications doses and developing a plan for long-term care based on the latest recommendations for standards of care.  Tina Sampson participated in the discussions, expressed understanding, and voiced agreement with the above plans.  All questions were answered to her satisfaction. she is encouraged to contact clinic should she have any questions or concerns prior to her return visit.   Follow up plan: - Return in about 6 months (around 05/25/2019) for Follow up with Pre-visit Labs.  Tina Lloyd, MD [facility] [address] [place], [place] [address] Phone: [phone]  Fax: [phone]    11/23/2018, 12:09 PM  This note was partially dictated with voice recognition software. Similar sounding words can be transcribed inadequately or may not  be corrected upon review.

## 2018-12-17 ENCOUNTER — Other Ambulatory Visit: Payer: Self-pay | Admitting: "Endocrinology

## 2019-02-03 ENCOUNTER — Other Ambulatory Visit: Payer: Self-pay | Admitting: "Endocrinology

## 2019-03-09 ENCOUNTER — Other Ambulatory Visit: Payer: Self-pay | Admitting: "Endocrinology

## 2019-05-04 ENCOUNTER — Ambulatory Visit: Payer: Medicare HMO | Admitting: "Endocrinology

## 2019-05-17 ENCOUNTER — Other Ambulatory Visit: Payer: Self-pay | Admitting: "Endocrinology

## 2019-05-25 ENCOUNTER — Ambulatory Visit: Payer: Medicare HMO | Admitting: "Endocrinology

## 2019-06-13 ENCOUNTER — Other Ambulatory Visit: Payer: Self-pay | Admitting: "Endocrinology

## 2019-06-13 DIAGNOSIS — E1165 Type 2 diabetes mellitus with hyperglycemia: Secondary | ICD-10-CM

## 2019-06-14 LAB — COMPREHENSIVE METABOLIC PANEL
AG Ratio: 1.4 (calc) (ref 1.0–2.5)
ALT: 14 U/L (ref 6–29)
AST: 17 U/L (ref 10–35)
Albumin: 4.2 g/dL (ref 3.6–5.1)
Alkaline phosphatase (APISO): 80 U/L (ref 37–153)
BUN: 18 mg/dL (ref 7–25)
CO2: 24 mmol/L (ref 20–32)
Calcium: 9.8 mg/dL (ref 8.6–10.4)
Chloride: 103 mmol/L (ref 98–110)
Creat: 0.89 mg/dL (ref 0.50–0.99)
Globulin: 3 g/dL (calc) (ref 1.9–3.7)
Glucose, Bld: 112 mg/dL (ref 65–139)
Potassium: 4.4 mmol/L (ref 3.5–5.3)
Sodium: 140 mmol/L (ref 135–146)
Total Bilirubin: 0.5 mg/dL (ref 0.2–1.2)
Total Protein: 7.2 g/dL (ref 6.1–8.1)

## 2019-06-14 LAB — HEMOGLOBIN A1C
Hgb A1c MFr Bld: 7.8 % of total Hgb — ABNORMAL HIGH (ref <5.7)
Mean Plasma Glucose: 177 (calc)
eAG (mmol/L): 9.8 (calc)

## 2019-06-20 ENCOUNTER — Ambulatory Visit (INDEPENDENT_AMBULATORY_CARE_PROVIDER_SITE_OTHER): Payer: Medicare HMO | Admitting: "Endocrinology

## 2019-06-20 ENCOUNTER — Other Ambulatory Visit: Payer: Self-pay

## 2019-06-20 ENCOUNTER — Encounter: Payer: Self-pay | Admitting: "Endocrinology

## 2019-06-20 DIAGNOSIS — I10 Essential (primary) hypertension: Secondary | ICD-10-CM

## 2019-06-20 DIAGNOSIS — E1165 Type 2 diabetes mellitus with hyperglycemia: Secondary | ICD-10-CM

## 2019-06-20 DIAGNOSIS — E782 Mixed hyperlipidemia: Secondary | ICD-10-CM | POA: Diagnosis not present

## 2019-06-20 NOTE — Patient Instructions (Signed)
She will stop by for Bydureon samples .

## 2019-06-20 NOTE — Progress Notes (Signed)
06/20/2019, 12:41 PM                                                    Endocrinology Telehealth Visit Follow up Note -During COVID -19 Pandemic  This visit type was conducted due to national recommendations for restrictions regarding the COVID-19 Pandemic  in an effort to limit this patient's exposure and mitigate transmission of the corona virus.  Due to her co-morbid illnesses, Tina Sampson is at  moderate to high risk for complications without adequate follow up.  This format is felt to be most appropriate for her at this time.  I connected with this patient on 06/20/2019   by telephone and verified that I am speaking with the correct person using two identifiers. Tina Sampson, [birth year]. she has verbally consented to this visit. All issues noted in this document were discussed and addressed. The format was not optimal for physical exam.    Subjective:    Patient ID: Tina Sampson, female    DOB: [birth year].  Tina Sampson is being engaged in Telehealth  follow-up  for management of currently uncontrolled symptomatic type 2 diabetes, hyperlipidemia, hypertension. PMD:  Deloria Lair., MD.   Past Medical History:  Diagnosis Date  . Abdominal pain 10/15/2017  . Diabetes mellitus   . Diabetes mellitus, type II ([place])   . H/O knee surgery   . Hyperlipidemia    Past Surgical History:  Procedure Laterality Date  . ANKLE SURGERY    . CARDIAC CATHETERIZATION    . catherization    . CERVICAL DISC SURGERY    . CESAREAN SECTION    . CHOLECYSTECTOMY    . KNEE SURGERY    . KNEE SURGERY    . TUBAL LIGATION     Social History   Socioeconomic History  . Marital status: Married    Spouse name: Not on file  . Number of children: Not on file  . Years of education: Not on file  . Highest education level: Not on file  Occupational History  . Not on file  Social Needs  . Financial resource  strain: Not on file  . Food insecurity    Worry: Not on file    Inability: Not on file  . Transportation needs    Medical: Not on file    Non-medical: Not on file  Tobacco Use  . Smoking status: Never Smoker  . Smokeless tobacco: Never Used  Substance and Sexual Activity  . Alcohol use: No  . Drug use: No  . Sexual activity: Yes  Lifestyle  . Physical activity    Days per week: Not on file    Minutes per session: Not on file  . Stress: Not on file  Relationships  . Social Herbalist on phone: Not on file    Gets together: Not on file    Attends religious service: Not on file    Active member of club or organization:  Not on file    Attends meetings of clubs or organizations: Not on file    Relationship status: Not on file  Other Topics Concern  . Not on file  Social History Narrative  . Not on file   Outpatient Encounter Medications as of 06/20/2019  Medication Sig  . Exenatide ER 2 MG/0.85ML AUIJ Inject 2 mg into the skin once a week. samples  . aspirin EC 81 MG tablet Take 81 mg daily by mouth.  . B Complex Vitamins (VITAMIN B COMPLEX PO) Take daily by mouth.  . gabapentin (NEURONTIN) 300 MG capsule Take 900 mg by mouth as directed.   Marland Kitchen lisinopril-hydrochlorothiazide (ZESTORETIC) 10-12.5 MG tablet TAKE 1 TABLET EVERY DAY  . metFORMIN (GLUCOPHAGE) 500 MG tablet Take 2 tablets by mouth 2 (two) times daily with a meal. 1000 mg in the a.m & 1000 mg in the p.m.   . Multiple Vitamin (MULTIVITAMIN) tablet Take 1 tablet daily by mouth.  Marland Kitchen omeprazole (PRILOSEC) 20 MG capsule Take 20 mg daily by mouth.  . pramipexole (MIRAPEX) 0.125 MG tablet Take 0.125 mg by mouth at bedtime. For RLS  . pravastatin (PRAVACHOL) 40 MG tablet Take 40 mg by mouth daily.  . traZODone (DESYREL) 50 MG tablet Take 50 mg by mouth at bedtime.  Marland Kitchen venlafaxine (EFFEXOR) 75 MG tablet 37.5 mg 2 (two) times daily.  . [DISCONTINUED] TRULICITY 1.5 0000000 SOPN INJECT 1.5MG  ONCE A WEEK   No  facility-administered encounter medications on file as of 06/20/2019.     ALLERGIES: Allergies  Allergen Reactions  . Bee Venom Shortness Of Breath, Itching and Swelling    VACCINATION STATUS: Immunization History  Administered Date(s) Administered  . Influenza Split 05/07/2013    Diabetes She presents for her follow-up diabetic visit. She has type 2 diabetes mellitus. Onset time: She was diagnosed at approximate age of 80 years. Her disease course has been worsening. There are no hypoglycemic associated symptoms. Pertinent negatives for hypoglycemia include no confusion, headaches, pallor or seizures. Associated symptoms include fatigue. Pertinent negatives for diabetes include no blurred vision, no chest pain, no polydipsia, no polyphagia and no polyuria. There are no hypoglycemic complications. Symptoms are worsening. There are no diabetic complications. Risk factors for coronary artery disease include dyslipidemia, diabetes mellitus, obesity, sedentary lifestyle and post-menopausal. Current diabetic treatment includes oral agent (monotherapy). Her weight is decreasing steadily. She is following a generally unhealthy diet. When asked about meal planning, she reported none. She has not had a previous visit with a dietitian. She never participates in exercise. Her overall blood glucose range is 140-180 mg/dl. An ACE inhibitor/angiotensin II receptor blocker is being taken. She does not see a podiatrist.Eye exam is current (She has not seen her ophthalmologist in 2 years, I urged to reschedule a visit.).  Hyperlipidemia This is a chronic problem. The current episode started more than 1 year ago. The problem is controlled. Exacerbating diseases include diabetes and obesity. Pertinent negatives include no chest pain, myalgias or shortness of breath. Current antihyperlipidemic treatment includes statins. Risk factors for coronary artery disease include diabetes mellitus, dyslipidemia, obesity, a  sedentary lifestyle and post-menopausal.    Review of systems: Limited as above.  Objective:    LMP 03/19/2011   Wt Readings from Last 3 Encounters:  07/20/18 267 lb (121.1 kg)  07/20/18 267 lb (121.1 kg)  04/19/18 273 lb (123.8 kg)     Recent Results (from the past 2160 hour(s))  Hemoglobin A1c     Status: Abnormal  Collection Time: 06/13/19  1:22 PM  Result Value Ref Range   Hgb A1c MFr Bld 7.8 (H) <5.7 % of total Hgb    Comment: For someone without known diabetes, a hemoglobin A1c value of 6.5% or greater indicates that they may have  diabetes and this should be confirmed with a follow-up  test. . For someone with known diabetes, a value <7% indicates  that their diabetes is well controlled and a value  greater than or equal to 7% indicates suboptimal  control. A1c targets should be individualized based on  duration of diabetes, age, comorbid conditions, and  other considerations. . Currently, no consensus exists regarding use of hemoglobin A1c for diagnosis of diabetes for children. .    Mean Plasma Glucose 177 (calc)   eAG (mmol/L) 9.8 (calc)  Comprehensive metabolic panel     Status: None   Collection Time: 06/13/19  1:22 PM  Result Value Ref Range   Glucose, Bld 112 65 - 139 mg/dL    Comment: .        Non-fasting reference interval .    BUN 18 7 - 25 mg/dL   Creat 0.89 0.50 - 0.99 mg/dL    Comment: For patients >23 years of age, the reference limit for Creatinine is approximately 13% higher for people identified as African-American. .    BUN/Creatinine Ratio NOT APPLICABLE 6 - 22 (calc)   Sodium 140 135 - 146 mmol/L   Potassium 4.4 3.5 - 5.3 mmol/L   Chloride 103 98 - 110 mmol/L   CO2 24 20 - 32 mmol/L   Calcium 9.8 8.6 - 10.4 mg/dL   Total Protein 7.2 6.1 - 8.1 g/dL   Albumin 4.2 3.6 - 5.1 g/dL   Globulin 3.0 1.9 - 3.7 g/dL (calc)   AG Ratio 1.4 1.0 - 2.5 (calc)   Total Bilirubin 0.5 0.2 - 1.2 mg/dL   Alkaline phosphatase (APISO) 80 37 - 153 U/L    AST 17 10 - 35 U/L   ALT 14 6 - 29 U/L    Lipid Panel     Component Value Date/Time   CHOL 133 07/13/2018 0859   TRIG 172 (H) 07/13/2018 0859   HDL 39 (L) 07/13/2018 0859   CHOLHDL 3.4 07/13/2018 0859   LDLCALC 69 07/13/2018 0859    Assessment & Plan:   1. Uncontrolled type 2 diabetes mellitus with hyperglycemia ([place])  - Talynn L Lagow has currently uncontrolled symptomatic type 2 DM since  58 years of age. -Her previsit labs show increasing A1c of 7.8%, increasing from 6.4% during her last visit.  The co-pay for her Trulicity has significantly increased, she did not afford it.   she has not taken it  for more than a month.     Recent labs reviewed.  -her diabetes is complicated by obesity/sedentary life and ODELIA SIGUR remains at a high risk for more acute and chronic complications which include CAD, CVA, CKD, retinopathy, and neuropathy. These are all discussed in detail with the patient.  - I have counseled her on diet management and weight loss, by adopting a carbohydrate restricted/protein rich diet.  - she  admits there is a room for improvement in her diet and drink choices. -  Suggestion is made for her to avoid simple carbohydrates  from her diet including Cakes, Sweet Desserts / Pastries, Ice Cream, Soda (diet and regular), Sweet Tea, Candies, Chips, Cookies, Sweet Pastries,  Store Bought Juices, Alcohol in Excess of  1-2 drinks a  day, Artificial Sweeteners, Coffee Creamer, and "Sugar-free" Products. This will help patient to have stable blood glucose profile and potentially avoid unintended weight gain.  - I encouraged her to switch to  unprocessed or minimally processed complex starch and increased protein intake (animal or plant source), fruits, and vegetables.  - she is advised to stick to a routine mealtimes to eat 3 meals  a day and avoid unnecessary snacks ( to snack only to correct hypoglycemia).   - I have approached her with the following individualized  plan to manage diabetes and patient agrees:   -Based on her presentation with increased A1c was 7.8%, she will need additional treatment besides Metformin.   -She did not continue on her Trulicity due to increased co-pay.  She is offered samples of Bydureon from clinic, 2 mg subcutaneously weekly.  She is advised to increase Metformin to 1000 mg p.o. twice daily.  2) BP/HTN: she is advised to home monitor blood pressure and report if > 140/90 on 2 separate readings.    She is advised to continue  lisinopril/HCTZ 10/12.5 mg p.o. Daily.  3) Lipids/HPL:   Her fasting lipid panel shows LDL at 66.  She is advised to continue pravastatin 40 mg p.o. nightly.      4) Chronic Care/Health Maintenance:  -she  is on  Statin medications and   lisinopril is encouraged to continue to follow up with Ophthalmology, Dentist,  Podiatrist at least yearly or according to recommendations, and advised to  stay away from smoking. I have recommended yearly flu vaccine and pneumonia vaccination at least every 5 years; moderate intensity exercise for up to 150 minutes weekly; and  sleep for at least 7 hours a day.  - I advised patient to maintain close follow up with Deloria Lair., MD for primary care needs. - Patient Care Time Today:  25 min, of which >50% was spent in  counseling and the rest reviewing her  current and  previous labs/studies, previous treatments, her blood glucose readings, and medications' doses and developing a plan for long-term care based on the latest recommendations for standards of care.   Tina Sampson participated in the discussions, expressed understanding, and voiced agreement with the above plans.  All questions were answered to her satisfaction. she is encouraged to contact clinic should she have any questions or concerns prior to her return visit.   Follow up plan: - Return in about 4 months (around 10/18/2019) for Next Visit A1c in Office.  Glade Lloyd, MD [facility]  Group [facility] [address] [place], [place] [address] Phone: [phone]  Fax: [phone]    06/20/2019, 12:41 PM  This note was partially dictated with voice recognition software. Similar sounding words can be transcribed inadequately or may not  be corrected upon review.

## 2019-08-18 ENCOUNTER — Other Ambulatory Visit: Payer: Self-pay | Admitting: "Endocrinology

## 2019-08-18 ENCOUNTER — Other Ambulatory Visit: Payer: Self-pay

## 2019-08-18 ENCOUNTER — Telehealth: Payer: Self-pay | Admitting: "Endocrinology

## 2019-08-18 MED ORDER — EXENATIDE ER 2 MG/0.85ML ~~LOC~~ AUIJ: 2.0000 mg | AUTO-INJECTOR | SUBCUTANEOUS | 2 refills | Status: DC

## 2019-08-18 MED ORDER — EXENATIDE ER 2 MG/0.85ML ~~LOC~~ AUIJ: 2.0000 mg | AUTO-INJECTOR | SUBCUTANEOUS | 0 refills | Status: DC

## 2019-08-18 NOTE — Telephone Encounter (Signed)
Patient stopped by and got another sample of bydyreon 2mg . She would like a RX sent [place]/Pharmacy/Homecare

## 2019-08-18 NOTE — Telephone Encounter (Signed)
done

## 2019-08-24 MED ORDER — EXENATIDE ER 2 MG/0.85ML ~~LOC~~ AUIJ: 2.0000 mg | AUTO-INJECTOR | SUBCUTANEOUS | 2 refills | Status: DC

## 2019-08-24 NOTE — Telephone Encounter (Signed)
Can you re send the RX

## 2019-08-24 NOTE — Telephone Encounter (Signed)
Rx sent 

## 2019-10-05 ENCOUNTER — Other Ambulatory Visit: Payer: Self-pay | Admitting: "Endocrinology

## 2019-10-19 ENCOUNTER — Other Ambulatory Visit: Payer: Self-pay

## 2019-10-19 ENCOUNTER — Encounter: Payer: Self-pay | Admitting: "Endocrinology

## 2019-10-19 ENCOUNTER — Ambulatory Visit (INDEPENDENT_AMBULATORY_CARE_PROVIDER_SITE_OTHER): Payer: Medicare HMO | Admitting: "Endocrinology

## 2019-10-19 VITALS — BP 127/82 | HR 75 | Ht 67.5 in | Wt 279.8 lb

## 2019-10-19 DIAGNOSIS — I10 Essential (primary) hypertension: Secondary | ICD-10-CM | POA: Diagnosis not present

## 2019-10-19 DIAGNOSIS — E1165 Type 2 diabetes mellitus with hyperglycemia: Secondary | ICD-10-CM

## 2019-10-19 DIAGNOSIS — E782 Mixed hyperlipidemia: Secondary | ICD-10-CM | POA: Diagnosis not present

## 2019-10-19 LAB — POCT GLYCOSYLATED HEMOGLOBIN (HGB A1C): Hemoglobin A1C: 7.2 % — AB (ref 4.0–5.6)

## 2019-10-19 NOTE — Patient Instructions (Signed)

## 2019-10-19 NOTE — Progress Notes (Signed)
10/19/2019, 6:46 PM                Endocrinology follow-up note  Subjective:    Patient ID: Tina Sampson, female    DOB: 1960-09-24.  Tina Sampson is being seen in follow-up in the management of currently uncontrolled symptomatic type 2 diabetes, hyperlipidemia, hypertension. PMD:  Deloria Lair., MD.   Past Medical History:  Diagnosis Date  . Abdominal pain 10/15/2017  . Diabetes mellitus   . Diabetes mellitus, type II ([place])   . H/O knee surgery   . Hyperlipidemia    Past Surgical History:  Procedure Laterality Date  . ANKLE SURGERY    . CARDIAC CATHETERIZATION    . catherization    . CERVICAL DISC SURGERY    . CESAREAN SECTION    . CHOLECYSTECTOMY    . KNEE SURGERY    . KNEE SURGERY    . TUBAL LIGATION     Social History   Socioeconomic History  . Marital status: Married    Spouse name: Not on file  . Number of children: Not on file  . Years of education: Not on file  . Highest education level: Not on file  Occupational History  . Not on file  Tobacco Use  . Smoking status: Never Smoker  . Smokeless tobacco: Never Used  Substance and Sexual Activity  . Alcohol use: No  . Drug use: No  . Sexual activity: Yes  Other Topics Concern  . Not on file  Social History Narrative  . Not on file   Social Determinants of Health   Financial Resource Strain:   . Difficulty of Paying Living Expenses:   Food Insecurity:   . Worried About Charity fundraiser in the Last Year:   . Arboriculturist in the Last Year:   Transportation Needs:   . Film/video editor (Medical):   Marland Kitchen Lack of Transportation (Non-Medical):   Physical Activity:   . Days of Exercise per Week:   . Minutes of Exercise per Session:   Stress:   . Feeling of Stress :   Social Connections:   . Frequency of Communication with Friends and Family:   . Frequency of Social Gatherings with Friends and Family:    . Attends Religious Services:   . Active Member of Clubs or Organizations:   . Attends Archivist Meetings:   Marland Kitchen Marital Status:    Outpatient Encounter Medications as of 10/19/2019  Medication Sig  . diphenhydramine-acetaminophen (TYLENOL PM) 25-500 MG TABS tablet Take 1 tablet by mouth at bedtime as needed.  . venlafaxine XR (EFFEXOR-XR) 150 MG 24 hr capsule Take by mouth.  Marland Kitchen aspirin EC 81 MG tablet Take 81 mg daily by mouth.  . B Complex Vitamins (VITAMIN B COMPLEX PO) Take daily by mouth.  . Exenatide ER 2 MG/0.85ML AUIJ Inject 2 mg into the skin once a week.  . gabapentin (NEURONTIN) 300 MG capsule Take 900 mg by mouth as directed.   Marland Kitchen lisinopril-hydrochlorothiazide (ZESTORETIC) 10-12.5 MG tablet TAKE 1 TABLET EVERY DAY  . metFORMIN (GLUCOPHAGE) 500 MG tablet Take 2  tablets by mouth 2 (two) times daily with a meal. 1000 mg in the a.m & 1000 mg in the p.m.   . Multiple Vitamin (MULTIVITAMIN) tablet Take 1 tablet daily by mouth.  Marland Kitchen omeprazole (PRILOSEC) 20 MG capsule Take 20 mg by mouth 2 (two) times daily before a meal.   . pramipexole (MIRAPEX) 0.125 MG tablet Take 0.125 mg by mouth at bedtime. For RLS  . pravastatin (PRAVACHOL) 40 MG tablet Take 40 mg by mouth daily.  . traZODone (DESYREL) 50 MG tablet Take 50 mg by mouth at bedtime.  . [DISCONTINUED] venlafaxine (EFFEXOR) 75 MG tablet 150 mg daily.    No facility-administered encounter medications on file as of 10/19/2019.    ALLERGIES: Allergies  Allergen Reactions  . Bee Venom Shortness Of Breath, Itching and Swelling    VACCINATION STATUS: Immunization History  Administered Date(s) Administered  . Influenza [place] 05/07/2013    Diabetes She presents for her follow-up diabetic visit. She has type 2 diabetes mellitus. Onset time: She was diagnosed at approximate age of 50 years. Her disease course has been improving. There are no hypoglycemic associated symptoms. Pertinent negatives for hypoglycemia include no  confusion, headaches, pallor or seizures. Pertinent negatives for diabetes include no blurred vision, no chest pain, no polydipsia, no polyphagia and no polyuria. There are no hypoglycemic complications. Symptoms are improving. There are no diabetic complications. Risk factors for coronary artery disease include dyslipidemia, diabetes mellitus, obesity, sedentary lifestyle and post-menopausal. Current diabetic treatment includes oral agent (monotherapy). Her weight is increasing steadily. She is following a generally unhealthy diet. When asked about meal planning, she reported none. She has not had a previous visit with a dietitian. She never participates in exercise. Her overall blood glucose range is 140-180 mg/dl. An ACE inhibitor/angiotensin II receptor blocker is being taken. She does not see a podiatrist.Eye exam is current (She has not seen her ophthalmologist in 2 years, I urged to reschedule a visit.).  Hyperlipidemia This is a chronic problem. The current episode started more than 1 year ago. The problem is controlled. Exacerbating diseases include diabetes and obesity. Pertinent negatives include no chest pain, myalgias or shortness of breath. Current antihyperlipidemic treatment includes statins. Risk factors for coronary artery disease include diabetes mellitus, dyslipidemia, obesity, a sedentary lifestyle and post-menopausal.     Review of systems  Constitutional: + Progressive weight gain,  current  Body mass index is 43.18 kg/m. , no fatigue, no subjective hyperthermia, no subjective hypothermia Eyes: no blurry vision, no xerophthalmia ENT: no sore throat, no nodules palpated in throat, no dysphagia/odynophagia, no hoarseness Cardiovascular: no Chest Pain, no Shortness of Breath, no palpitations, no leg swelling Respiratory: no cough, no shortness of breath Gastrointestinal: no Nausea/Vomiting/Diarhhea Musculoskeletal: no muscle/joint aches Skin: no rashes, no hyperemia Neurological:  no tremors, no numbness, no tingling, no dizziness Psychiatric: no depression, no anxiety   Objective:    BP 127/82   Pulse 75   Ht 5' 7.5" (1.715 m)   Wt 279 lb 12.8 oz (126.9 kg)   LMP 03/19/2011   BMI 43.18 kg/m   Wt Readings from Last 3 Encounters:  10/19/19 279 lb 12.8 oz (126.9 kg)  07/20/18 267 lb (121.1 kg)  07/20/18 267 lb (121.1 kg)    Physical Exam- Limited  Constitutional:  Body mass index is 43.18 kg/m. , not in acute distress, normal state of mind Eyes:  EOMI, no exophthalmos Neck: Supple Thyroid: No gross goiter Respiratory: Adequate breathing efforts Musculoskeletal: no gross deformities, strength intact  in all four extremities, no gross restriction of joint movements Skin:  no rashes, no hyperemia Neurological: no tremor with outstretched hands,    Recent Results (from the past 2160 hour(s))  HgB A1c     Status: Abnormal   Collection Time: 10/19/19 10:28 AM  Result Value Ref Range   Hemoglobin A1C 7.2 (A) 4.0 - 5.6 %   HbA1c POC (<> result, manual entry)     HbA1c, POC (prediabetic range)     HbA1c, POC (controlled diabetic range)      Lipid Panel     Component Value Date/Time   CHOL 133 07/13/2018 0859   TRIG 172 (H) 07/13/2018 0859   HDL 39 (L) 07/13/2018 0859   CHOLHDL 3.4 07/13/2018 0859   LDLCALC 69 07/13/2018 0859    Assessment & Plan:   1. Uncontrolled type 2 diabetes mellitus with hyperglycemia ([place])  - Deloria L Eggleston has currently uncontrolled symptomatic type 2 DM since  59 years of age. -Her point-of-care A1c 7.2%, improving from 7.8%.  Denies hypoglycemia.   Recent labs reviewed.  -her diabetes is complicated by obesity/sedentary life and EUDA CREQUE remains at a high risk for more acute and chronic complications which include CAD, CVA, CKD, retinopathy, and neuropathy. These are all discussed in detail with the patient.  - I have counseled her on diet management and weight loss, by adopting a carbohydrate  restricted/protein rich diet.  - she  admits there is a room for improvement in her diet and drink choices. -  Suggestion is made for her to avoid simple carbohydrates  from her diet including Cakes, Sweet Desserts / Pastries, Ice Cream, Soda (diet and regular), Sweet Tea, Candies, Chips, Cookies, Sweet Pastries,  Store Bought Juices, Alcohol in Excess of  1-2 drinks a day, Artificial Sweeteners, Coffee Creamer, and "Sugar-free" Products. This will help patient to have stable blood glucose profile and potentially avoid unintended weight gain.  - I encouraged her to switch to  unprocessed or minimally processed complex starch and increased protein intake (animal or plant source), fruits, and vegetables.  - she is advised to stick to a routine mealtimes to eat 3 meals  a day and avoid unnecessary snacks ( to snack only to correct hypoglycemia).   - I have approached her with the following individualized plan to manage diabetes and patient agrees:   -Based on her presentation with improved A1c was 7.2%, she would not need any insulin intervention at this time.   -Her insurance is covering [place].  She is advised to continue Bydureon  2 mg subcutaneously weekly.   She is advised to continue Metformin 1000 mg p.o. twice daily.    2) BP/HTN Her blood pressure is controlled to target.   She is advised to continue  lisinopril/HCTZ 10/12.5 mg p.o. Daily.  3) Lipids/HPL:   Her fasting lipid panel shows LDL at 66.  She is advised to continue pravastatin 40 mg p.o. nightly .      4) Chronic Care/Health Maintenance:  -she  is on  Statin medications and   lisinopril is encouraged to continue to follow up with Ophthalmology, Dentist,  Podiatrist at least yearly or according to recommendations, and advised to  stay away from smoking. I have recommended yearly flu vaccine and pneumonia vaccination at least every 5 years; moderate intensity exercise for up to 150 minutes weekly; and  sleep for at least 7  hours a day.  - I advised patient to maintain close follow up with  Deloria Lair., MD for primary care needs.  - Time spent on this patient care encounter:  35 min, of which > 50% was spent in  counseling and the rest reviewing her blood glucose logs , discussing her hypoglycemia and hyperglycemia episodes, reviewing her current and  previous labs / studies  ( including abstraction from other facilities) and medications  doses and developing a  long term treatment plan and documenting her care.   Please refer to Patient Instructions for Blood Glucose Monitoring and Insulin/Medications Dosing Guide"  in media tab for additional information. Please  also refer to " Patient Self Inventory" in the Media  tab for reviewed elements of pertinent patient history.  [name] participated in the discussions, expressed understanding, and voiced agreement with the above plans.  All questions were answered to her satisfaction. she is encouraged to contact clinic should she have any questions or concerns prior to her return visit.    Follow up plan: - Return in about 3 months (around 01/19/2020) for Bring Meter and Logs- A1c in Office, Follow up with Pre-visit Labs.  Glade Lloyd, MD [facility], [facility] [address] [place], [place] [address] Phone: [phone]  Fax: [phone]    10/19/2019, 6:46 PM  This note was partially dictated with voice recognition software. Similar sounding words can be transcribed inadequately or may not  be corrected upon review.

## 2019-11-24 ENCOUNTER — Other Ambulatory Visit: Payer: Self-pay | Admitting: "Endocrinology

## 2019-11-25 ENCOUNTER — Other Ambulatory Visit: Payer: Self-pay | Admitting: "Endocrinology

## 2020-01-03 ENCOUNTER — Other Ambulatory Visit: Payer: Self-pay | Admitting: "Endocrinology

## 2020-01-14 LAB — COMPLETE METABOLIC PANEL WITH GFR
AG Ratio: 1.7 (calc) (ref 1.0–2.5)
ALT: 19 U/L (ref 6–29)
AST: 23 U/L (ref 10–35)
Albumin: 4 g/dL (ref 3.6–5.1)
Alkaline phosphatase (APISO): 79 U/L (ref 37–153)
BUN: 11 mg/dL (ref 7–25)
CO2: 28 mmol/L (ref 20–32)
Calcium: 9.5 mg/dL (ref 8.6–10.4)
Chloride: 107 mmol/L (ref 98–110)
Creat: 0.84 mg/dL (ref 0.50–0.99)
GFR, Est African American: 87 mL/min/{1.73_m2} (ref >=60)
GFR, Est Non African American: 75 mL/min/{1.73_m2} (ref >=60)
Globulin: 2.4 g/dL (calc) (ref 1.9–3.7)
Glucose, Bld: 154 mg/dL — ABNORMAL HIGH (ref 65–99)
Potassium: 4.2 mmol/L (ref 3.5–5.3)
Sodium: 142 mmol/L (ref 135–146)
Total Bilirubin: 0.4 mg/dL (ref 0.2–1.2)
Total Protein: 6.4 g/dL (ref 6.1–8.1)

## 2020-01-14 LAB — MICROALBUMIN / CREATININE URINE RATIO
Creatinine, Urine: 160 mg/dL (ref 20–275)
Microalb Creat Ratio: 3 mcg/mg creat (ref <30)
Microalb, Ur: 0.4 mg/dL

## 2020-01-14 LAB — TSH: TSH: 1.42 mIU/L (ref 0.40–4.50)

## 2020-01-14 LAB — LIPID PANEL
Cholesterol: 140 mg/dL (ref <200)
HDL: 38 mg/dL — ABNORMAL LOW (ref >=50)
LDL Cholesterol (Calc): 71 mg/dL (calc)
Non-HDL Cholesterol (Calc): 102 mg/dL (calc) (ref <130)
Total CHOL/HDL Ratio: 3.7 (calc) (ref <5.0)
Triglycerides: 223 mg/dL — ABNORMAL HIGH (ref <150)

## 2020-01-14 LAB — T4, FREE: Free T4: 1 ng/dL (ref 0.8–1.8)

## 2020-01-20 ENCOUNTER — Other Ambulatory Visit: Payer: Self-pay

## 2020-01-20 ENCOUNTER — Encounter: Payer: Self-pay | Admitting: "Endocrinology

## 2020-01-20 ENCOUNTER — Ambulatory Visit (INDEPENDENT_AMBULATORY_CARE_PROVIDER_SITE_OTHER): Payer: Medicare HMO | Admitting: "Endocrinology

## 2020-01-20 VITALS — BP 111/76 | HR 79 | Ht 67.5 in | Wt 275.4 lb

## 2020-01-20 DIAGNOSIS — E1165 Type 2 diabetes mellitus with hyperglycemia: Secondary | ICD-10-CM

## 2020-01-20 DIAGNOSIS — I10 Essential (primary) hypertension: Secondary | ICD-10-CM

## 2020-01-20 DIAGNOSIS — E782 Mixed hyperlipidemia: Secondary | ICD-10-CM

## 2020-01-20 LAB — POCT GLYCOSYLATED HEMOGLOBIN (HGB A1C): Hemoglobin A1C: 7.1 % — AB (ref 4.0–5.6)

## 2020-01-20 NOTE — Progress Notes (Signed)
01/20/2020, 2:21 PM                Endocrinology follow-up note  Subjective:    Patient ID: Tina Sampson, female    DOB: [birth year].  Tina Sampson is being seen in follow-up in the management of currently uncontrolled symptomatic type 2 diabetes, hyperlipidemia, hypertension. PMD:  Deloria Lair., MD.   Past Medical History:  Diagnosis Date  . Abdominal pain 10/15/2017  . Diabetes mellitus   . Diabetes mellitus, type II ([place])   . H/O knee surgery   . Hyperlipidemia    Past Surgical History:  Procedure Laterality Date  . ANKLE SURGERY    . CARDIAC CATHETERIZATION    . catherization    . CERVICAL DISC SURGERY    . CESAREAN SECTION    . CHOLECYSTECTOMY    . KNEE SURGERY    . KNEE SURGERY    . TUBAL LIGATION     Social History   Socioeconomic History  . Marital status: Married    Spouse name: Not on file  . Number of children: Not on file  . Years of education: Not on file  . Highest education level: Not on file  Occupational History  . Not on file  Tobacco Use  . Smoking status: Never Smoker  . Smokeless tobacco: Never Used  Vaping Use  . Vaping Use: Never used  Substance and Sexual Activity  . Alcohol use: No  . Drug use: No  . Sexual activity: Yes  Other Topics Concern  . Not on file  Social History Narrative  . Not on file   Social Determinants of Health   Financial Resource Strain:   . Difficulty of Paying Living Expenses:   Food Insecurity:   . Worried About Charity fundraiser in the Last Year:   . Arboriculturist in the Last Year:   Transportation Needs:   . Film/video editor (Medical):   Marland Kitchen Lack of Transportation (Non-Medical):   Physical Activity:   . Days of Exercise per Week:   . Minutes of Exercise per Session:   Stress:   . Feeling of Stress :   Social Connections:   . Frequency of Communication with Friends and Family:   . Frequency of  Social Gatherings with Friends and Family:   . Attends Religious Services:   . Active Member of Clubs or Organizations:   . Attends Archivist Meetings:   Marland Kitchen Marital Status:    Outpatient Encounter Medications as of 01/20/2020  Medication Sig  . aspirin EC 81 MG tablet Take 81 mg daily by mouth.  . B Complex Vitamins (VITAMIN B COMPLEX PO) Take daily by mouth.  Marland Kitchen BYDUREON BCISE 2 MG/0.85ML AUIJ INJECT 2 MG INTO SKIN ONCE A WEEK  . diphenhydramine-acetaminophen (TYLENOL PM) 25-500 MG TABS tablet Take 1 tablet by mouth at bedtime as needed.  . gabapentin (NEURONTIN) 300 MG capsule Take 900 mg by mouth as directed.   Marland Kitchen lisinopril-hydrochlorothiazide (ZESTORETIC) 10-12.5 MG tablet TAKE 1 TABLET EVERY DAY  . metFORMIN (GLUCOPHAGE) 500 MG tablet Take 2 tablets by mouth 2 (two)  times daily with a meal. 1000 mg in the a.m & 1000 mg in the p.m.   . Multiple Vitamin (MULTIVITAMIN) tablet Take 1 tablet daily by mouth.  Marland Kitchen omeprazole (PRILOSEC) 20 MG capsule Take 20 mg by mouth daily.   . pramipexole (MIRAPEX) 0.125 MG tablet Take 0.125 mg by mouth at bedtime. For RLS  . pravastatin (PRAVACHOL) 40 MG tablet Take 40 mg by mouth daily.  . traZODone (DESYREL) 50 MG tablet Take 50 mg by mouth at bedtime.  Marland Kitchen venlafaxine XR (EFFEXOR-XR) 150 MG 24 hr capsule Take by mouth.   No facility-administered encounter medications on file as of 01/20/2020.    ALLERGIES: Allergies  Allergen Reactions  . Bee Venom Shortness Of Breath, Itching and Swelling    VACCINATION STATUS: Immunization History  Administered Date(s) Administered  . Influenza Split 05/07/2013  . Moderna SARS-COVID-2 Vaccination 10/05/2019, 11/02/2019    Diabetes She presents for her follow-up diabetic visit. She has type 2 diabetes mellitus. Onset time: She was diagnosed at approximate age of 69 years. Her disease course has been stable. There are no hypoglycemic associated symptoms. Pertinent negatives for hypoglycemia include no  confusion, headaches, pallor or seizures. Pertinent negatives for diabetes include no blurred vision, no chest pain, no polydipsia, no polyphagia and no polyuria. There are no hypoglycemic complications. Symptoms are stable. There are no diabetic complications. Risk factors for coronary artery disease include dyslipidemia, diabetes mellitus, obesity, sedentary lifestyle and post-menopausal. Current diabetic treatment includes oral agent (monotherapy). Her weight is decreasing steadily. She is following a generally unhealthy diet. When asked about meal planning, she reported none. She has not had a previous visit with a dietitian. She never participates in exercise. Her breakfast blood glucose range is generally 130-140 mg/dl. Her bedtime blood glucose range is generally 140-180 mg/dl. Her overall blood glucose range is 140-180 mg/dl. (She presents with controlled glycemic profile, point-of-care A1c of 7.1%.  She denies hypoglycemia.) An ACE inhibitor/angiotensin II receptor blocker is being taken. She does not see a podiatrist.Eye exam is current (She has not seen her ophthalmologist in 2 years, I urged to reschedule a visit.).  Hyperlipidemia This is a chronic problem. The current episode started more than 1 year ago. The problem is controlled. Exacerbating diseases include diabetes and obesity. Pertinent negatives include no chest pain, myalgias or shortness of breath. Current antihyperlipidemic treatment includes statins. Risk factors for coronary artery disease include diabetes mellitus, dyslipidemia, obesity, a sedentary lifestyle and post-menopausal.     Review of systems  Constitutional: + Progressive weight loss,  current  Body mass index is 42.5 kg/m. , no fatigue, no subjective hyperthermia, no subjective hypothermia Eyes: no blurry vision, no xerophthalmia ENT: no sore throat, no nodules palpated in throat, no dysphagia/odynophagia, no hoarseness Cardiovascular: no Chest Pain, no Shortness of  Breath, no palpitations, no leg swelling Respiratory: no cough, no shortness of breath Gastrointestinal: no Nausea/Vomiting/Diarhhea Musculoskeletal: no muscle/joint aches Skin: no rashes, no hyperemia Neurological: no tremors, no numbness, no tingling, no dizziness Psychiatric: no depression, no anxiety   Objective:    BP 111/76   Pulse 79   Ht 5' 7.5" (1.715 m)   Wt 275 lb 6.4 oz (124.9 kg)   LMP 03/19/2011   BMI 42.50 kg/m   Wt Readings from Last 3 Encounters:  01/20/20 275 lb 6.4 oz (124.9 kg)  10/19/19 279 lb 12.8 oz (126.9 kg)  07/20/18 267 lb (121.1 kg)    Physical Exam- Limited  Constitutional:  Body mass index is 42.5  kg/m. , not in acute distress, normal state of mind Eyes:  EOMI, no exophthalmos Neck: Supple Thyroid: No gross goiter Respiratory: Adequate breathing efforts Musculoskeletal: no gross deformities, strength intact in all four extremities, no gross restriction of joint movements Skin:  no rashes, no hyperemia Neurological: no tremor with outstretched hands,    Recent Results (from the past 2160 hour(s))  COMPLETE METABOLIC PANEL WITH GFR     Status: Abnormal   Collection Time: 01/13/20 11:12 AM  Result Value Ref Range   Glucose, Bld 154 (H) 65 - 99 mg/dL    Comment: .            Fasting reference interval . For someone without known diabetes, a glucose value >125 mg/dL indicates that they may have diabetes and this should be confirmed with a follow-up test. .    BUN 11 7 - 25 mg/dL   Creat 0.84 0.50 - 0.99 mg/dL    Comment: For patients >60 years of age, the reference limit for Creatinine is approximately 13% higher for people identified as African-American. .    GFR, Est Non African American 75 > OR = 60 mL/min/1.31m2   GFR, Est African American 87 > OR = 60 mL/min/1.54m2   BUN/Creatinine Ratio NOT APPLICABLE 6 - 22 (calc)   Sodium 142 135 - 146 mmol/L   Potassium 4.2 3.5 - 5.3 mmol/L   Chloride 107 98 - 110 mmol/L   CO2 28 20 - 32  mmol/L   Calcium 9.5 8.6 - 10.4 mg/dL   Total Protein 6.4 6.1 - 8.1 g/dL   Albumin 4.0 3.6 - 5.1 g/dL   Globulin 2.4 1.9 - 3.7 g/dL (calc)   AG Ratio 1.7 1.0 - 2.5 (calc)   Total Bilirubin 0.4 0.2 - 1.2 mg/dL   Alkaline phosphatase (APISO) 79 37 - 153 U/L   AST 23 10 - 35 U/L   ALT 19 6 - 29 U/L  Microalbumin / creatinine urine ratio     Status: None   Collection Time: 01/13/20 11:12 AM  Result Value Ref Range   Creatinine, Urine 160 20 - 275 mg/dL   Microalb, Ur 0.4 mg/dL    Comment: Reference Range Not established    Microalb Creat Ratio 3 <30 mcg/mg creat    Comment: . The ADA defines abnormalities in albumin excretion as follows: Marland Kitchen Category         Result (mcg/mg creatinine) . Normal                    <30 Microalbuminuria         30-299  Clinical albuminuria   > OR = 300 . The ADA recommends that at least two of three specimens collected within a 3-6 month period be abnormal before considering a patient to be within a diagnostic category.   Lipid panel     Status: Abnormal   Collection Time: 01/13/20 11:12 AM  Result Value Ref Range   Cholesterol 140 <200 mg/dL   HDL 38 (L) > OR = 50 mg/dL   Triglycerides 223 (H) <150 mg/dL    Comment: . If a non-fasting specimen was collected, consider repeat triglyceride testing on a fasting specimen if clinically indicated.  Yates Decamp et al. J. of Clin. Lipidol. 9741;6:384-536. Marland Kitchen    LDL Cholesterol (Calc) 71 mg/dL (calc)    Comment: Reference range: <100 . Desirable range <100 mg/dL for primary prevention;   <70 mg/dL for patients with CHD or diabetic patients  with >  or = 2 CHD risk factors. Marland Kitchen LDL-C is now calculated using the Martin-Hopkins  calculation, which is a validated novel method providing  better accuracy than the Friedewald equation in the  estimation of LDL-C.  Cresenciano Genre et al. Annamaria Helling. 4332;951(88): 2061-2068  (http://education.[facility].com/faq/FAQ164)    Total CHOL/HDL Ratio 3.7 <5.0 (calc)    Non-HDL Cholesterol (Calc) 102 <130 mg/dL (calc)    Comment: For patients with diabetes plus 1 major ASCVD risk  factor, treating to a non-HDL-C goal of <100 mg/dL  (LDL-C of <70 mg/dL) is considered a therapeutic  option.   TSH     Status: None   Collection Time: 01/13/20 11:12 AM  Result Value Ref Range   TSH 1.42 0.40 - 4.50 mIU/L  T4, free     Status: None   Collection Time: 01/13/20 11:12 AM  Result Value Ref Range   Free T4 1.0 0.8 - 1.8 ng/dL  HgB A1c     Status: Abnormal   Collection Time: 01/20/20 10:40 AM  Result Value Ref Range   Hemoglobin A1C 7.1 (A) 4.0 - 5.6 %   HbA1c POC (<> result, manual entry)     HbA1c, POC (prediabetic range)     HbA1c, POC (controlled diabetic range)      Lipid Panel     Component Value Date/Time   CHOL 140 01/13/2020 1112   TRIG 223 (H) 01/13/2020 1112   HDL 38 (L) 01/13/2020 1112   CHOLHDL 3.7 01/13/2020 1112   LDLCALC 71 01/13/2020 1112    Assessment & Plan:   1. Uncontrolled type 2 diabetes mellitus with hyperglycemia ([place])  - Dudley L Ferrell has currently uncontrolled symptomatic type 2 DM since  59 years of age.  She presents with controlled glycemic profile, point-of-care A1c of 7.1%.  She denies hypoglycemia.  Recent labs reviewed.  -her diabetes is complicated by obesity/sedentary life and TAIRA KNABE remains at a high risk for more acute and chronic complications which include CAD, CVA, CKD, retinopathy, and neuropathy. These are all discussed in detail with the patient.  - I have counseled her on diet management and weight loss, by adopting a carbohydrate restricted/protein rich diet.  - she  admits there is a room for improvement in her diet and drink choices. -  Suggestion is made for her to avoid simple carbohydrates  from her diet including Cakes, Sweet Desserts / Pastries, Ice Cream, Soda (diet and regular), Sweet Tea, Candies, Chips, Cookies, Sweet Pastries,  Store Bought Juices, Alcohol in Excess of  1-2  drinks a day, Artificial Sweeteners, Coffee Creamer, and "Sugar-free" Products. This will help patient to have stable blood glucose profile and potentially avoid unintended weight gain.  - I encouraged her to switch to  unprocessed or minimally processed complex starch and increased protein intake (animal or plant source), fruits, and vegetables.  - she is advised to stick to a routine mealtimes to eat 3 meals  a day and avoid unnecessary snacks ( to snack only to correct hypoglycemia).   - I have approached her with the following individualized plan to manage diabetes and patient agrees:   -Based on her presentation with stable A1c of 7.1% overall improving, she will not need intervention at this time.   -She gets coverage for her Bydureon.  She is advised to continue Bydureon 2 mg subcutaneously weekly, Metformin 1000 mg p.o. twice daily.    2) BP/HTN -Her blood pressure is controlled to target.   She is advised to continue  lisinopril/HCTZ 10/12.5 mg p.o. Daily.  3) Lipids/HPL:   Her fasting lipid panel shows LDL at 66.  She is advised to continue pravastatin 40 mg p.o. nightly.  Side effects precautions discussed with her.     4) weight management: Her BMI is 56.9-VXYIAXK complicating her diabetes care.  She is a candidate for modest weight loss.  Carbs restricted diet and exercise regimen were discussed with her.  5) Chronic Care/Health Maintenance:  -she  is on  Statin medications and   lisinopril is encouraged to continue to follow up with Ophthalmology, Dentist,  Podiatrist at least yearly or according to recommendations, and advised to  stay away from smoking. I have recommended yearly flu vaccine and pneumonia vaccination at least every 5 years; moderate intensity exercise for up to 150 minutes weekly; and  sleep for at least 7 hours a day.  - I advised patient to maintain close follow up with Deloria Lair., MD for primary care needs.  - Time spent on this patient care  encounter:  35 min, of which > 50% was spent in  counseling and the rest reviewing her blood glucose logs , discussing her hypoglycemia and hyperglycemia episodes, reviewing her current and  previous labs / studies  ( including abstraction from other facilities) and medications  doses and developing a  long term treatment plan and documenting her care.   Please refer to Patient Instructions for Blood Glucose Monitoring and Insulin/Medications Dosing Guide"  in media tab for additional information. Please  also refer to " Patient Self Inventory" in the Media  tab for reviewed elements of pertinent patient history.  [name] participated in the discussions, expressed understanding, and voiced agreement with the above plans.  All questions were answered to her satisfaction. she is encouraged to contact clinic should she have any questions or concerns prior to her return visit.    Follow up plan: - Return in about 4 months (around 05/21/2020) for Bring Meter and Logs- A1c in Office.  Glade Lloyd, MD [facility] [address] [place], [place] [address] Phone: [phone]  Fax: [phone]    01/20/2020, 2:21 PM  This note was partially dictated with voice recognition software. Similar sounding words can be transcribed inadequately or may not  be corrected upon review.

## 2020-01-20 NOTE — Patient Instructions (Signed)

## 2020-02-01 ENCOUNTER — Other Ambulatory Visit: Payer: Self-pay | Admitting: "Endocrinology

## 2020-02-02 ENCOUNTER — Other Ambulatory Visit: Payer: Self-pay | Admitting: "Endocrinology

## 2020-02-28 ENCOUNTER — Other Ambulatory Visit: Payer: Self-pay | Admitting: "Endocrinology

## 2020-03-31 ENCOUNTER — Other Ambulatory Visit: Payer: Self-pay | Admitting: "Endocrinology

## 2020-05-01 ENCOUNTER — Other Ambulatory Visit: Payer: Self-pay | Admitting: "Endocrinology

## 2020-05-29 ENCOUNTER — Encounter: Payer: Self-pay | Admitting: Nurse Practitioner

## 2020-05-29 ENCOUNTER — Other Ambulatory Visit: Payer: Self-pay

## 2020-05-29 ENCOUNTER — Ambulatory Visit (INDEPENDENT_AMBULATORY_CARE_PROVIDER_SITE_OTHER): Payer: Medicare HMO | Admitting: Nurse Practitioner

## 2020-05-29 VITALS — BP 104/70 | HR 79 | Ht 67.5 in | Wt 266.6 lb

## 2020-05-29 DIAGNOSIS — I10 Essential (primary) hypertension: Secondary | ICD-10-CM

## 2020-05-29 DIAGNOSIS — E1165 Type 2 diabetes mellitus with hyperglycemia: Secondary | ICD-10-CM | POA: Diagnosis not present

## 2020-05-29 DIAGNOSIS — E782 Mixed hyperlipidemia: Secondary | ICD-10-CM

## 2020-05-29 LAB — POCT GLYCOSYLATED HEMOGLOBIN (HGB A1C): Hemoglobin A1C: 6.8 % — AB (ref 4.0–5.6)

## 2020-05-29 NOTE — Patient Instructions (Signed)

## 2020-05-29 NOTE — Progress Notes (Signed)
05/29/2020, 10:50 AM                Endocrinology follow-up note  Subjective:    Patient ID: Tina Sampson, female    DOB: [birth year].  Tina Sampson is being seen in follow-up in the management of currently uncontrolled symptomatic type 2 diabetes, hyperlipidemia, hypertension. PMD:  Deloria Lair., MD.   Past Medical History:  Diagnosis Date  . Abdominal pain 10/15/2017  . Diabetes mellitus   . Diabetes mellitus, type II ([place])   . H/O knee surgery   . Hyperlipidemia    Past Surgical History:  Procedure Laterality Date  . ANKLE SURGERY    . CARDIAC CATHETERIZATION    . catherization    . CERVICAL DISC SURGERY    . CESAREAN SECTION    . CHOLECYSTECTOMY    . KNEE SURGERY    . KNEE SURGERY    . TUBAL LIGATION     Social History   Socioeconomic History  . Marital status: Married    Spouse name: Not on file  . Number of children: Not on file  . Years of education: Not on file  . Highest education level: Not on file  Occupational History  . Not on file  Tobacco Use  . Smoking status: Never Smoker  . Smokeless tobacco: Never Used  Vaping Use  . Vaping Use: Never used  Substance and Sexual Activity  . Alcohol use: No  . Drug use: No  . Sexual activity: Yes  Other Topics Concern  . Not on file  Social History Narrative  . Not on file   Social Determinants of Health   Financial Resource Strain:   . Difficulty of Paying Living Expenses: Not on file  Food Insecurity:   . Worried About Charity fundraiser in the Last Year: Not on file  . Ran Out of Food in the Last Year: Not on file  Transportation Needs:   . Lack of Transportation (Medical): Not on file  . Lack of Transportation (Non-Medical): Not on file  Physical Activity:   . Days of Exercise per Week: Not on file  . Minutes of Exercise per Session: Not on file  Stress:   . Feeling of Stress : Not on file  Social  Connections:   . Frequency of Communication with Friends and Family: Not on file  . Frequency of Social Gatherings with Friends and Family: Not on file  . Attends Religious Services: Not on file  . Active Member of Clubs or Organizations: Not on file  . Attends Archivist Meetings: Not on file  . Marital Status: Not on file   Outpatient Encounter Medications as of 05/29/2020  Medication Sig  . Accu-Chek FastClix Lancets MISC   . Alcohol Swabs (B-D SINGLE USE SWABS REGULAR) PADS   . ascorbic acid (VITAMIN C) 500 MG tablet Take by mouth.  Marland Kitchen aspirin EC 81 MG tablet Take 81 mg daily by mouth.  . B Complex Vitamins (VITAMIN B COMPLEX PO) Take daily by mouth.  . baclofen (LIORESAL) 10 MG tablet TAKE 1 TABLET TWICE DAILY AS NEEDED FOR MUSCLE  SPASM  . BYDUREON BCISE 2 MG/0.85ML AUIJ INJECT 2 MG INTO SKIN ONCE A WEEK  . celecoxib (CELEBREX) 200 MG capsule Take 200 mg by mouth daily.  . clobetasol ointment (TEMOVATE) 0.05 % Apply topically.  . diphenhydramine-acetaminophen (TYLENOL PM) 25-500 MG TABS tablet Take 1 tablet by mouth at bedtime as needed.  Marland Kitchen EPINEPHrine 0.3 mg/0.3 mL IJ SOAJ injection Inject into the muscle.  . gabapentin (NEURONTIN) 300 MG capsule Take 900 mg by mouth 2 (two) times daily.   Marland Kitchen glucose blood (ACCU-CHEK GUIDE) test strip TEST BLOOD SUGAR THREE TIMES DAILY  . hydrOXYzine (ATARAX/VISTARIL) 25 MG tablet Take by mouth.  Marland Kitchen lisinopril-hydrochlorothiazide (ZESTORETIC) 10-12.5 MG tablet TAKE 1 TABLET EVERY DAY  . metFORMIN (GLUCOPHAGE) 500 MG tablet Take 2 tablets by mouth 2 (two) times daily with a meal. 1000 mg in the a.m & 1000 mg in the p.m.   . Multiple Vitamin (MULTIVITAMIN) tablet Take 1 tablet daily by mouth.  Marland Kitchen omeprazole (PRILOSEC) 20 MG capsule Take 40 mg by mouth daily.   . pramipexole (MIRAPEX) 0.125 MG tablet Take 0.125 mg by mouth at bedtime. For RLS  . pravastatin (PRAVACHOL) 40 MG tablet Take 40 mg by mouth daily.  . propranolol (INDERAL) 10 MG  tablet   . traZODone (DESYREL) 50 MG tablet Take 50 mg by mouth at bedtime.  Marland Kitchen venlafaxine XR (EFFEXOR-XR) 150 MG 24 hr capsule Take by mouth.   No facility-administered encounter medications on file as of 05/29/2020.    ALLERGIES: Allergies  Allergen Reactions  . Bee Venom Shortness Of Breath, Itching and Swelling    VACCINATION STATUS: Immunization History  Administered Date(s) Administered  . Influenza Split 05/07/2013  . Moderna SARS-COVID-2 Vaccination 10/05/2019, 11/02/2019    Diabetes She presents for her follow-up diabetic visit. She has type 2 diabetes mellitus. Onset time: She was diagnosed at approximate age of 55 years. Her disease course has been stable. There are no hypoglycemic associated symptoms. Pertinent negatives for hypoglycemia include no confusion, headaches, pallor or seizures. Pertinent negatives for diabetes include no blurred vision, no chest pain, no polydipsia, no polyphagia and no polyuria. There are no hypoglycemic complications. Symptoms are stable. There are no diabetic complications. Risk factors for coronary artery disease include dyslipidemia, diabetes mellitus, obesity, sedentary lifestyle, post-menopausal and hypertension. Current diabetic treatment includes oral agent (monotherapy) (Bydrueon 2 mg SQ weekly). She is compliant with treatment most of the time. Her weight is decreasing steadily. She is following a generally unhealthy diet. When asked about meal planning, she reported none. She has not had a previous visit with a dietitian. She never participates in exercise. Her home blood glucose trend is decreasing steadily. Her breakfast blood glucose range is generally 140-180 mg/dl. (She presents today with her meter, no logs, showing 14-day average of 171, 30-day average of 169, and 90-day average of 160.  She denies any hypoglycemia.  Her POCT A1C today is 6.8%, improving from last visit of 7.1%.) An ACE inhibitor/angiotensin II receptor blocker is being  taken. She does not see a podiatrist.Eye exam is current (She has not seen her ophthalmologist in 2 years, I urged to reschedule a visit.).  Hyperlipidemia This is a chronic problem. The current episode started more than 1 year ago. The problem is controlled. Recent lipid tests were reviewed and are variable. Exacerbating diseases include diabetes and obesity. Factors aggravating her hyperlipidemia include beta blockers, fatty foods and thiazides. Pertinent negatives include no chest pain, myalgias or shortness of breath. Current antihyperlipidemic treatment  includes statins. The current treatment provides moderate improvement of lipids. There are no compliance problems.  Risk factors for coronary artery disease include diabetes mellitus, dyslipidemia, obesity, a sedentary lifestyle, post-menopausal and hypertension.   Review of systems  Constitutional: + progressively losing weight- intentionally,  current Body mass index is 41.14 kg/m. , no fatigue, no subjective hyperthermia, no subjective hypothermia Eyes: no blurry vision, no xerophthalmia ENT: no sore throat, no nodules palpated in throat, no dysphagia/odynophagia, no hoarseness Cardiovascular: no chest pain, no shortness of breath, no palpitations, no leg swelling Respiratory: no cough, no shortness of breath Gastrointestinal: no nausea/vomiting/diarrhea Musculoskeletal: no muscle/joint aches Skin: no rashes, no hyperemia Neurological: no tremors, no numbness, no tingling, no dizziness Psychiatric: no depression, no anxiety  Objective:    BP 104/70 (BP Location: Left Arm, Patient Position: Sitting)   Pulse 79   Ht 5' 7.5" (1.715 m)   Wt 266 lb 9.6 oz (120.9 kg)   LMP 03/19/2011   BMI 41.14 kg/m   Wt Readings from Last 3 Encounters:  05/29/20 266 lb 9.6 oz (120.9 kg)  01/20/20 275 lb 6.4 oz (124.9 kg)  10/19/19 279 lb 12.8 oz (126.9 kg)    BP Readings from Last 3 Encounters:  05/29/20 104/70  01/20/20 111/76  10/19/19 127/82     Physical Exam- Limited  Constitutional:  Body mass index is 41.14 kg/m. , not in acute distress, normal state of mind Eyes:  EOMI, no exophthalmos Neck: Supple Thyroid: No gross goiter Respiratory: Adequate breathing efforts Musculoskeletal: no gross deformities, strength intact in all four extremities, no gross restriction of joint movements Skin:  no rashes, no hyperemia Neurological: no tremor with outstretched hands,    Recent Results (from the past 2160 hour(s))  HgB A1c     Status: Abnormal   Collection Time: 05/29/20 10:28 AM  Result Value Ref Range   Hemoglobin A1C 6.8 (A) 4.0 - 5.6 %   HbA1c POC (<> result, manual entry)     HbA1c, POC (prediabetic range)     HbA1c, POC (controlled diabetic range)      Lipid Panel     Component Value Date/Time   CHOL 140 01/13/2020 1112   TRIG 223 (H) 01/13/2020 1112   HDL 38 (L) 01/13/2020 1112   CHOLHDL 3.7 01/13/2020 1112   LDLCALC 71 01/13/2020 1112    Assessment & Plan:   1. Uncontrolled type 2 diabetes mellitus with hyperglycemia ([place])  - Tina Sampson has currently uncontrolled symptomatic type 2 DM since  59 years of age.  She presents today with her meter, no logs, showing 14-day average of 171, 30-day average of 169, and 90-day average of 160.  She denies any hypoglycemia.  Her POCT A1C today is 6.8%, improving from last visit of 7.1%.   Recent labs reviewed.  -her diabetes is complicated by obesity/sedentary life and Tina Sampson remains at a high risk for more acute and chronic complications which include CAD, CVA, CKD, retinopathy, and neuropathy. These are all discussed in detail with the patient.  - Nutritional counseling repeated at each appointment due to patients tendency to fall back in to old habits.  - The patient admits there is a room for improvement in their diet and drink choices. -  Suggestion is made for the patient to avoid simple carbohydrates from their diet including Cakes, Sweet  Desserts / Pastries, Ice Cream, Soda (diet and regular), Sweet Tea, Candies, Chips, Cookies, Sweet Pastries,  Store Bought Juices, Alcohol in Excess of  1-2 drinks a day, Artificial Sweeteners, Coffee Creamer, and "Sugar-free" Products. This will help patient to have stable blood glucose profile and potentially avoid unintended weight gain.   - I encouraged the patient to switch to  unprocessed or minimally processed complex starch and increased protein intake (animal or plant source), fruits, and vegetables.   - Patient is advised to stick to a routine mealtimes to eat 3 meals  a day and avoid unnecessary snacks ( to snack only to correct hypoglycemia).  - I have approached her with the following individualized plan to manage diabetes and patient agrees:   -Given her stable presentation, she is advised to continue current medication regimen: Metformin 1000 mg po twice daily with meals and Bydureon 2 mg SQ weekly.  2) BP/HTN Her blood pressure is controlled to target.  She is advised to continue Lisinopril HCT 10-12.5 mg po daily and propanolol 10 mg po daily.  3) Lipids/HPL:   Her most recent lipid panel from 01/13/20 shows controlled LDL of 71 and elevated triglycerides of 223.  She is advised to continue Pravastatin 40 mg po daily before bed.  Side effects and precautions discussed with her.  4) Weight management:  Her Body mass index is 41.14 kg/m.--clearly complicating her diabetes care.  She is a candidate for modest weight loss and has lost weight progressively.  Carbs restricted diet and exercise regimen were discussed with her.  5) Chronic Care Health Maintenance: -she  is on  Statin medications and   lisinopril is encouraged to continue to follow up with Ophthalmology, Dentist,  Podiatrist at least yearly or according to recommendations, and advised to  stay away from smoking. I have recommended yearly flu vaccine and pneumonia vaccination at least every 5 years; moderate intensity  exercise for up to 150 minutes weekly; and  sleep for at least 7 hours a day.  - I advised patient to maintain close follow up with Deloria Lair., MD for primary care needs.  - Time spent on this patient care encounter:  30 min, of which > 50% was spent in  counseling and the rest reviewing her blood glucose logs , discussing her hypoglycemia and hyperglycemia episodes, reviewing her current and  previous labs / studies  ( including abstraction from other facilities) and medications  doses and developing a  long term treatment plan and documenting her care.   Please refer to Patient Instructions for Blood Glucose Monitoring and Insulin/Medications Dosing Guide"  in media tab for additional information. Please  also refer to " Patient Self Inventory" in the Media  tab for reviewed elements of pertinent patient history.  Tina Sampson participated in the discussions, expressed understanding, and voiced agreement with the above plans.  All questions were answered to her satisfaction. she is encouraged to contact clinic should she have any questions or concerns prior to her return visit.    Follow up plan: - Return in about 4 months (around 09/29/2020) for Diabetes follow up with A1c in office, Previsit labs, Bring glucometer and logs.  Tina Sampson, [facility] [address] [place], [place] [address] Phone: [phone] Fax: [phone]   05/29/2020, 10:50 AM  This note was partially dictated with voice recognition software. Similar sounding words can be transcribed inadequately or may not  be corrected upon review.

## 2020-07-05 ENCOUNTER — Other Ambulatory Visit: Payer: Self-pay | Admitting: "Endocrinology

## 2020-07-18 ENCOUNTER — Other Ambulatory Visit: Payer: Self-pay | Admitting: "Endocrinology

## 2020-08-09 ENCOUNTER — Other Ambulatory Visit: Payer: Self-pay | Admitting: "Endocrinology

## 2020-09-26 LAB — COMPREHENSIVE METABOLIC PANEL
ALT: 20 IU/L (ref 0–32)
AST: 25 IU/L (ref 0–40)
Albumin/Globulin Ratio: 1.6 (ref 1.2–2.2)
Albumin: 4.3 g/dL (ref 3.8–4.8)
Alkaline Phosphatase: 88 IU/L (ref 44–121)
BUN/Creatinine Ratio: 11 — ABNORMAL LOW (ref 12–28)
BUN: 10 mg/dL (ref 8–27)
Bilirubin Total: 0.3 mg/dL (ref 0.0–1.2)
CO2: 21 mmol/L (ref 20–29)
Calcium: 9.8 mg/dL (ref 8.7–10.3)
Chloride: 103 mmol/L (ref 96–106)
Creatinine, Ser: 0.91 mg/dL (ref 0.57–1.00)
GFR calc Af Amer: 78 mL/min/{1.73_m2} (ref >59)
GFR calc non Af Amer: 68 mL/min/{1.73_m2} (ref >59)
Globulin, Total: 2.7 g/dL (ref 1.5–4.5)
Glucose: 91 mg/dL (ref 65–99)
Potassium: 4.6 mmol/L (ref 3.5–5.2)
Sodium: 141 mmol/L (ref 134–144)
Total Protein: 7 g/dL (ref 6.0–8.5)

## 2020-10-02 ENCOUNTER — Other Ambulatory Visit: Payer: Self-pay

## 2020-10-02 ENCOUNTER — Ambulatory Visit (INDEPENDENT_AMBULATORY_CARE_PROVIDER_SITE_OTHER): Payer: Medicare HMO | Admitting: "Endocrinology

## 2020-10-02 ENCOUNTER — Ambulatory Visit: Payer: Medicare HMO | Admitting: "Endocrinology

## 2020-10-02 ENCOUNTER — Encounter: Payer: Self-pay | Admitting: "Endocrinology

## 2020-10-02 VITALS — BP 111/70 | HR 76 | Ht 67.5 in | Wt 261.8 lb

## 2020-10-02 DIAGNOSIS — E1165 Type 2 diabetes mellitus with hyperglycemia: Secondary | ICD-10-CM | POA: Diagnosis not present

## 2020-10-02 DIAGNOSIS — I10 Essential (primary) hypertension: Secondary | ICD-10-CM

## 2020-10-02 DIAGNOSIS — E782 Mixed hyperlipidemia: Secondary | ICD-10-CM

## 2020-10-02 LAB — POCT GLYCOSYLATED HEMOGLOBIN (HGB A1C): HbA1c, POC (controlled diabetic range): 6.3 % (ref 0.0–7.0)

## 2020-10-02 NOTE — Progress Notes (Signed)
10/02/2020, 4:59 PM                Endocrinology follow-up note  Subjective:    Patient ID: Tina Sampson, female    DOB: 10-21-1960.  Tina Sampson is being seen in follow-up in the management of currently uncontrolled symptomatic type 2 diabetes, hyperlipidemia, hypertension. PMD:  Deloria Lair., MD.   Past Medical History:  Diagnosis Date  . Abdominal pain 10/15/2017  . Diabetes mellitus   . Diabetes mellitus, type II ([place])   . H/O knee surgery   . Hyperlipidemia    Past Surgical History:  Procedure Laterality Date  . ANKLE SURGERY    . CARDIAC CATHETERIZATION    . catherization    . CERVICAL DISC SURGERY    . CESAREAN SECTION    . CHOLECYSTECTOMY    . KNEE SURGERY    . KNEE SURGERY    . TUBAL LIGATION     Social History   Socioeconomic History  . Marital status: Married    Spouse name: Not on file  . Number of children: Not on file  . Years of education: Not on file  . Highest education level: Not on file  Occupational History  . Not on file  Tobacco Use  . Smoking status: Never Smoker  . Smokeless tobacco: Never Used  Vaping Use  . Vaping Use: Never used  Substance and Sexual Activity  . Alcohol use: No  . Drug use: No  . Sexual activity: Yes  Other Topics Concern  . Not on file  Social History Narrative  . Not on file   Social Determinants of Health   Financial Resource Strain: Not on file  Food Insecurity: Not on file  Transportation Needs: Not on file  Physical Activity: Not on file  Stress: Not on file  Social Connections: Not on file   Outpatient Encounter Medications as of 10/02/2020  Medication Sig  . Accu-Chek FastClix Lancets MISC   . Alcohol Swabs (B-D SINGLE USE SWABS REGULAR) PADS   . ascorbic acid (VITAMIN C) 500 MG tablet Take by mouth.  Marland Kitchen aspirin EC 81 MG tablet Take 81 mg daily by mouth.  . B Complex Vitamins (VITAMIN B COMPLEX PO) Take  daily by mouth.  . baclofen (LIORESAL) 10 MG tablet TAKE 1 TABLET TWICE DAILY AS NEEDED FOR MUSCLE SPASM  . BYDUREON BCISE 2 MG/0.85ML AUIJ INJECT 2 MG INTO SKIN ONCE A WEEK  . celecoxib (CELEBREX) 200 MG capsule Take 200 mg by mouth daily. (Patient not taking: Reported on 10/02/2020)  . clobetasol ointment (TEMOVATE) 0.05 % Apply topically.  . diphenhydramine-acetaminophen (TYLENOL PM) 25-500 MG TABS tablet Take 1 tablet by mouth at bedtime as needed.  Marland Kitchen EPINEPHrine 0.3 mg/0.3 mL IJ SOAJ injection Inject into the muscle.  . gabapentin (NEURONTIN) 300 MG capsule Take 900 mg by mouth 2 (two) times daily.   Marland Kitchen glucose blood (ACCU-CHEK GUIDE) test strip TEST BLOOD SUGAR THREE TIMES DAILY  . hydrOXYzine (ATARAX/VISTARIL) 25 MG tablet Take by mouth.  Marland Kitchen lisinopril-hydrochlorothiazide (ZESTORETIC) 10-12.5 MG tablet TAKE 1 TABLET EVERY DAY  . Multiple Vitamin (MULTIVITAMIN) tablet  Take 1 tablet daily by mouth.  Marland Kitchen omeprazole (PRILOSEC) 20 MG capsule Take 40 mg by mouth daily.   . pramipexole (MIRAPEX) 0.125 MG tablet Take 0.125 mg by mouth at bedtime. For RLS  . pravastatin (PRAVACHOL) 40 MG tablet Take 40 mg by mouth daily.  . propranolol (INDERAL) 10 MG tablet   . traZODone (DESYREL) 50 MG tablet Take 50 mg by mouth at bedtime.  . [DISCONTINUED] metFORMIN (GLUCOPHAGE) 500 MG tablet Take 2 tablets by mouth 2 (two) times daily with a meal. 1000 mg in the a.m & 1000 mg in the p.m.    No facility-administered encounter medications on file as of 10/02/2020.    ALLERGIES: Allergies  Allergen Reactions  . Bee Venom Shortness Of Breath, Itching and Swelling    VACCINATION STATUS: Immunization History  Administered Date(s) Administered  . Influenza Split 05/07/2013  . Moderna Sars-Covid-2 Vaccination 10/05/2019, 11/02/2019    Diabetes She presents for her follow-up diabetic visit. She has type 2 diabetes mellitus. Onset time: She was diagnosed at approximate age of 1 years. Her disease course has  been improving. There are no hypoglycemic associated symptoms. Pertinent negatives for hypoglycemia include no confusion, headaches, pallor or seizures. Pertinent negatives for diabetes include no blurred vision, no chest pain, no polydipsia, no polyphagia and no polyuria. There are no hypoglycemic complications. Symptoms are improving. There are no diabetic complications. Risk factors for coronary artery disease include dyslipidemia, diabetes mellitus, obesity, sedentary lifestyle and post-menopausal. Current diabetic treatment includes oral agent (monotherapy). Her weight is decreasing steadily. She is following a generally unhealthy diet. When asked about meal planning, she reported none. She has not had a previous visit with a dietitian. She never participates in exercise. Her home blood glucose trend is decreasing steadily. Her breakfast blood glucose range is generally 130-140 mg/dl. (She presents with continued improvement in her glycemic profile.  Point-of-care A1c is 6.3% improving from 8.4%.  She did not document any hypoglycemia.  She wishes to come off of some of her medications.   ) An ACE inhibitor/angiotensin II receptor blocker is being taken. She does not see a podiatrist.Eye exam is current (She has not seen her ophthalmologist in 2 years, I urged to reschedule a visit.).  Hyperlipidemia This is a chronic problem. The current episode started more than 1 year ago. The problem is controlled. Exacerbating diseases include diabetes and obesity. Pertinent negatives include no chest pain, myalgias or shortness of breath. Current antihyperlipidemic treatment includes statins. Risk factors for coronary artery disease include diabetes mellitus, dyslipidemia, obesity, a sedentary lifestyle and post-menopausal.     Review of systems  Constitutional: + Progressive weight loss,  current  Body mass index is 40.4 kg/m. , no fatigue, no subjective hyperthermia, no subjective hypothermia   Objective:     BP 111/70   Pulse 76   Ht 5' 7.5" (1.715 m)   Wt 261 lb 12.8 oz (118.8 kg)   LMP 03/19/2011   BMI 40.40 kg/m   Wt Readings from Last 3 Encounters:  10/02/20 261 lb 12.8 oz (118.8 kg)  05/29/20 266 lb 9.6 oz (120.9 kg)  01/20/20 275 lb 6.4 oz (124.9 kg)    Physical Exam- Limited  Constitutional:  Body mass index is 40.4 kg/m. , not in acute distress, normal state of mind   Recent Results (from the past 2160 hour(s))  Comprehensive metabolic panel     Status: Abnormal   Collection Time: 09/25/20  1:18 PM  Result Value Ref Range  Glucose 91 65 - 99 mg/dL   BUN 10 8 - 27 mg/dL   Creatinine, Ser 0.91 0.57 - 1.00 mg/dL    Comment:                **Effective October 01, 2020 [facility] will begin**                  reporting the 2021 CKD-EPI creatinine equation that                  estimates kidney function without a race variable.    GFR calc non Af Amer 68 >59 mL/min/1.73   GFR calc Af Amer 78 >59 mL/min/1.73    Comment: **In accordance with recommendations from the NKF-ASN Task force,**   [facility] is in the process of updating its eGFR calculation to the   2021 CKD-EPI creatinine equation that estimates kidney function   without a race variable.    BUN/Creatinine Ratio 11 (L) 12 - 28   Sodium 141 134 - 144 mmol/L   Potassium 4.6 3.5 - 5.2 mmol/L   Chloride 103 96 - 106 mmol/L   CO2 21 20 - 29 mmol/L   Calcium 9.8 8.7 - 10.3 mg/dL   Total Protein 7.0 6.0 - 8.5 g/dL   Albumin 4.3 3.8 - 4.8 g/dL   Globulin, Total 2.7 1.5 - 4.5 g/dL   Albumin/Globulin Ratio 1.6 1.2 - 2.2   Bilirubin Total 0.3 0.0 - 1.2 mg/dL   Alkaline Phosphatase 88 44 - 121 IU/L   AST 25 0 - 40 IU/L   ALT 20 0 - 32 IU/L  HgB A1c     Status: None   Collection Time: 10/02/20  3:11 PM  Result Value Ref Range   Hemoglobin A1C     HbA1c POC (<> result, manual entry)     HbA1c, POC (prediabetic range)     HbA1c, POC (controlled diabetic range) 6.3 0.0 - 7.0 %    Lipid Panel     Component Value  Date/Time   CHOL 140 01/13/2020 1112   TRIG 223 (H) 01/13/2020 1112   HDL 38 (L) 01/13/2020 1112   CHOLHDL 3.7 01/13/2020 1112   LDLCALC 71 01/13/2020 1112    Assessment & Plan:   1. Uncontrolled type 2 diabetes mellitus with hyperglycemia ([place])  - Chyanne L Sukhu has currently uncontrolled symptomatic type 2 DM since  60 years of age.  She presents with continued improvement in her glycemic profile.  Point-of-care A1c is 6.3% improving from 8.4%.  She did not document any hypoglycemia.  She wishes to come off of some of her medications.     Recent labs reviewed.  -her diabetes is complicated by obesity/sedentary life and MACIL CRADY remains at a high risk for more acute and chronic complications which include CAD, CVA, CKD, retinopathy, and neuropathy. These are all discussed in detail with the patient.  - I have counseled her on diet management and weight loss, by adopting a carbohydrate restricted/protein rich diet.  - she acknowledges that there is a room for improvement in her food and drink choices. - Suggestion is made for her to avoid simple carbohydrates  from her diet including Cakes, Sweet Desserts, Ice Cream, Soda (diet and regular), Sweet Tea, Candies, Chips, Cookies, Store Bought Juices, Alcohol in Excess of  1-2 drinks a day, Artificial Sweeteners,  Coffee Creamer, and "Sugar-free" Products, Lemonade. This will help patient to have more stable blood glucose profile and potentially avoid unintended  weight gain.  - I encouraged her to switch to  unprocessed or minimally processed complex starch and increased protein intake (animal or plant source), fruits, and vegetables.  - she is advised to stick to a routine mealtimes to eat 3 meals  a day and avoid unnecessary snacks ( to snack only to correct hypoglycemia).   - I have approached her with the following individualized plan to manage diabetes and patient agrees:   -Based on her presentation with adequate weight  loss, A1c of 6.3%, she will not need insulin intervention at this time.  She may come off of Metformin for now.  She is advised to continue Bydureon 2 mg subcutaneously weekly.      2) BP/HTN -Her blood pressure is controlled to target.  She is advised to continue lisinopril/HCTZ 10/12.5 mg p.o. daily at breakfast.   3) Lipids/HPL:   Her fasting lipid panel shows LDL at 66.  She is advised to continue pravastatin 40 mg p.o. nightly.   Side effects precautions discussed with her.     4) weight management: Her BMI is 40.4-currently losing weight progressively and intentionally.  She is a candidate for modest weight loss.  Carbs restricted diet and exercise regimen were discussed with her.  5) Chronic Care/Health Maintenance:  -she  is on  Statin medications and   lisinopril is encouraged to continue to follow up with Ophthalmology, Dentist,  Podiatrist at least yearly or according to recommendations, and advised to  stay away from smoking. I have recommended yearly flu vaccine and pneumonia vaccination at least every 5 years; moderate intensity exercise for up to 150 minutes weekly; and  sleep for at least 7 hours a day.   POCT ABI Results 10/02/20  Her ABIs abnormal today October 02, 2020. Right ABI: 1.25      left ABI:  PAD  Right leg systolic / diastolic: 622/63 mmHg Left leg systolic / diastolic: PAD mmHg  Arm systolic / diastolic: 335/45 mmHG  She will be referred to vascular surgery for better evaluation.   - I advised patient to maintain close follow up with Deloria Lair., MD for primary care needs.  - Time spent on this patient care encounter:  40 min, of which > 50% was spent in  counseling and the rest reviewing her blood glucose logs , discussing her hypoglycemia and hyperglycemia episodes, reviewing her current and  previous labs / studies  ( including abstraction from other facilities) and medications  doses and developing a  long term treatment plan and documenting her  care.   Please refer to Patient Instructions for Blood Glucose Monitoring and Insulin/Medications Dosing Guide"  in media tab for additional information. Please  also refer to " Patient Self Inventory" in the Media  tab for reviewed elements of pertinent patient history.  [name] participated in the discussions, expressed understanding, and voiced agreement with the above plans.  All questions were answered to her satisfaction. she is encouraged to contact clinic should she have any questions or concerns prior to her return visit.   Follow up plan: - Return in about 4 months (around 02/01/2021) for Bring Meter and Logs- A1c in Office.  Glade Lloyd, MD [facility] [address] [place], [place] [address] Phone: [phone]  Fax: [phone]    10/02/2020, 4:59 PM  This note was partially dictated with voice recognition software. Similar sounding words can be transcribed inadequately or may not  be corrected upon review.

## 2020-10-02 NOTE — Progress Notes (Signed)
Requesting a refill for metformin. Uses [facility].

## 2020-10-02 NOTE — Patient Instructions (Signed)

## 2020-10-07 ENCOUNTER — Other Ambulatory Visit: Payer: Self-pay | Admitting: "Endocrinology

## 2020-10-31 ENCOUNTER — Other Ambulatory Visit: Payer: Self-pay | Admitting: *Deleted

## 2020-10-31 DIAGNOSIS — M79606 Pain in leg, unspecified: Secondary | ICD-10-CM

## 2020-11-05 ENCOUNTER — Other Ambulatory Visit: Payer: Self-pay

## 2020-11-05 ENCOUNTER — Ambulatory Visit: Payer: Medicare HMO | Admitting: Vascular Surgery

## 2020-11-05 ENCOUNTER — Ambulatory Visit (INDEPENDENT_AMBULATORY_CARE_PROVIDER_SITE_OTHER): Payer: Medicare HMO

## 2020-11-05 ENCOUNTER — Encounter: Payer: Self-pay | Admitting: Vascular Surgery

## 2020-11-05 VITALS — BP 108/77 | HR 88 | Temp 98.6°F | Resp 16 | Ht 68.0 in | Wt 258.0 lb

## 2020-11-05 DIAGNOSIS — R6889 Other general symptoms and signs: Secondary | ICD-10-CM

## 2020-11-05 DIAGNOSIS — M79606 Pain in leg, unspecified: Secondary | ICD-10-CM | POA: Diagnosis not present

## 2020-11-05 NOTE — Progress Notes (Signed)
[facility] [place]  Patient name: Tina Sampson MRN: [id number] DOB: 04/18/1961 Sex: female  REASON FOR CONSULT: Discuss abnormal screening ABI  HPI: Tina Sampson is a 60 y.o. female, who is here today for discussion of abnormal screening ABI and formal noninvasive studies.  She is a non-insulin diabetic.  She had an abnormal screening study.  She has no claudication symptoms and no history of lower extremity tissue loss.  Past Medical History:  Diagnosis Date  . Abdominal pain 10/15/2017  . Diabetes mellitus   . Diabetes mellitus, type II ([place])   . H/O knee surgery   . Hyperlipidemia     Family History  Problem Relation Age of Onset  . Cancer Mother   . Heart failure Mother   . Hyperlipidemia Mother   . Diabetes Mother   . Osteoarthritis Sister   . Heart failure Brother     SOCIAL HISTORY: Social History   Socioeconomic History  . Marital status: Married    Spouse name: Not on file  . Number of children: Not on file  . Years of education: Not on file  . Highest education level: Not on file  Occupational History  . Not on file  Tobacco Use  . Smoking status: Never Smoker  . Smokeless tobacco: Never Used  Vaping Use  . Vaping Use: Never used  Substance and Sexual Activity  . Alcohol use: No  . Drug use: No  . Sexual activity: Yes  Other Topics Concern  . Not on file  Social History Narrative  . Not on file   Social Determinants of Health   Financial Resource Strain: Not on file  Food Insecurity: Not on file  Transportation Needs: Not on file  Physical Activity: Not on file  Stress: Not on file  Social Connections: Not on file  Intimate Partner Violence: Not on file    Allergies  Allergen Reactions  . Bee Venom Shortness Of Breath, Itching and Swelling    Current Outpatient Medications  Medication Sig Dispense Refill  . Accu-Chek FastClix Lancets MISC     . Alcohol Swabs (B-D  SINGLE USE SWABS REGULAR) PADS     . ascorbic acid (VITAMIN C) 500 MG tablet Take by mouth.    Marland Kitchen aspirin EC 81 MG tablet Take 81 mg daily by mouth.    . B Complex Vitamins (VITAMIN B COMPLEX PO) Take daily by mouth.    . baclofen (LIORESAL) 10 MG tablet TAKE 1 TABLET TWICE DAILY AS NEEDED FOR MUSCLE SPASM    . BYDUREON BCISE 2 MG/0.85ML AUIJ INJECT 2 MG INTO SKIN ONCE A WEEK 3.4 mL 2  . clobetasol ointment (TEMOVATE) 0.05 % Apply topically.    . diphenhydramine-acetaminophen (TYLENOL PM) 25-500 MG TABS tablet Take 1 tablet by mouth at bedtime as needed.    Marland Kitchen EPINEPHrine 0.3 mg/0.3 mL IJ SOAJ injection Inject into the muscle.    . gabapentin (NEURONTIN) 300 MG capsule Take 900 mg by mouth 2 (two) times daily.     Marland Kitchen glucose blood (ACCU-CHEK GUIDE) test strip TEST BLOOD SUGAR THREE TIMES DAILY    . hydrOXYzine (ATARAX/VISTARIL) 25 MG tablet Take by mouth.    Marland Kitchen lisinopril-hydrochlorothiazide (ZESTORETIC) 10-12.5 MG tablet TAKE 1 TABLET EVERY DAY 90 tablet 0  . Multiple Vitamin (MULTIVITAMIN) tablet Take 1 tablet daily by mouth.    Marland Kitchen omeprazole (PRILOSEC) 20 MG capsule Take 40 mg by mouth daily.     . pramipexole (MIRAPEX) 0.125  MG tablet Take 0.125 mg by mouth at bedtime. For RLS    . pravastatin (PRAVACHOL) 40 MG tablet Take 40 mg by mouth daily.    . propranolol (INDERAL) 10 MG tablet     . traZODone (DESYREL) 50 MG tablet Take 50 mg by mouth at bedtime.     No current facility-administered medications for this visit.    REVIEW OF SYSTEMS:  [X]  denotes positive finding, [ ]  denotes negative finding Cardiac  Comments:  Chest pain or chest pressure:    Shortness of breath upon exertion:    Short of breath when lying flat:    Irregular heart rhythm:        Vascular    Pain in calf, thigh, or hip brought on by ambulation: x   Pain in feet at night that wakes you up from your sleep:  x   Blood clot in your veins:    Leg swelling:         Pulmonary    Oxygen at home:    Productive  cough:     Wheezing:         Neurologic    Sudden weakness in arms or legs:     Sudden numbness in arms or legs:     Sudden onset of difficulty speaking or slurred speech:    Temporary loss of vision in one eye:     Problems with dizziness:         Gastrointestinal    Blood in stool:     Vomited blood:         Genitourinary    Burning when urinating:     Blood in urine:        Psychiatric    Major depression:         Hematologic    Bleeding problems:    Problems with blood clotting too easily:        Skin    Rashes or ulcers:        Constitutional    Fever or chills:      PHYSICAL EXAM: Vitals:   11/05/20 1020  BP: 108/77  Pulse: 88  Resp: 16  Temp: 98.6 F (37 C)  TempSrc: Oral  SpO2: 96%  Weight: 258 lb (117 kg)  Height: 5\' 8"  (1.727 m)    GENERAL: The patient is a well-nourished female, in no acute distress. The vital signs are documented above. CARDIOVASCULAR: 2+ radial pulses bilaterally.  2+ popliteal pulses bilaterally.  2+ posterior tibial pulses bilaterally. PULMONARY: There is good air exchange  MUSCULOSKELETAL: There are no major deformities or cyanosis. NEUROLOGIC: No focal weakness or paresthesias are detected. SKIN: There are no ulcers or rashes noted. PSYCHIATRIC: The patient has a normal affect.  DATA:  Noninvasive studies today were reviewed with the patient.  This reveals normal ankle arm index bilaterally and normal triphasic waveforms bilaterally  MEDICAL ISSUES: I reviewed the studies with the patient.  No evidence of peripheral artery occlusive disease.  She will continue typical surveillance of her feet for ulcerations or difficulty.  She will see [place] again on an as-needed basis   Rosetta Posner, MD [facility] Tel [phone] Pager [phone]  Note: Portions of this report may have been transcribed using voice recognition software.  Every effort has been made to ensure accuracy;  however, inadvertent computerized transcription errors may still be present.

## 2020-11-27 ENCOUNTER — Telehealth: Payer: Self-pay | Admitting: "Endocrinology

## 2020-11-27 NOTE — Telephone Encounter (Signed)
Pt went to [place] and lost her meter and she said that [employer] is supposed to be sending over a request for a new accucheck meter. Please advise.

## 2020-11-27 NOTE — Telephone Encounter (Signed)
I haven't seen anything, have you?

## 2020-11-28 NOTE — Telephone Encounter (Signed)
I have not

## 2020-11-28 NOTE — Telephone Encounter (Signed)
I called patient and advised that we had not received a request yet, advised patient that we have a meter in office she can pick up if she would like, patient will stop by and get meter.

## 2020-11-29 ENCOUNTER — Other Ambulatory Visit: Payer: Self-pay

## 2020-11-29 ENCOUNTER — Telehealth: Payer: Self-pay

## 2020-11-29 MED ORDER — ACCU-CHEK FASTCLIX LANCETS MISC: 3 refills | Status: DC

## 2020-11-29 MED ORDER — ACCU-CHEK GUIDE VI STRP: ORAL_STRIP | 3 refills | Status: DC

## 2020-11-29 NOTE — Telephone Encounter (Signed)
Patient picked up the sample meter earlier, it was the Accu Chek Guide Me. She needs lancets & test strips called in to [facility].

## 2020-11-29 NOTE — Telephone Encounter (Signed)
Sent in

## 2020-12-04 MED ORDER — ACCU-CHEK GUIDE W/DEVICE KIT: PACK | 0 refills | Status: DC

## 2020-12-04 NOTE — Telephone Encounter (Signed)
[facility] pharmacy is calling stating that this pt needs an Accu Chek Guide Meter sent in.

## 2020-12-04 NOTE — Telephone Encounter (Signed)
RX sent

## 2020-12-11 ENCOUNTER — Other Ambulatory Visit: Payer: Self-pay | Admitting: "Endocrinology

## 2021-01-28 ENCOUNTER — Ambulatory Visit: Payer: Medicare HMO | Admitting: "Endocrinology

## 2021-01-30 ENCOUNTER — Other Ambulatory Visit: Payer: Self-pay | Admitting: "Endocrinology

## 2021-03-12 ENCOUNTER — Other Ambulatory Visit: Payer: Self-pay | Admitting: "Endocrinology

## 2021-04-03 ENCOUNTER — Encounter: Payer: Self-pay | Admitting: "Endocrinology

## 2021-04-03 ENCOUNTER — Other Ambulatory Visit: Payer: Self-pay

## 2021-04-03 ENCOUNTER — Ambulatory Visit: Payer: Medicare HMO | Admitting: "Endocrinology

## 2021-04-03 VITALS — BP 96/66 | HR 82 | Ht 68.0 in | Wt 255.0 lb

## 2021-04-03 DIAGNOSIS — I10 Essential (primary) hypertension: Secondary | ICD-10-CM | POA: Diagnosis not present

## 2021-04-03 DIAGNOSIS — E782 Mixed hyperlipidemia: Secondary | ICD-10-CM

## 2021-04-03 DIAGNOSIS — E1165 Type 2 diabetes mellitus with hyperglycemia: Secondary | ICD-10-CM | POA: Diagnosis not present

## 2021-04-03 LAB — POCT GLYCOSYLATED HEMOGLOBIN (HGB A1C): HbA1c, POC (controlled diabetic range): 7.3 % — AB (ref 0.0–7.0)

## 2021-04-03 MED ORDER — LISINOPRIL 5 MG PO TABS: 5.0000 mg | ORAL_TABLET | Freq: Every day | ORAL | 1 refills | Status: DC

## 2021-04-03 NOTE — Patient Instructions (Signed)
                                     Advice for Weight Management  -For most of us the best way to lose weight is by diet management. Generally speaking, diet management means consuming less calories intentionally which over time brings about progressive weight loss.  This can be achieved more effectively by restricting carbohydrate consumption to the minimum possible.  So, it is critically important to know your numbers: how much calorie you are consuming and how much calorie you need. More importantly, our carbohydrates sources should be unprocessed or minimally processed complex starch food items.   Sometimes, it is important to balance nutrition by increasing protein intake (animal or plant source), fruits, and vegetables.  - Whole Food, Plant Predominant Nutrition is highly recommended: Eat Plenty of vegetables, Mushrooms, fruits, Legumes, Whole Grains, Nuts, seeds in lieu of processed meats, processed snacks/pastries red meat, poultry, eggs.  -Sticking to a routine mealtime to eat 3 meals a day and avoiding unnecessary snacks is shown to have a big role in weight control. Under normal circumstances, the only time we lose real weight is when we are hungry, so allow hunger to take place- hunger means no food between meal times, only water.  It is not advisable to starve.   -It is better to avoid simple carbohydrates including: Cakes, Sweet Desserts, Ice Cream, Soda (diet and regular), Sweet Tea, Candies, Chips, Cookies, Store Bought Juices, Alcohol in Excess of  1-2 drinks a day, Lemonade,  Artificial Sweeteners, Doughnuts, Coffee Creamers, "Sugar-free" Products, etc, etc.  This is not a complete list.....    -Consulting with certified diabetes educators is proven to provide you with the most accurate and current information on diet.  Also, you may be  interested in discussing diet options/exchanges , we can schedule a visit with Tina Sampson, RDN, CDE for  individualized nutrition education.  -Exercise: If you are able: 30 -60 minutes a day ,4 days a week, or 150 minutes a week.  The longer the better.  Combine stretch, strength, and aerobic activities.  If you were told in the past that you have high risk for cardiovascular diseases, you may seek evaluation by your heart doctor prior to initiating moderate to intense exercise programs.                                  Additional Care Considerations for Diabetes   -Diabetes  is a chronic disease.  The most important care consideration is regular follow-up with your diabetes care provider with the goal being avoiding or delaying its complications and to take advantage of advances in medications and technology.    - Whole Food, Plant Predominant Nutrition is highly recommended: Eat Plenty of vegetables, Mushrooms, fruits, Legumes, Whole Grains, Nuts, seeds in lieu of processed meats, processed snacks/pastries red meat, poultry, eggs.  -Type 2 diabetes is known to coexist with other important comorbidities such as high blood pressure and high cholesterol.  It is critical to control not only the diabetes but also the high blood pressure and high cholesterol to minimize and delay the risk of complications including coronary artery disease, stroke, amputations, blindness, etc.    - Studies showed that people with diabetes will benefit from a class of medications known as ACE inhibitors and statins.  Unless   there are specific reasons not to be on these medications, the standard of care is to consider getting one from these groups of medications at an optimal doses.  These medications are generally considered safe and proven to help protect the heart and the kidneys.    - People with diabetes are encouraged to initiate and maintain regular follow-up with eye doctors, foot doctors, dentists , and if necessary heart and kidney doctors.     - It is highly recommended that people with diabetes quit smoking or  stay away from smoking, and get yearly  flu vaccine and pneumonia vaccine at least every 5 years.  One other important lifestyle recommendation is to ensure adequate sleep - at least 6-7 hours of uninterrupted sleep at night.  -Exercise: If you are able: 30 -60 minutes a day, 4 days a week, or 150 minutes a week.  The longer the better.  Combine stretch, strength, and aerobic activities.  If you were told in the past that you have high risk for cardiovascular diseases, you may seek evaluation by your heart doctor prior to initiating moderate to intense exercise programs.         

## 2021-04-03 NOTE — Progress Notes (Signed)
04/03/2021, 10:38 AM                Endocrinology follow-up note  Subjective:    Patient ID: Tina Sampson, female    DOB: [birth year].  Tina Sampson is being seen in follow-up in the management of currently uncontrolled symptomatic type 2 diabetes, hyperlipidemia, hypertension. PMD:  Corrington, Kip A, MD.   Past Medical History:  Diagnosis Date   Abdominal pain 10/15/2017   Diabetes mellitus    Diabetes mellitus, type II ([place])    H/O knee surgery    Hyperlipidemia    Past Surgical History:  Procedure Laterality Date   ANKLE SURGERY     CARDIAC CATHETERIZATION     catherization     CERVICAL DISC SURGERY     CESAREAN SECTION     CHOLECYSTECTOMY     KNEE SURGERY     KNEE SURGERY     TUBAL LIGATION     Social History   Socioeconomic History   Marital status: Married    Spouse name: Not on file   Number of children: Not on file   Years of education: Not on file   Highest education level: Not on file  Occupational History   Not on file  Tobacco Use   Smoking status: Never   Smokeless tobacco: Never  Vaping Use   Vaping Use: Never used  Substance and Sexual Activity   Alcohol use: No   Drug use: No   Sexual activity: Yes  Other Topics Concern   Not on file  Social History Narrative   Not on file   Social Determinants of Health   Financial Resource Strain: Not on file  Food Insecurity: Not on file  Transportation Needs: Not on file  Physical Activity: Not on file  Stress: Not on file  Social Connections: Not on file   Outpatient Encounter Medications as of 04/03/2021  Medication Sig   lisinopril (ZESTRIL) 5 MG tablet Take 1 tablet (5 mg total) by mouth daily.   Accu-Chek FastClix Lancets MISC Use as instructed to test blood glucose three times daily. DX E11.65   Alcohol Swabs (B-D SINGLE USE SWABS REGULAR) PADS    ascorbic acid (VITAMIN C) 500 MG tablet Take by mouth.    aspirin EC 81 MG tablet Take 81 mg daily by mouth.   B Complex Vitamins (VITAMIN B COMPLEX PO) Take daily by mouth.   baclofen (LIORESAL) 10 MG tablet TAKE 1 TABLET TWICE DAILY AS NEEDED FOR MUSCLE SPASM   Blood Glucose Monitoring Suppl (ACCU-CHEK GUIDE) w/Device KIT Use to test BG tid. DX e11.65   BYDUREON BCISE 2 MG/0.85ML AUIJ INJECT 2 MG INTO SKIN ONCE A WEEK   diphenhydramine-acetaminophen (TYLENOL PM) 25-500 MG TABS tablet Take 1 tablet by mouth at bedtime as needed.   EPINEPHrine 0.3 mg/0.3 mL IJ SOAJ injection Inject into the muscle.   gabapentin (NEURONTIN) 300 MG capsule Take 900 mg by mouth 2 (two) times daily.    glucose blood (ACCU-CHEK GUIDE) test strip TEST BLOOD SUGAR THREE TIMES DAILY, DX E11.65   hydrOXYzine (ATARAX/VISTARIL) 25 MG tablet Take by mouth.  Multiple Vitamin (MULTIVITAMIN) tablet Take 1 tablet daily by mouth.   omeprazole (PRILOSEC) 20 MG capsule Take 40 mg by mouth daily.    pramipexole (MIRAPEX) 0.125 MG tablet Take 0.125 mg by mouth at bedtime. For RLS   pravastatin (PRAVACHOL) 40 MG tablet Take 40 mg by mouth daily.   propranolol (INDERAL) 10 MG tablet    traZODone (DESYREL) 50 MG tablet Take 50 mg by mouth at bedtime.   [DISCONTINUED] lisinopril-hydrochlorothiazide (ZESTORETIC) 10-12.5 MG tablet TAKE 1 TABLET EVERY DAY   No facility-administered encounter medications on file as of 04/03/2021.    ALLERGIES: Allergies  Allergen Reactions   Bee Venom Shortness Of Breath, Itching and Swelling    VACCINATION STATUS: Immunization History  Administered Date(s) Administered   Influenza Split 05/07/2013   Moderna Sars-Covid-2 Vaccination 10/05/2019, 11/02/2019, 08/28/2020    Diabetes She presents for her follow-up diabetic visit. She has type 2 diabetes mellitus. Onset time: She was diagnosed at approximate age of 60 years. Her disease course has been worsening. There are no hypoglycemic associated symptoms. Pertinent negatives for hypoglycemia include  no confusion, headaches, pallor or seizures. Associated symptoms include weight loss. Pertinent negatives for diabetes include no blurred vision, no chest pain, no polydipsia, no polyphagia and no polyuria. There are no hypoglycemic complications. Symptoms are worsening. There are no diabetic complications. Risk factors for coronary artery disease include dyslipidemia, diabetes mellitus, obesity, sedentary lifestyle and post-menopausal. Current diabetic treatment includes oral agent (monotherapy). Her weight is decreasing steadily (Overall she has achieved 22 pounds of weight loss.). She is following a generally unhealthy diet. When asked about meal planning, she reported none. She has not had a previous visit with a dietitian. She never participates in exercise. Her home blood glucose trend is decreasing steadily. Her breakfast blood glucose range is generally 130-140 mg/dl. (She presents with continued improvement in her glycemic profile.  Her point-of-care A1c is 7.3%.  She did not document any hypoglycemia.   ) An ACE inhibitor/angiotensin II receptor blocker is being taken. She does not see a podiatrist.Eye exam is current (She has not seen her ophthalmologist in 2 years, I urged to reschedule a visit.).  Hyperlipidemia This is a chronic problem. The current episode started more than 1 year ago. The problem is controlled. Exacerbating diseases include diabetes and obesity. Pertinent negatives include no chest pain, myalgias or shortness of breath. Current antihyperlipidemic treatment includes statins. Risk factors for coronary artery disease include diabetes mellitus, dyslipidemia, obesity, a sedentary lifestyle and post-menopausal.    Review of systems  Constitutional: + Progressive weight loss,  current  Body mass index is 38.77 kg/m. , no fatigue, no subjective hyperthermia, no subjective hypothermia   Objective:    BP 96/66   Pulse 82   Ht _0  (1.727 m)   Wt 255 lb (115.7 kg)   LMP  03/19/2011   BMI 38.77 kg/m   Wt Readings from Last 3 Encounters:  04/03/21 255 lb (115.7 kg)  11/05/20 258 lb (117 kg)  10/02/20 261 lb 12.8 oz (118.8 kg)    Physical Exam- Limited  Constitutional:  Body mass index is 38.77 kg/m. , not in acute distress, normal state of mind   Recent Results (from the past 2160 hour(s))  HgB A1c     Status: Abnormal   Collection Time: 04/03/21 10:23 AM  Result Value Ref Range   Hemoglobin A1C     HbA1c POC (<> result, manual entry)     HbA1c, POC (prediabetic range)  HbA1c, POC (controlled diabetic range) 7.3 (A) 0.0 - 7.0 %    Lipid Panel     Component Value Date/Time   CHOL 140 01/13/2020 1112   TRIG 223 (H) 01/13/2020 1112   HDL 38 (L) 01/13/2020 1112   CHOLHDL 3.7 01/13/2020 1112   LDLCALC 71 01/13/2020 1112    Assessment & Plan:   1. Uncontrolled type 2 diabetes mellitus with hyperglycemia ([place])  - Tina Sampson has currently uncontrolled symptomatic type 2 DM since  60 years of age.  She presents with continued improvement in her glycemic profile.  Her point-of-care A1c is 7.3%.  She did not document any hypoglycemia.     Recent labs reviewed.  -her diabetes is complicated by obesity/sedentary life and Tina Sampson remains at a high risk for more acute and chronic complications which include CAD, CVA, CKD, retinopathy, and neuropathy. These are all discussed in detail with the patient.  - I have counseled her on diet management and weight loss, by adopting a carbohydrate restricted/protein rich diet.  - she acknowledges that there is a room for improvement in her food and drink choices. - Suggestion is made for her to avoid simple carbohydrates  from her diet including Cakes, Sweet Desserts, Ice Cream, Soda (diet and regular), Sweet Tea, Candies, Chips, Cookies, Store Bought Juices, Alcohol in Excess of  1-2 drinks a day, Artificial Sweeteners,  Coffee Creamer, and "Sugar-free" Products, Lemonade. This will help  patient to have more stable blood glucose profile and potentially avoid unintended weight gain.  - I encouraged her to switch to  unprocessed or minimally processed complex starch and increased protein intake (animal or plant source), fruits, and vegetables.  - she is advised to stick to a routine mealtimes to eat 3 meals  a day and avoid unnecessary snacks ( to snack only to correct hypoglycemia).   - I have approached her with the following individualized plan to manage diabetes and patient agrees:   -Based on her presentation with adequate control of diabetes with A1c of 7.3%, she will not need insulin treatment for now.  She wishes to stay off of some of her medications.  She is advised to continue Bydureon 2 mg subcutaneously weekly.  She will be reconsidered for low-dose metformin during her next visit if A1c remains above 7%.      2) BP/HTN -Her blood pressure is tightly controlled.  She is advised to take her lisinopril/HCTZ every other day.  After finishing her current supplies, she will be continued on lisinopril 5 mg p.o. daily.     3) Lipids/HPL:   Her fasting lipid panel shows LDL at 71.  She is advised to continue pravastatin 40 mg p.o. nightly.  She will have repeat fasting lipid panel before next visit.      4) weight management: Her BMI is 38.7, after 22 pounds of weight loss.    She is a candidate for  more modest weight loss.  Carbs restricted diet and exercise regimen were discussed with her.  5) Chronic Care/Health Maintenance:  -she  is on  Statin medications and   lisinopril is encouraged to continue to follow up with Ophthalmology, Dentist,  Podiatrist at least yearly or according to recommendations, and advised to  stay away from smoking. I have recommended yearly flu vaccine and pneumonia vaccination at least every 5 years; moderate intensity exercise for up to 150 minutes weekly; and  sleep for at least 7 hours a day.  Her screen ABI  was abnormal in March 2022.   She was evaluated by vascular surgery with better testing showing no evidence of occlusive peripheral arterial disease.    - I advised patient to maintain close follow up with Corrington, Kip A, MD for primary care needs.   I spent 41 minutes in the care of the patient today including review of labs from [place], Lipids, Thyroid Function, Hematology (current and previous including abstractions from other facilities); face-to-face time discussing  her blood glucose readings/logs, discussing hypoglycemia and hyperglycemia episodes and symptoms, medications doses, her options of short and long term treatment based on the latest standards of care / guidelines;  discussion about incorporating lifestyle medicine;  and documenting the encounter.    Please refer to Patient Instructions for Blood Glucose Monitoring and Insulin/Medications Dosing Guide"  in media tab for additional information. Please  also refer to " Patient Self Inventory" in the Media  tab for reviewed elements of pertinent patient history.  [name] participated in the discussions, expressed understanding, and voiced agreement with the above plans.  All questions were answered to her satisfaction. she is encouraged to contact clinic should she have any questions or concerns prior to her return visit.    Follow up plan: - Return in about 6 months (around 10/01/2021) for F/U with Pre-visit Labs, Meter, Logs, A1c here.Glade Lloyd, MD [facility] [address] [place], [place] [address] Phone: [phone]  Fax: [phone]    04/03/2021, 10:38 AM  This note was partially dictated with voice recognition software. Similar sounding words can be transcribed inadequately or may not  be corrected upon review.

## 2021-04-23 ENCOUNTER — Telehealth: Payer: Self-pay

## 2021-04-23 NOTE — Telephone Encounter (Signed)
error 

## 2021-05-06 ENCOUNTER — Other Ambulatory Visit: Payer: Self-pay | Admitting: "Endocrinology

## 2021-07-02 ENCOUNTER — Other Ambulatory Visit: Payer: Self-pay | Admitting: "Endocrinology

## 2021-07-10 LAB — LIPID PANEL
Cholesterol: 132 (ref 0–200)
HDL: 35 (ref 35–70)
LDL Cholesterol: 65
Triglycerides: 188 — AB (ref 40–160)

## 2021-07-10 LAB — HEMOGLOBIN A1C: Hemoglobin A1C: 6.9

## 2021-07-10 LAB — TSH: TSH: 1.7 (ref 0.41–5.90)

## 2021-08-29 ENCOUNTER — Encounter (INDEPENDENT_AMBULATORY_CARE_PROVIDER_SITE_OTHER): Payer: Self-pay | Admitting: *Deleted

## 2021-08-30 ENCOUNTER — Other Ambulatory Visit: Payer: Self-pay

## 2021-08-30 ENCOUNTER — Other Ambulatory Visit: Payer: Self-pay | Admitting: "Endocrinology

## 2021-08-30 ENCOUNTER — Emergency Department (HOSPITAL_COMMUNITY)
Admission: EM | Admit: 2021-08-30 | Discharge: 2021-08-30 | Disposition: A | Discharge status: Home / Self Care | Payer: Medicare HMO | Attending: Emergency Medicine | Admitting: Emergency Medicine

## 2021-08-30 ENCOUNTER — Encounter (HOSPITAL_COMMUNITY): Payer: Self-pay

## 2021-08-30 ENCOUNTER — Emergency Department (HOSPITAL_COMMUNITY): Payer: Medicare HMO

## 2021-08-30 DIAGNOSIS — Y92410 Unspecified street and highway as the place of occurrence of the external cause: Secondary | ICD-10-CM | POA: Diagnosis not present

## 2021-08-30 DIAGNOSIS — Z7982 Long term (current) use of aspirin: Secondary | ICD-10-CM | POA: Diagnosis not present

## 2021-08-30 DIAGNOSIS — S4991XA Unspecified injury of right shoulder and upper arm, initial encounter: Secondary | ICD-10-CM | POA: Insufficient documentation

## 2021-08-30 DIAGNOSIS — S59901A Unspecified injury of right elbow, initial encounter: Secondary | ICD-10-CM | POA: Insufficient documentation

## 2021-08-30 DIAGNOSIS — S199XXA Unspecified injury of neck, initial encounter: Secondary | ICD-10-CM | POA: Insufficient documentation

## 2021-08-30 DIAGNOSIS — E119 Type 2 diabetes mellitus without complications: Secondary | ICD-10-CM | POA: Diagnosis not present

## 2021-08-30 DIAGNOSIS — S0990XA Unspecified injury of head, initial encounter: Secondary | ICD-10-CM | POA: Diagnosis not present

## 2021-08-30 MED ORDER — OXYCODONE-ACETAMINOPHEN 5-325 MG PO TABS
1.0000 | ORAL_TABLET | Freq: Once | ORAL | Status: AC
  Administered 2021-08-30: 1 via ORAL
  Filled 2021-08-30: qty 1
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
## 2021-09-21 ENCOUNTER — Other Ambulatory Visit: Payer: Self-pay | Admitting: "Endocrinology

## 2021-09-25 LAB — COMPREHENSIVE METABOLIC PANEL
ALT: 17 IU/L (ref 0–32)
AST: 17 IU/L (ref 0–40)
Albumin/Globulin Ratio: 1.5 (ref 1.2–2.2)
Albumin: 4.5 g/dL (ref 3.8–4.8)
Alkaline Phosphatase: 127 IU/L — ABNORMAL HIGH (ref 44–121)
BUN/Creatinine Ratio: 18 (ref 12–28)
BUN: 18 mg/dL (ref 8–27)
Bilirubin Total: 0.3 mg/dL (ref 0.0–1.2)
CO2: 22 mmol/L (ref 20–29)
Calcium: 9.8 mg/dL (ref 8.7–10.3)
Chloride: 106 mmol/L (ref 96–106)
Creatinine, Ser: 0.99 mg/dL (ref 0.57–1.00)
Globulin, Total: 3.1 g/dL (ref 1.5–4.5)
Glucose: 134 mg/dL — ABNORMAL HIGH (ref 70–99)
Potassium: 4.1 mmol/L (ref 3.5–5.2)
Sodium: 142 mmol/L (ref 134–144)
Total Protein: 7.6 g/dL (ref 6.0–8.5)
eGFR: 64 mL/min/{1.73_m2} (ref >59)

## 2021-09-25 LAB — LIPID PANEL
Chol/HDL Ratio: 3.3 ratio (ref 0.0–4.4)
Cholesterol, Total: 157 mg/dL (ref 100–199)
HDL: 47 mg/dL (ref >39)
LDL Chol Calc (NIH): 76 mg/dL (ref 0–99)
Triglycerides: 202 mg/dL — ABNORMAL HIGH (ref 0–149)
VLDL Cholesterol Cal: 34 mg/dL (ref 5–40)

## 2021-09-25 LAB — T4, FREE: Free T4: 1.05 ng/dL (ref 0.82–1.77)

## 2021-09-25 LAB — TSH: TSH: 0.625 u[IU]/mL (ref 0.450–4.500)

## 2021-10-02 ENCOUNTER — Other Ambulatory Visit: Payer: Self-pay | Admitting: "Endocrinology

## 2021-10-02 ENCOUNTER — Ambulatory Visit: Payer: Medicare HMO | Admitting: "Endocrinology

## 2021-10-02 ENCOUNTER — Encounter: Payer: Self-pay | Admitting: "Endocrinology

## 2021-10-02 VITALS — BP 109/76 | HR 76 | Ht 68.0 in | Wt 253.6 lb

## 2021-10-02 DIAGNOSIS — I10 Essential (primary) hypertension: Secondary | ICD-10-CM | POA: Diagnosis not present

## 2021-10-02 DIAGNOSIS — E1165 Type 2 diabetes mellitus with hyperglycemia: Secondary | ICD-10-CM | POA: Diagnosis not present

## 2021-10-02 DIAGNOSIS — E782 Mixed hyperlipidemia: Secondary | ICD-10-CM | POA: Diagnosis not present

## 2021-10-02 DIAGNOSIS — Z6838 Body mass index (BMI) 38.0-38.9, adult: Secondary | ICD-10-CM

## 2021-10-02 MED ORDER — BYDUREON BCISE 2 MG/0.85ML ~~LOC~~ AUIJ: AUTO-INJECTOR | SUBCUTANEOUS | 1 refills | Status: DC

## 2021-10-02 NOTE — Progress Notes (Signed)
10/02/2021, 12:48 PM                Endocrinology follow-up note  Subjective:    Patient ID: Tina Sampson, female    DOB: 11/28/1960.  KHALIYA GOLINSKI is being seen in follow-up in the management of currently uncontrolled symptomatic type 2 diabetes, hyperlipidemia, hypertension. PMD:  Marijean Heath, NP.   Past Medical History:  Diagnosis Date   Abdominal pain 10/15/2017   Diabetes mellitus    Diabetes mellitus, type II ([place])    H/O knee surgery    Hyperlipidemia    Past Surgical History:  Procedure Laterality Date   ANKLE SURGERY     CARDIAC CATHETERIZATION     catherization     CERVICAL DISC SURGERY     CESAREAN SECTION     CHOLECYSTECTOMY     KNEE SURGERY     KNEE SURGERY     TUBAL LIGATION     Social History   Socioeconomic History   Marital status: Married    Spouse name: Not on file   Number of children: Not on file   Years of education: Not on file   Highest education level: Not on file  Occupational History   Not on file  Tobacco Use   Smoking status: Never   Smokeless tobacco: Never  Vaping Use   Vaping Use: Never used  Substance and Sexual Activity   Alcohol use: No   Drug use: No   Sexual activity: Yes  Other Topics Concern   Not on file  Social History Narrative   Not on file   Social Determinants of Health   Financial Resource Strain: Not on file  Food Insecurity: Not on file  Transportation Needs: Not on file  Physical Activity: Not on file  Stress: Not on file  Social Connections: Not on file   Outpatient Encounter Medications as of 10/02/2021  Medication Sig   Accu-Chek Softclix Lancets lancets TEST BLOOD SUGAR THREE TIMES DAILY AS DIRECTED   Alcohol Swabs (B-D SINGLE USE SWABS REGULAR) PADS    ascorbic acid (VITAMIN C) 500 MG tablet Take by mouth.   aspirin EC 81 MG tablet Take 81 mg daily by mouth.   B Complex Vitamins (VITAMIN B COMPLEX  PO) Take daily by mouth.   Blood Glucose Monitoring Suppl (ACCU-CHEK GUIDE) w/Device KIT Use to test BG tid. DX e11.65   diphenhydramine-acetaminophen (TYLENOL PM) 25-500 MG TABS tablet Take 1 tablet by mouth at bedtime as needed.   EPINEPHrine 0.3 mg/0.3 mL IJ SOAJ injection Inject into the muscle.   Exenatide ER (BYDUREON BCISE) 2 MG/0.85ML AUIJ INJECT 2 MG INTO SKIN ONCE A WEEK   gabapentin (NEURONTIN) 300 MG capsule Take 900 mg by mouth 2 (two) times daily.    glucose blood (ACCU-CHEK GUIDE) test strip TEST BLOOD SUGAR THREE TIMES DAILY, DX E11.65   hydrOXYzine (ATARAX/VISTARIL) 25 MG tablet Take by mouth.   lisinopril (ZESTRIL) 5 MG tablet Take 1 tablet (5 mg total) by mouth daily.   methocarbamol (ROBAXIN) 500 MG tablet Take 500 mg by mouth 2 (two) times daily.   Multiple Vitamin (MULTIVITAMIN)  tablet Take 1 tablet daily by mouth.   omeprazole (PRILOSEC) 20 MG capsule Take 40 mg by mouth daily.    pramipexole (MIRAPEX) 0.125 MG tablet Take 0.125 mg by mouth at bedtime. For RLS   pravastatin (PRAVACHOL) 40 MG tablet Take 40 mg by mouth daily.   propranolol (INDERAL) 10 MG tablet    traZODone (DESYREL) 50 MG tablet Take 50 mg by mouth at bedtime.   [DISCONTINUED] baclofen (LIORESAL) 10 MG tablet TAKE 1 TABLET TWICE DAILY AS NEEDED FOR MUSCLE SPASM   [DISCONTINUED] BYDUREON BCISE 2 MG/0.85ML AUIJ INJECT 2 MG INTO SKIN ONCE A WEEK   No facility-administered encounter medications on file as of 10/02/2021.    ALLERGIES: Allergies  Allergen Reactions   Bee Venom Shortness Of Breath, Itching and Swelling    VACCINATION STATUS: Immunization History  Administered Date(s) Administered   Influenza Split 05/07/2013   Moderna Sars-Covid-2 Vaccination 10/05/2019, 11/02/2019, 08/28/2020    Diabetes She presents for her follow-up diabetic visit. She has type 2 diabetes mellitus. Onset time: She was diagnosed at approximate age of 36 years. Her disease course has been improving. There are no  hypoglycemic associated symptoms. Pertinent negatives for hypoglycemia include no confusion, headaches, pallor or seizures. Pertinent negatives for diabetes include no blurred vision, no chest pain, no polydipsia, no polyphagia, no polyuria and no weight loss. There are no hypoglycemic complications. Symptoms are improving. There are no diabetic complications. Risk factors for coronary artery disease include dyslipidemia, diabetes mellitus, obesity, sedentary lifestyle and post-menopausal. Current diabetic treatments: She is currently on Bydureon 2 mg subcutaneously weekly. Her weight is decreasing steadily (Overall she has achieved 22 pounds of weight loss.). She is following a generally unhealthy diet. When asked about meal planning, she reported none. She has not had a previous visit with a dietitian. She never participates in exercise. Her home blood glucose trend is decreasing steadily. Her breakfast blood glucose range is generally 140-180 mg/dl. Her overall blood glucose range is 140-180 mg/dl. (She presents with controlled glycemic profile averaging 170 for the last 30 days.  Her previsit labs show A1c of 6.9%, improving from 7.3% during her last visit.  She did not document any hypoglycemia.   ) An ACE inhibitor/angiotensin II receptor blocker is being taken. She does not see a podiatrist.Eye exam is current (She has not seen her ophthalmologist in 2 years, I urged to reschedule a visit.).  Hyperlipidemia This is a chronic problem. The current episode started more than 1 year ago. The problem is controlled. Exacerbating diseases include diabetes and obesity. Pertinent negatives include no chest pain, myalgias or shortness of breath. Current antihyperlipidemic treatment includes statins. Risk factors for coronary artery disease include diabetes mellitus, dyslipidemia, obesity, a sedentary lifestyle and post-menopausal.    Review of systems  Constitutional: + Progressive weight loss,  current  Body  mass index is 38.56 kg/m. , no fatigue, no subjective hyperthermia, no subjective hypothermia   Objective:    BP 109/76    Pulse 76    Ht '5\' 8"'  (1.727 m)    Wt 253 lb 9.6 oz (115 kg)    LMP 03/19/2011    BMI 38.56 kg/m   Wt Readings from Last 3 Encounters:  10/02/21 253 lb 9.6 oz (115 kg)  04/03/21 255 lb (115.7 kg)  11/05/20 258 lb (117 kg)    Physical Exam- Limited  Constitutional:  Body mass index is 38.56 kg/m. , not in acute distress, normal state of mind   Recent Results (from  the past 2160 hour(s))  Lipid panel     Status: Abnormal   Collection Time: 07/10/21 12:00 AM  Result Value Ref Range   Triglycerides 188 (A) 40 - 160   Cholesterol 132 0 - 200   HDL 35 35 - 70   LDL Cholesterol 65   Hemoglobin A1c     Status: None   Collection Time: 07/10/21 12:00 AM  Result Value Ref Range   Hemoglobin A1C 6.9   TSH     Status: None   Collection Time: 07/10/21 12:00 AM  Result Value Ref Range   TSH 1.70 0.41 - 5.90  Comprehensive metabolic panel     Status: Abnormal   Collection Time: 09/24/21  9:42 AM  Result Value Ref Range   Glucose 134 (H) 70 - 99 mg/dL   BUN 18 8 - 27 mg/dL   Creatinine, Ser 0.99 0.57 - 1.00 mg/dL   eGFR 64 >59 mL/min/1.73   BUN/Creatinine Ratio 18 12 - 28   Sodium 142 134 - 144 mmol/L   Potassium 4.1 3.5 - 5.2 mmol/L   Chloride 106 96 - 106 mmol/L   CO2 22 20 - 29 mmol/L   Calcium 9.8 8.7 - 10.3 mg/dL   Total Protein 7.6 6.0 - 8.5 g/dL   Albumin 4.5 3.8 - 4.8 g/dL   Globulin, Total 3.1 1.5 - 4.5 g/dL   Albumin/Globulin Ratio 1.5 1.2 - 2.2   Bilirubin Total 0.3 0.0 - 1.2 mg/dL   Alkaline Phosphatase 127 (H) 44 - 121 IU/L   AST 17 0 - 40 IU/L   ALT 17 0 - 32 IU/L  Lipid panel     Status: Abnormal   Collection Time: 09/24/21  9:42 AM  Result Value Ref Range   Cholesterol, Total 157 100 - 199 mg/dL   Triglycerides 202 (H) 0 - 149 mg/dL   HDL 47 >39 mg/dL   VLDL Cholesterol Cal 34 5 - 40 mg/dL   LDL Chol Calc (NIH) 76 0 - 99 mg/dL    Chol/HDL Ratio 3.3 0.0 - 4.4 ratio    Comment:                                   T. Chol/HDL Ratio                                             Men  Women                               1/2 Avg.Risk  3.4    3.3                                   Avg.Risk  5.0    4.4                                2X Avg.Risk  9.6    7.1                                3X Avg.Risk 23.4   11.0  TSH     Status: None   Collection Time: 09/24/21  9:42 AM  Result Value Ref Range   TSH 0.625 0.450 - 4.500 uIU/mL  T4, free     Status: None   Collection Time: 09/24/21  9:42 AM  Result Value Ref Range   Free T4 1.05 0.82 - 1.77 ng/dL    Lipid Panel     Component Value Date/Time   CHOL 157 09/24/2021 0942   TRIG 202 (H) 09/24/2021 0942   HDL 47 09/24/2021 0942   CHOLHDL 3.3 09/24/2021 0942   CHOLHDL 3.7 01/13/2020 1112   LDLCALC 76 09/24/2021 0942   LDLCALC 71 01/13/2020 1112    Assessment & Plan:   1. Uncontrolled type 2 diabetes mellitus with hyperglycemia ([place])  - Lilianna L Arai has currently uncontrolled symptomatic type 2 DM since  61 years of age.  She presents with controlled glycemic profile averaging 170 for the last 30 days.  Her previsit labs show A1c of 6.9%, improving from 7.3% during her last visit.  She did not document any hypoglycemia.    Recent labs reviewed.  -her diabetes is complicated by obesity/sedentary life and GERIANNE SIMONET remains at a high risk for more acute and chronic complications which include CAD, CVA, CKD, retinopathy, and neuropathy. These are all discussed in detail with the patient.  - I have counseled her on diet management and weight loss, by adopting a carbohydrate restricted/protein rich diet. - she acknowledges that there is a room for improvement in her food and drink choices. - Suggestion is made for her to avoid simple carbohydrates  from her diet including Cakes, Sweet Desserts, Ice Cream, Soda (diet and regular), Sweet Tea, Candies, Chips, Cookies,  Store Bought Juices, Alcohol in Excess of  1-2 drinks a day, Artificial Sweeteners,  Coffee Creamer, and "Sugar-free" Products, Lemonade. This will help patient to have more stable blood glucose profile and potentially avoid unintended weight gain.  - I encouraged her to switch to  unprocessed or minimally processed complex starch and increased protein intake (animal or plant source), fruits, and vegetables.  - she is advised to stick to a routine mealtimes to eat 3 meals  a day and avoid unnecessary snacks ( to snack only to correct hypoglycemia).   - I have approached her with the following individualized plan to manage diabetes and patient agrees:   -In light of her presentation with controlled glycemic profile and A1c of 6.9% , she will not need insulin treatment for now.  She is responding to the weekly injection of Bydureon.  She is advised to continue Bydureon 2 mg subcutaneously weekly.  She is advised and willing to continue monitoring blood glucose at least once a day before breakfast.    She will be reconsidered for low-dose metformin during her next visit if A1c increases to above 7%.        2) BP/HTN -Her blood pressure is controlled to target.  She is advised to take her lisinopril/HCTZ every other day.  After finishing her current supplies, she will be continued on lisinopril 5 mg p.o. daily.     3) Lipids/HPL:   Her most recent lipid panel showed controlled LDL at 65.  She is advised to continue pravastatin 40 mg p.o. nightly.  Side effects and precautions discussed with her.    4) weight management: Her BMI is 38.56, no further success after she lost 20 pounds prior to her last visit.    She is a candidate  for  more modest weight loss.  Carbs restricted diet and exercise regimen were discussed with her.  Whole food plant-based nutrition is discussed with her.  5) Chronic Care/Health Maintenance:  -she  is on  Statin medications and   lisinopril is encouraged to continue to  follow up with Ophthalmology, Dentist,  Podiatrist at least yearly or according to recommendations, and advised to  stay away from smoking. I have recommended yearly flu vaccine and pneumonia vaccination at least every 5 years; moderate intensity exercise for up to 150 minutes weekly; and  sleep for at least 7 hours a day.  Her screen ABI was abnormal in March 2022.  She was evaluated by vascular surgery with better testing showing no evidence of occlusive peripheral arterial disease.    - I advised patient to maintain close follow up with Marijean Heath, NP for primary care needs.   I spent 41 minutes in the care of the patient today including review of labs from [facility], Lipids, Thyroid Function, Hematology (current and previous including abstractions from other facilities); face-to-face time discussing  her blood glucose readings/logs, discussing hypoglycemia and hyperglycemia episodes and symptoms, medications doses, her options of short and long term treatment based on the latest standards of care / guidelines;  discussion about incorporating lifestyle medicine;  and documenting the encounter.    Please refer to Patient Instructions for Blood Glucose Monitoring and Insulin/Medications Dosing Guide"  in media tab for additional information. Please  also refer to " Patient Self Inventory" in the Media  tab for reviewed elements of pertinent patient history.  [name] participated in the discussions, expressed understanding, and voiced agreement with the above plans.  All questions were answered to her satisfaction. she is encouraged to contact clinic should she have any questions or concerns prior to her return visit.     Follow up plan: - Return in about 3 months (around 01/02/2022) for Bring Meter and Logs- A1c in Office.  Glade Lloyd, MD [facility] [address] [place], [place] [address] Phone: [phone]  Fax:  [phone]    10/02/2021, 12:48 PM  This note was partially dictated with voice recognition software. Similar sounding words can be transcribed inadequately or may not  be corrected upon review.

## 2021-10-02 NOTE — Patient Instructions (Signed)

## 2021-11-04 ENCOUNTER — Ambulatory Visit (INDEPENDENT_AMBULATORY_CARE_PROVIDER_SITE_OTHER): Payer: Medicare HMO | Admitting: Gastroenterology

## 2021-11-04 ENCOUNTER — Other Ambulatory Visit (INDEPENDENT_AMBULATORY_CARE_PROVIDER_SITE_OTHER): Payer: Self-pay

## 2021-11-04 ENCOUNTER — Encounter (INDEPENDENT_AMBULATORY_CARE_PROVIDER_SITE_OTHER): Payer: Self-pay

## 2021-11-04 ENCOUNTER — Telehealth (INDEPENDENT_AMBULATORY_CARE_PROVIDER_SITE_OTHER): Payer: Self-pay

## 2021-11-04 ENCOUNTER — Encounter (INDEPENDENT_AMBULATORY_CARE_PROVIDER_SITE_OTHER): Payer: Self-pay | Admitting: Gastroenterology

## 2021-11-04 VITALS — BP 104/72 | HR 75 | Temp 98.4°F | Ht 68.0 in | Wt 251.7 lb

## 2021-11-04 DIAGNOSIS — D126 Benign neoplasm of colon, unspecified: Secondary | ICD-10-CM | POA: Diagnosis not present

## 2021-11-04 DIAGNOSIS — R1031 Right lower quadrant pain: Secondary | ICD-10-CM

## 2021-11-04 DIAGNOSIS — G8929 Other chronic pain: Secondary | ICD-10-CM | POA: Diagnosis not present

## 2021-11-04 DIAGNOSIS — R1310 Dysphagia, unspecified: Secondary | ICD-10-CM

## 2021-11-04 MED ORDER — DICYCLOMINE HCL 10 MG PO CAPS: 10.0000 mg | ORAL_CAPSULE | Freq: Three times a day (TID) | ORAL | 3 refills | Status: DC | PRN

## 2021-11-04 MED ORDER — PEG 3350-KCL-NA BICARB-NACL 420 G PO SOLR: 4000.0000 mL | ORAL | 0 refills | Status: DC

## 2021-11-04 NOTE — Progress Notes (Signed)
? ?Referring Provider: Marijean Heath, * ?Primary Care Physician:  Marijean Heath, NP ?Primary GI Physician: new ? ?Chief Complaint  ?Patient presents with  ? Abdominal Pain  ?  Patient having right lower quad pain for about one year. Comes and goes. Has been to [facility] ED with the pain about 6 - 8 months ago.   ? ?HPI:   ?Tina Sampson is a 61 y.o. female with past medical history of DM type 2, HLD.  ? ?Patient presenting today as a new patient for RLQ pain.  ? ?Seen at [facility] ED on 08/24/21, CT pelvis w/o contrast then with colonic diverticulosis. Labs unremarkable other than slightly elevated Alk Phos of 134 (down to 127 on repeat 09/24/21). UA with 54m/dl urobilinogen. Notably, patient was seen here in 2019 with c/o RLQ without etiological findings at that time. Previous imaging of liver march 2019 with fatty infiltration of liver.  ? ?She states that she has had chronic RLQ  pain that radiates across lower abdomen, has been present at least since 2019. Pain comes and goes. Pain is worse at times, especially if she is sitting for long periods of time or doing a lot of lifting.  Denies constipation or diarrhea. Denies rectal bleeding or melena. Denies any changes in appetite or unintentional weight loss. Does not feel that pain improves with BMs, has BM 1-2x/day. Pain sometimes improved with advil dual action. States that she has to avoid certain foods like salads, sometimes she tolerates these well and other times she may have diarrhea. She states that she has been under more stress recently and feels that RLQ pain has increased some with that. Denies rectal bleeding, melena.  ? ?She reports occasional issues with foods and sometimes larger pills feeling stuck just above sternal notch. Usually can drink more liquids or relax in her chair and substance will pass, has not had an instances where she needed to cough food/pills back up. She is on omeprazole 220mBID, feels that GERD Is well controlled  on this, has heartburn maybe once per week. Denies any odynophagia, changes in appetite, early satiety, bloating, weight loss, nausea or vomiting.  ? ?NSAID use: advil dual action, takes as needed for RLQ pain ?Social hx: no etoh or tobacco ?Fam hx: mother had colon surgery, unsure if she had colon cancer ? ?[facility] ?Last Endoscopy:never ? ?Recommendations:  ? ? ?Past Medical History:  ?Diagnosis Date  ? Abdominal pain 10/15/2017  ? Diabetes mellitus   ? [place]  ? H/O knee surgery   ? Hyperlipidemia   ? ? ?Past Surgical History:  ?Procedure Laterality Date  ? ANKLE SURGERY    ? CARDIAC CATHETERIZATION    ? catherization    ? CERVICAL DISC SURGERY    ? CESAREAN SECTION    ? CHOLECYSTECTOMY    ? KNEE SURGERY    ? KNEE SURGERY    ? TUBAL LIGATION    ? ? ?Current Outpatient Medications  ?Medication Sig Dispense Refill  ? Accu-Chek Softclix Lancets lancets TEST BLOOD SUGAR THREE TIMES DAILY AS DIRECTED 200 each 2  ? Alcohol Swabs (B-D SINGLE USE SWABS REGULAR) PADS     ? ascorbic acid (VITAMIN C) 500 MG tablet Take by mouth.    ? aspirin EC 81 MG tablet Take 81 mg daily by mouth.    ? B Complex Vitamins (VITAMIN B COMPLEX PO) Take daily by  mouth.    ? Blood Glucose Monitoring Suppl (ACCU-CHEK GUIDE) w/Device KIT Use to test BG tid. DX e11.65 1 kit 0  ? diphenhydramine-acetaminophen (TYLENOL PM) 25-500 MG TABS tablet Take 1 tablet by mouth at bedtime as needed.    ? EPINEPHrine 0.3 mg/0.3 mL IJ SOAJ injection Inject into the muscle.    ? Exenatide ER (BYDUREON BCISE) 2 MG/0.85ML AUIJ INJECT 2 MG INTO SKIN ONCE A WEEK 3.4 mL 1  ? gabapentin (NEURONTIN) 300 MG capsule Take 900 mg by mouth 2 (two) times daily.     ? glucose blood (ACCU-CHEK GUIDE) test strip TEST BLOOD SUGAR THREE TIMES DAILY, DX E11.65 300 each 3  ? hydrOXYzine (ATARAX/VISTARIL) 25 MG tablet Take by mouth.    ? lisinopril (ZESTRIL) 5 MG tablet TAKE ONE TABLET  ONCE DAILY 90 tablet 0  ? methocarbamol (ROBAXIN) 500 MG tablet Take 500 mg by mouth 2 (two) times daily.    ? Multiple Vitamin (MULTIVITAMIN) tablet Take 1 tablet daily by mouth.    ? omeprazole (PRILOSEC) 20 MG capsule Take 40 mg by mouth daily.     ? pramipexole (MIRAPEX) 0.125 MG tablet Take 0.125 mg by mouth at bedtime. One tid For RLS    ? pravastatin (PRAVACHOL) 40 MG tablet Take 40 mg by mouth daily.    ? propranolol (INDERAL) 10 MG tablet 10 mg 2 (two) times daily.    ? traZODone (DESYREL) 50 MG tablet Take 50 mg by mouth at bedtime.    ? topiramate (TOPAMAX) 25 MG tablet Take 25 mg by mouth 2 (two) times daily.    ? ?No current facility-administered medications for this visit.  ? ? ?Allergies as of 11/04/2021 - Review Complete 11/04/2021  ?Allergen Reaction Noted  ? Bee venom Shortness Of Breath, Itching, and Swelling 05/15/2011  ? ? ?Family History  ?Problem Relation Age of Onset  ? Cancer Mother   ? Heart failure Mother   ? Hyperlipidemia Mother   ? Diabetes Mother   ? Osteoarthritis Sister   ? Heart failure Brother   ? ? ?Social History  ? ?Socioeconomic History  ? Marital status: Married  ?  Spouse name: Not on file  ? Number of children: Not on file  ? Years of education: Not on file  ? Highest education level: Not on file  ?Occupational History  ? Not on file  ?Tobacco Use  ? Smoking status: Never  ?  Passive exposure: Current  ? Smokeless tobacco: Never  ?Vaping Use  ? Vaping Use: Never used  ?Substance and Sexual Activity  ? Alcohol use: No  ? Drug use: No  ? Sexual activity: Yes  ?Other Topics Concern  ? Not on file  ?Social History Narrative  ? Not on file  ? ?Social Determinants of Health  ? ?Financial Resource Strain: Not on file  ?Food Insecurity: Not on file  ?Transportation Needs: Not on file  ?Physical Activity: Not on file  ?Stress: Not on file  ?Social Connections: Not on file  ? ?Review of systems ?General: negative for malaise, night sweats, fever, chills, weight loss ?Neck: Negative  for lumps, goiter, pain and significant neck swelling ?Resp: Negative for cough, wheezing, dyspnea at rest ?CV: Negative for chest pain, leg swelling, palpitations, orthopnea ?GI: denies melena, hematochezia, nausea, vomiting, diarrhea, constipation, odynophagia, early satiety or unintentional weight loss. +RLQ pain +occasional dysphagia ?MSK: Negative for joint pain or swelling, back pain, and muscle pain. ?Derm: Negative for itching or rash ?Psych: Denies depression,  anxiety, memory loss, confusion. No homicidal or suicidal ideation.  ?Heme: Negative for prolonged bleeding, bruising easily, and swollen nodes. ?Endocrine: Negative for cold or heat intolerance, polyuria, polydipsia and goiter. ?Neuro: negative for tremor, gait imbalance, syncope and seizures. ?The remainder of the review of systems is noncontributory. ? ?Physical Exam: ?BP 104/72 (BP Location: Left Arm, Patient Position: Sitting, Cuff Size: Large)   Pulse 75   Temp 98.4 ?F (36.9 ?C) (Oral)   Ht '5\' 8"'  (1.727 m)   Wt 251 lb 11.2 oz (114.2 kg)   LMP 03/19/2011   BMI 38.27 kg/m?  ?General:   Alert and oriented. No distress noted. Pleasant and cooperative.  ?Head:  Normocephalic and atraumatic. ?Eyes:  Conjuctiva clear without scleral icterus. ?Mouth:  Oral mucosa pink and moist. Good dentition. No lesions. ?Heart: Normal rate and rhythm, s1 and s2 heart sounds present.  ?Lungs: Clear lung sounds in all lobes. Respirations equal and unlabored. ?Abdomen:  +BS, soft, non-tender and non-distended. No rebound or guarding. No HSM or masses noted. ?Derm: No palmar erythema or jaundice ?Msk:  Symmetrical without gross deformities. Normal posture. ?Extremities:  Without edema. ?Neurologic:  Alert and  oriented x4 ?Psych:  Alert and cooperative. Normal mood and affect. ? ?Invalid input(s): 6 MONTHS  ? ?ASSESSMENT: ?JOELLY BOLANOS is a 61 y.o. female presenting today as a new patient for RLQ pain. ? ?Right lower quadrant Pain has been on and off for a few  years, though worse recently, she does endorse being under more stress. She denies constipation or diarrhea. No rectal bleeding, melena or weight loss. Recent CT of pelvis with colonic diverticulosis but no ev

## 2021-11-04 NOTE — H&P (View-Only) (Signed)
? ?Referring Provider: Marijean Heath, * ?Primary Care Physician:  Marijean Heath, NP ?Primary GI Physician: new ? ?Chief Complaint  ?Patient presents with  ? Abdominal Pain  ?  Patient having right lower quad pain for about one year. Comes and goes. Has been to [facility] ED with the pain about 6 - 8 months ago.   ? ?HPI:   ?Tina Sampson is a 61 y.o. female with past medical history of DM type 2, HLD.  ? ?Patient presenting today as a new patient for RLQ pain.  ? ?Seen at [facility] ED on 08/24/21, CT pelvis w/o contrast then with colonic diverticulosis. Labs unremarkable other than slightly elevated Alk Phos of 134 (down to 127 on repeat 09/24/21). UA with 49m/dl urobilinogen. Notably, patient was seen here in 2019 with c/o RLQ without etiological findings at that time. Previous imaging of liver march 2019 with fatty infiltration of liver.  ? ?She states that she has had chronic RLQ  pain that radiates across lower abdomen, has been present at least since 2019. Pain comes and goes. Pain is worse at times, especially if she is sitting for long periods of time or doing a lot of lifting.  Denies constipation or diarrhea. Denies rectal bleeding or melena. Denies any changes in appetite or unintentional weight loss. Does not feel that pain improves with BMs, has BM 1-2x/day. Pain sometimes improved with advil dual action. States that she has to avoid certain foods like salads, sometimes she tolerates these well and other times she may have diarrhea. She states that she has been under more stress recently and feels that RLQ pain has increased some with that. Denies rectal bleeding, melena.  ? ?She reports occasional issues with foods and sometimes larger pills feeling stuck just above sternal notch. Usually can drink more liquids or relax in her chair and substance will pass, has not had an instances where she needed to cough food/pills back up. She is on omeprazole 270mBID, feels that GERD Is well controlled  on this, has heartburn maybe once per week. Denies any odynophagia, changes in appetite, early satiety, bloating, weight loss, nausea or vomiting.  ? ?NSAID use: advil dual action, takes as needed for RLQ pain ?Social hx: no etoh or tobacco ?Fam hx: mother had colon surgery, unsure if she had colon cancer ? ?[facility]-tubular [facility] ?Last Endoscopy:never ? ?Recommendations:  ? ? ?Past Medical History:  ?Diagnosis Date  ? Abdominal pain 10/15/2017  ? Diabetes mellitus   ? Diabetes mellitus, type II (HCMono  ? H/O knee surgery   ? Hyperlipidemia   ? ? ?Past Surgical History:  ?Procedure Laterality Date  ? ANKLE SURGERY    ? CARDIAC CATHETERIZATION    ? catherization    ? CERVICAL DISC SURGERY    ? CESAREAN SECTION    ? CHOLECYSTECTOMY    ? KNEE SURGERY    ? KNEE SURGERY    ? TUBAL LIGATION    ? ? ?Current Outpatient Medications  ?Medication Sig Dispense Refill  ? Accu-Chek Softclix Lancets lancets TEST BLOOD SUGAR THREE TIMES DAILY AS DIRECTED 200 each 2  ? Alcohol Swabs (B-D SINGLE USE SWABS REGULAR) PADS     ? ascorbic acid (VITAMIN C) 500 MG tablet Take by mouth.    ? aspirin EC 81 MG tablet Take 81 mg daily by mouth.    ? B Complex Vitamins (VITAMIN B COMPLEX PO) Take daily by  mouth.    ? Blood Glucose Monitoring Suppl (ACCU-CHEK GUIDE) w/Device KIT Use to test BG tid. DX e11.65 1 kit 0  ? diphenhydramine-acetaminophen (TYLENOL PM) 25-500 MG TABS tablet Take 1 tablet by mouth at bedtime as needed.    ? EPINEPHrine 0.3 mg/0.3 mL IJ SOAJ injection Inject into the muscle.    ? Exenatide ER (BYDUREON BCISE) 2 MG/0.85ML AUIJ INJECT 2 MG INTO SKIN ONCE A WEEK 3.4 mL 1  ? gabapentin (NEURONTIN) 300 MG capsule Take 900 mg by mouth 2 (two) times daily.     ? glucose blood (ACCU-CHEK GUIDE) test strip TEST BLOOD SUGAR THREE TIMES DAILY, DX E11.65 300 each 3  ? hydrOXYzine (ATARAX/VISTARIL) 25 MG tablet Take by mouth.    ? lisinopril (ZESTRIL) 5 MG tablet TAKE ONE TABLET  ONCE DAILY 90 tablet 0  ? methocarbamol (ROBAXIN) 500 MG tablet Take 500 mg by mouth 2 (two) times daily.    ? Multiple Vitamin (MULTIVITAMIN) tablet Take 1 tablet daily by mouth.    ? omeprazole (PRILOSEC) 20 MG capsule Take 40 mg by mouth daily.     ? pramipexole (MIRAPEX) 0.125 MG tablet Take 0.125 mg by mouth at bedtime. One tid For RLS    ? pravastatin (PRAVACHOL) 40 MG tablet Take 40 mg by mouth daily.    ? propranolol (INDERAL) 10 MG tablet 10 mg 2 (two) times daily.    ? traZODone (DESYREL) 50 MG tablet Take 50 mg by mouth at bedtime.    ? topiramate (TOPAMAX) 25 MG tablet Take 25 mg by mouth 2 (two) times daily.    ? ?No current facility-administered medications for this visit.  ? ? ?Allergies as of 11/04/2021 - Review Complete 11/04/2021  ?Allergen Reaction Noted  ? Bee venom Shortness Of Breath, Itching, and Swelling 05/15/2011  ? ? ?Family History  ?Problem Relation Age of Onset  ? Cancer Mother   ? Heart failure Mother   ? Hyperlipidemia Mother   ? Diabetes Mother   ? Osteoarthritis Sister   ? Heart failure Brother   ? ? ?Social History  ? ?Socioeconomic History  ? Marital status: Married  ?  Spouse name: Not on file  ? Number of children: Not on file  ? Years of education: Not on file  ? Highest education level: Not on file  ?Occupational History  ? Not on file  ?Tobacco Use  ? Smoking status: Never  ?  Passive exposure: Current  ? Smokeless tobacco: Never  ?Vaping Use  ? Vaping Use: Never used  ?Substance and Sexual Activity  ? Alcohol use: No  ? Drug use: No  ? Sexual activity: Yes  ?Other Topics Concern  ? Not on file  ?Social History Narrative  ? Not on file  ? ?Social Determinants of Health  ? ?Financial Resource Strain: Not on file  ?Food Insecurity: Not on file  ?Transportation Needs: Not on file  ?Physical Activity: Not on file  ?Stress: Not on file  ?Social Connections: Not on file  ? ?Review of systems ?General: negative for malaise, night sweats, fever, chills, weight loss ?Neck: Negative  for lumps, goiter, pain and significant neck swelling ?Resp: Negative for cough, wheezing, dyspnea at rest ?CV: Negative for chest pain, leg swelling, palpitations, orthopnea ?GI: denies melena, hematochezia, nausea, vomiting, diarrhea, constipation, odynophagia, early satiety or unintentional weight loss. +RLQ pain +occasional dysphagia ?MSK: Negative for joint pain or swelling, back pain, and muscle pain. ?Derm: Negative for itching or rash ?Psych: Denies depression,  anxiety, memory loss, confusion. No homicidal or suicidal ideation.  ?Heme: Negative for prolonged bleeding, bruising easily, and swollen nodes. ?Endocrine: Negative for cold or heat intolerance, polyuria, polydipsia and goiter. ?Neuro: negative for tremor, gait imbalance, syncope and seizures. ?The remainder of the review of systems is noncontributory. ? ?Physical Exam: ?BP 104/72 (BP Location: Left Arm, Patient Position: Sitting, Cuff Size: Large)   Pulse 75   Temp 98.4 ?F (36.9 ?C) (Oral)   Ht '5\' 8"'  (1.727 m)   Wt 251 lb 11.2 oz (114.2 kg)   LMP 03/19/2011   BMI 38.27 kg/m?  ?General:   Alert and oriented. No distress noted. Pleasant and cooperative.  ?Head:  Normocephalic and atraumatic. ?Eyes:  Conjuctiva clear without scleral icterus. ?Mouth:  Oral mucosa pink and moist. Good dentition. No lesions. ?Heart: Normal rate and rhythm, s1 and s2 heart sounds present.  ?Lungs: Clear lung sounds in all lobes. Respirations equal and unlabored. ?Abdomen:  +BS, soft, non-tender and non-distended. No rebound or guarding. No HSM or masses noted. ?Derm: No palmar erythema or jaundice ?Msk:  Symmetrical without gross deformities. Normal posture. ?Extremities:  Without edema. ?Neurologic:  Alert and  oriented x4 ?Psych:  Alert and cooperative. Normal mood and affect. ? ?Invalid input(s): 6 MONTHS  ? ?ASSESSMENT: ?Tina Sampson is a 61 y.o. female presenting today as a new patient for RLQ pain. ? ?Right lower quadrant Pain has been on and off for a few  years, though worse recently, she does endorse being under more stress. She denies constipation or diarrhea. No rectal bleeding, melena or weight loss. Recent CT of pelvis with colonic diverticulosis but no ev

## 2021-11-04 NOTE — Patient Instructions (Signed)
We will get you set up for colonoscopy for further evaluation of your lower abdominal pain ?I am providing the low FODMAP diet for you to look at, as we discussed it can be helpful to keep a food journal for a few weeks to see if you note any correlation between symptoms and foods you have eaten ?I am also sending dicyclomine '10mg'$  tablets for you to take up to three times a day as needed for abdominal pain ?If you have any new or worsening symptoms, please make me aware ? ?Follow up 3 months ?

## 2021-11-04 NOTE — Telephone Encounter (Signed)
Tina Sampson Tina Sampson, CMA  ?

## 2021-11-05 DIAGNOSIS — R1310 Dysphagia, unspecified: Secondary | ICD-10-CM | POA: Insufficient documentation

## 2021-11-20 ENCOUNTER — Ambulatory Visit (HOSPITAL_COMMUNITY)
Admission: RE | Admit: 2021-11-20 | Discharge: 2021-11-20 | Disposition: A | Discharge status: Home / Self Care | Payer: Medicare HMO | Attending: Gastroenterology | Admitting: Gastroenterology

## 2021-11-20 ENCOUNTER — Encounter (HOSPITAL_COMMUNITY): Payer: Self-pay | Admitting: Gastroenterology

## 2021-11-20 ENCOUNTER — Ambulatory Visit (HOSPITAL_BASED_OUTPATIENT_CLINIC_OR_DEPARTMENT_OTHER): Payer: Medicare HMO | Admitting: Certified Registered"

## 2021-11-20 ENCOUNTER — Ambulatory Visit (HOSPITAL_COMMUNITY): Payer: Medicare HMO | Admitting: Certified Registered"

## 2021-11-20 ENCOUNTER — Encounter (INDEPENDENT_AMBULATORY_CARE_PROVIDER_SITE_OTHER): Payer: Self-pay | Admitting: *Deleted

## 2021-11-20 ENCOUNTER — Other Ambulatory Visit: Payer: Self-pay

## 2021-11-20 ENCOUNTER — Encounter (HOSPITAL_COMMUNITY)
Admission: RE | Disposition: A | Discharge status: Home / Self Care | Payer: Self-pay | Source: Home / Self Care | Attending: Gastroenterology

## 2021-11-20 DIAGNOSIS — D126 Benign neoplasm of colon, unspecified: Secondary | ICD-10-CM

## 2021-11-20 DIAGNOSIS — E785 Hyperlipidemia, unspecified: Secondary | ICD-10-CM | POA: Insufficient documentation

## 2021-11-20 DIAGNOSIS — E669 Obesity, unspecified: Secondary | ICD-10-CM | POA: Diagnosis not present

## 2021-11-20 DIAGNOSIS — K760 Fatty (change of) liver, not elsewhere classified: Secondary | ICD-10-CM | POA: Diagnosis not present

## 2021-11-20 DIAGNOSIS — Z79899 Other long term (current) drug therapy: Secondary | ICD-10-CM | POA: Diagnosis not present

## 2021-11-20 DIAGNOSIS — K635 Polyp of colon: Secondary | ICD-10-CM

## 2021-11-20 DIAGNOSIS — K5730 Diverticulosis of large intestine without perforation or abscess without bleeding: Secondary | ICD-10-CM

## 2021-11-20 DIAGNOSIS — E1165 Type 2 diabetes mellitus with hyperglycemia: Secondary | ICD-10-CM

## 2021-11-20 DIAGNOSIS — D122 Benign neoplasm of ascending colon: Secondary | ICD-10-CM | POA: Diagnosis not present

## 2021-11-20 DIAGNOSIS — I10 Essential (primary) hypertension: Secondary | ICD-10-CM | POA: Insufficient documentation

## 2021-11-20 DIAGNOSIS — G8929 Other chronic pain: Secondary | ICD-10-CM

## 2021-11-20 DIAGNOSIS — E119 Type 2 diabetes mellitus without complications: Secondary | ICD-10-CM | POA: Diagnosis not present

## 2021-11-20 DIAGNOSIS — D124 Benign neoplasm of descending colon: Secondary | ICD-10-CM | POA: Insufficient documentation

## 2021-11-20 DIAGNOSIS — Z6838 Body mass index (BMI) 38.0-38.9, adult: Secondary | ICD-10-CM | POA: Insufficient documentation

## 2021-11-20 DIAGNOSIS — Z6837 Body mass index (BMI) 37.0-37.9, adult: Secondary | ICD-10-CM

## 2021-11-20 DIAGNOSIS — K219 Gastro-esophageal reflux disease without esophagitis: Secondary | ICD-10-CM | POA: Insufficient documentation

## 2021-11-20 DIAGNOSIS — Z794 Long term (current) use of insulin: Secondary | ICD-10-CM

## 2021-11-20 DIAGNOSIS — R1032 Left lower quadrant pain: Secondary | ICD-10-CM

## 2021-11-20 DIAGNOSIS — D123 Benign neoplasm of transverse colon: Secondary | ICD-10-CM | POA: Diagnosis not present

## 2021-11-20 HISTORY — PX: COLONOSCOPY WITH PROPOFOL: SHX5780

## 2021-11-20 HISTORY — PX: POLYPECTOMY: SHX5525

## 2021-11-20 LAB — GLUCOSE, CAPILLARY: Glucose-Capillary: 135 mg/dL — ABNORMAL HIGH (ref 70–99)

## 2021-11-20 LAB — HM COLONOSCOPY

## 2021-11-20 SURGERY — COLONOSCOPY WITH PROPOFOL
Anesthesia: General

## 2021-11-20 MED ORDER — PROPOFOL 10 MG/ML IV BOLUS
INTRAVENOUS | Status: DC | PRN
  Administered 2021-11-20 (×2): 50 mg via INTRAVENOUS
  Administered 2021-11-20: 100 mg via INTRAVENOUS

## 2021-11-20 MED ORDER — LACTATED RINGERS IV SOLN
INTRAVENOUS | Status: DC
  Administered 2021-11-20: 1000 mL via INTRAVENOUS

## 2021-11-20 MED ORDER — PHENYLEPHRINE 80 MCG/ML (10ML) SYRINGE FOR IV PUSH (FOR BLOOD PRESSURE SUPPORT)
PREFILLED_SYRINGE | INTRAVENOUS | Status: AC
  Filled 2021-11-20: qty 10

## 2021-11-20 MED ORDER — LIDOCAINE HCL (CARDIAC) PF 100 MG/5ML IV SOSY
PREFILLED_SYRINGE | INTRAVENOUS | Status: DC | PRN
Start: 2021-11-20 — End: 2021-11-20
  Administered 2021-11-20: 50 mg via INTRAVENOUS

## 2021-11-20 MED ORDER — PHENYLEPHRINE 80 MCG/ML (10ML) SYRINGE FOR IV PUSH (FOR BLOOD PRESSURE SUPPORT)
PREFILLED_SYRINGE | INTRAVENOUS | Status: DC | PRN
  Administered 2021-11-20 (×5): 80 ug via INTRAVENOUS

## 2021-11-20 MED ORDER — PROPOFOL 500 MG/50ML IV EMUL
INTRAVENOUS | Status: DC | PRN
  Administered 2021-11-20: 100 ug/kg/min via INTRAVENOUS

## 2021-11-20 NOTE — Anesthesia Preprocedure Evaluation (Signed)
Anesthesia Evaluation  ?Patient identified by MRN, date of birth, ID band ?Patient awake ? ? ? ?Reviewed: ?Allergy & Precautions, H&P , NPO status , Patient's Chart, lab work & pertinent test results, reviewed documented beta blocker date and time  ? ?Airway ?Mallampati: II ? ?TM Distance: >3 FB ?Neck ROM: full ? ? ? Dental ?no notable dental hx. ? ?  ?Pulmonary ?neg pulmonary ROS,  ?  ?Pulmonary exam normal ?breath sounds clear to auscultation ? ? ? ? ? ? Cardiovascular ?Exercise Tolerance: Good ?hypertension,  ?Rhythm:regular Rate:Normal ? ? ?  ?Neuro/Psych ?negative neurological ROS ? negative psych ROS  ? GI/Hepatic ?negative GI ROS, Neg liver ROS,   ?Endo/Other  ?diabetes, Type 2Morbid obesity ? Renal/GU ?negative Renal ROS  ?negative genitourinary ?  ?Musculoskeletal ? ? Abdominal ?  ?Peds ? Hematology ?negative hematology ROS ?(+)   ?Anesthesia Other Findings ? ? Reproductive/Obstetrics ?negative OB ROS ? ?  ? ? ? ? ? ? ? ? ? ? ? ? ? ?  ?  ? ? ? ? ? ? ? ? ?Anesthesia Physical ?Anesthesia Plan ? ?ASA: 2 ? ?Anesthesia Plan: General  ? ?Post-op Pain Management:   ? ?Induction:  ? ?PONV Risk Score and Plan: Propofol infusion ? ?Airway Management Planned:  ? ?Additional Equipment:  ? ?Intra-op Plan:  ? ?Post-operative Plan:  ? ?Informed Consent: I have reviewed the patients History and Physical, chart, labs and discussed the procedure including the risks, benefits and alternatives for the proposed anesthesia with the patient or authorized representative who has indicated his/her understanding and acceptance.  ? ? ? ?Dental Advisory Given ? ?Plan Discussed with: CRNA ? ?Anesthesia Plan Comments:   ? ? ? ? ? ? ?Anesthesia Quick Evaluation ? ?

## 2021-11-20 NOTE — Discharge Instructions (Signed)
You are being discharged to home.  ?Resume your previous diet.  ?We are waiting for your pathology results.  ?Your physician has recommended a repeat colonoscopy for surveillance based on pathology results.  ?Schedule CT abdomen and pelvis with IV contrast. ?

## 2021-11-20 NOTE — Anesthesia Procedure Notes (Signed)
Date/Time: 11/20/2021 12:53 PM ?Performed by: Orlie Dakin, CRNA ?Pre-anesthesia Checklist: Patient identified, Emergency Drugs available, Suction available and Patient being monitored ?Patient Re-evaluated:Patient Re-evaluated prior to induction ?Oxygen Delivery Method: Nasal cannula ?Induction Type: IV induction ?Placement Confirmation: positive ETCO2 ? ? ? ? ?

## 2021-11-20 NOTE — Transfer of Care (Signed)
Immediate Anesthesia Transfer of Care Note ? ?Patient: Tina Sampson ? ?Procedure(s) Performed: COLONOSCOPY WITH PROPOFOL ?POLYPECTOMY ? ?Patient Location: Endoscopy Unit ? ?Anesthesia Type:General ? ?Level of Consciousness: awake ? ?Airway & Oxygen Therapy: Patient Spontanous Breathing ? ?Post-op Assessment: Report given to RN and Post -op Vital signs reviewed and stable ? ?Post vital signs: Reviewed and stable ? ?Last Vitals:  ?Vitals Value Taken Time  ?BP    ?Temp    ?Pulse    ?Resp    ?SpO2    ? ? ?Last Pain:  ?Vitals:  ? 11/20/21 1246  ?TempSrc:   ?PainSc: 4   ?   ? ?Patients Stated Pain Goal: 6 (11/20/21 1144) ? ?Complications: No notable events documented. ?

## 2021-11-20 NOTE — Anesthesia Postprocedure Evaluation (Signed)
Anesthesia Post Note ? ?Patient: Tina Sampson ? ?Procedure(s) Performed: COLONOSCOPY WITH PROPOFOL ?POLYPECTOMY ? ?Patient location during evaluation: Phase II ?Anesthesia Type: General ?Level of consciousness: awake ?Pain management: pain level controlled ?Vital Signs Assessment: post-procedure vital signs reviewed and stable ?Respiratory status: spontaneous breathing and respiratory function stable ?Cardiovascular status: blood pressure returned to baseline and stable ?Postop Assessment: no headache and no apparent nausea or vomiting ?Anesthetic complications: no ?Comments: Late entry ? ? ?No notable events documented. ? ? ?Last Vitals:  ?Vitals:  ? 11/20/21 1327 11/20/21 1334  ?BP: (!) 89/51 93/60  ?Pulse:    ?Resp:    ?Temp:    ?SpO2:    ?  ?Last Pain:  ?Vitals:  ? 11/20/21 1321  ?TempSrc: Oral  ?PainSc: 0-No pain  ? ? ?  ?  ?  ?  ?  ?  ? ?Louann Sjogren ? ? ? ? ?

## 2021-11-20 NOTE — Op Note (Signed)
[facility] ?Patient Name: Tina Sampson ?Procedure Date: 11/20/2021 12:32 PM ?MRN: [id number] ?Date of Birth: 09-Jul-1961 ?Attending MD: Maylon Peppers ,  ?CSN: [id number] ?Age: 61 ?Admit Type: Outpatient ?Procedure:                Colonoscopy ?Indications:              Abdominal pain in the left lower quadrant ?Providers:                Maylon Peppers, Lambert Mody, Kristine L.  ?                          Risa Grill, Technician ?Referring MD:              ?Medicines:                Monitored Anesthesia Care ?Complications:            No immediate complications. ?Estimated Blood Loss:     Estimated blood loss: none. ?Procedure:                Pre-Anesthesia Assessment: ?                          - Prior to the procedure, a History and Physical  ?                          was performed, and patient medications, allergies  ?                          and sensitivities were reviewed. The patient's  ?                          tolerance of previous anesthesia was reviewed. ?                          - The risks and benefits of the procedure and the  ?                          sedation options and risks were discussed with the  ?                          patient. All questions were answered and informed  ?                          consent was obtained. ?                          - ASA Grade Assessment: II - A patient with mild  ?                          systemic disease. ?                          After obtaining informed consent, the colonoscope  ?                          was passed under direct vision. Throughout the  ?  procedure, the patient's blood pressure, pulse, and  ?                          oxygen saturations were monitored continuously. The  ?                          [id number]-HQ190L ([id number]) scope was introduced through  ?                          the anus and advanced to the the cecum, identified  ?                          by appendiceal orifice and ileocecal valve. The  ?                           colonoscopy was performed without difficulty. The  ?                          patient tolerated the procedure well. The quality  ?                          of the bowel preparation was adequate to identify  ?                          polyps 6 mm and larger in size. ?Scope In: 12:49:36 PM ?Scope Out: 1:17:34 PM ?Scope Withdrawal Time: 0 hours 17 minutes 47 seconds  ?Total Procedure Duration: 0 hours 27 minutes 58 seconds  ?Findings: ?     The perianal and digital rectal examinations were normal. ?     Four sessile polyps were found in the transverse colon and ascending  ?     colon. The polyps were 3 to 5 mm in size. These polyps were removed with  ?     a cold snare. Resection and retrieval were complete. ?     A 4 mm polyp was found in the descending colon. The polyp was sessile.  ?     The polyp was removed with a cold snare. Resection and retrieval were  ?     complete. ?     Multiple small and large-mouthed diverticula were found in the sigmoid  ?     colon and descending colon. ?     The retroflexed view of the distal rectum and anal verge was normal and  ?     showed no anal or rectal abnormalities. ?Impression:               - Four 3 to 5 mm polyps in the transverse colon and  ?                          in the ascending colon, removed with a cold snare.  ?                          Resected and retrieved. ?                          - One 4 mm polyp in the descending colon,  removed  ?                          with a cold snare. Resected and retrieved. ?                          - Diverticulosis in the sigmoid colon and in the  ?                          descending colon. ?                          - The distal rectum and anal verge are normal on  ?                          retroflexion view. ?Moderate Sedation: ?     Per Anesthesia Care ?Recommendation:           - Discharge patient to home (ambulatory). ?                          - Resume previous diet. ?                          -  Await pathology results. ?                          - Repeat colonoscopy for surveillance based on  ?                          pathology results. ?                          - Schedule CT abdomen and pelvis with IV contrast ?Procedure Code(s):        --- Professional --- ?                          [phone], Colonoscopy, flexible; with removal of  ?                          tumor(s), polyp(s), or other lesion(s) by snare  ?                          technique ?Diagnosis Code(s):        --- Professional --- ?                          K63.5, Polyp of colon ?                          R10.32, Left lower quadrant pain ?                          K57.30, Diverticulosis of large intestine without  ?                          perforation or abscess without bleeding ?CPT copyright 2019 [employer]. All rights  reserved. ?The codes documented in this report are preliminary and upon coder review may  ?be revised to meet current compliance requirements. ?Maylon Peppers, MD ?Maylon Peppers,  ?11/20/2021 1:33:17 PM ?This report has been signed electronically. ?Number of Addenda: 0 ?

## 2021-11-20 NOTE — Interval H&P Note (Signed)
History and Physical Interval Note: ? ?11/20/2021 ?11:39 AM ? ?[name]  has presented today for surgery, with the diagnosis of Abdominal Pain hx of tubular adenomas.  The various methods of treatment have been discussed with the patient and family. After consideration of risks, benefits and other options for treatment, the patient has consented to  Procedure(s) with comments: ?COLONOSCOPY WITH PROPOFOL (N/A) - 100 as a surgical intervention.  The patient's history has been reviewed, patient examined, no change in status, stable for surgery.  I have reviewed the patient's chart and labs.  Questions were answered to the patient's satisfaction.   ? ? ?Tina Sampson ? ? ?

## 2021-11-21 LAB — SURGICAL PATHOLOGY

## 2021-11-22 ENCOUNTER — Other Ambulatory Visit (INDEPENDENT_AMBULATORY_CARE_PROVIDER_SITE_OTHER): Payer: Self-pay

## 2021-11-22 ENCOUNTER — Encounter (HOSPITAL_COMMUNITY): Payer: Self-pay | Admitting: Gastroenterology

## 2021-11-22 DIAGNOSIS — G8929 Other chronic pain: Secondary | ICD-10-CM

## 2021-11-26 ENCOUNTER — Encounter (HOSPITAL_BASED_OUTPATIENT_CLINIC_OR_DEPARTMENT_OTHER): Payer: Self-pay

## 2021-11-26 ENCOUNTER — Ambulatory Visit (HOSPITAL_BASED_OUTPATIENT_CLINIC_OR_DEPARTMENT_OTHER)
Admission: RE | Admit: 2021-11-26 | Discharge: 2021-11-26 | Disposition: A | Discharge status: Home / Self Care | Payer: Medicare HMO | Source: Ambulatory Visit | Attending: Gastroenterology | Admitting: Gastroenterology

## 2021-11-26 DIAGNOSIS — G8929 Other chronic pain: Secondary | ICD-10-CM | POA: Insufficient documentation

## 2021-11-26 DIAGNOSIS — R1013 Epigastric pain: Secondary | ICD-10-CM | POA: Diagnosis present

## 2021-11-26 LAB — POCT I-STAT CREATININE: Creatinine, Ser: 1.1 mg/dL — ABNORMAL HIGH (ref 0.44–1.00)

## 2021-11-26 MED ORDER — IOHEXOL 300 MG/ML  SOLN
100.0000 mL | Freq: Once | INTRAMUSCULAR | Status: AC | PRN
  Administered 2021-11-26: 100 mL via INTRAVENOUS

## 2021-12-02 ENCOUNTER — Other Ambulatory Visit: Payer: Self-pay | Admitting: "Endocrinology

## 2022-01-02 ENCOUNTER — Other Ambulatory Visit: Payer: Self-pay | Admitting: "Endocrinology

## 2022-01-06 ENCOUNTER — Ambulatory Visit: Payer: Medicare HMO | Admitting: "Endocrinology

## 2022-01-08 ENCOUNTER — Encounter: Payer: Self-pay | Admitting: "Endocrinology

## 2022-01-08 ENCOUNTER — Ambulatory Visit (INDEPENDENT_AMBULATORY_CARE_PROVIDER_SITE_OTHER): Payer: Medicare HMO | Admitting: "Endocrinology

## 2022-01-08 VITALS — BP 98/62 | HR 76 | Ht 68.0 in | Wt 247.6 lb

## 2022-01-08 DIAGNOSIS — I10 Essential (primary) hypertension: Secondary | ICD-10-CM | POA: Diagnosis not present

## 2022-01-08 DIAGNOSIS — E1165 Type 2 diabetes mellitus with hyperglycemia: Secondary | ICD-10-CM | POA: Diagnosis not present

## 2022-01-08 DIAGNOSIS — Z6838 Body mass index (BMI) 38.0-38.9, adult: Secondary | ICD-10-CM

## 2022-01-08 DIAGNOSIS — E782 Mixed hyperlipidemia: Secondary | ICD-10-CM

## 2022-01-08 LAB — POCT GLYCOSYLATED HEMOGLOBIN (HGB A1C): HbA1c, POC (controlled diabetic range): 6.6 % (ref 0.0–7.0)

## 2022-01-08 MED ORDER — BD SWAB SINGLE USE REGULAR PADS: MEDICATED_PAD | 2 refills | Status: DC

## 2022-01-08 NOTE — Patient Instructions (Signed)

## 2022-01-08 NOTE — Progress Notes (Signed)
01/08/2022, 6:27 PM                Endocrinology follow-up note  Subjective:    Patient ID: Tina Sampson, female    DOB: 1961-07-14.  NAHIA NISSAN is being seen in follow-up in the management of currently uncontrolled symptomatic type 2 diabetes, hyperlipidemia, hypertension. PMD:  Marijean Heath, NP.   Past Medical History:  Diagnosis Date   Abdominal pain 10/15/2017   Diabetes mellitus    Diabetes mellitus, type II ([place])    H/O knee surgery    Hyperlipidemia    IBS (irritable bowel syndrome)    Past Surgical History:  Procedure Laterality Date   ANKLE SURGERY Left    CARDIAC CATHETERIZATION     catherization     CERVICAL DISC SURGERY     CESAREAN SECTION     CHOLECYSTECTOMY     COLONOSCOPY WITH PROPOFOL N/A 11/20/2021   Procedure: COLONOSCOPY WITH PROPOFOL;  Surgeon: Harvel Quale, MD;  [facility]: [facility];  Service: Gastroenterology;  Laterality: N/A;  100   KNEE SURGERY     KNEE SURGERY     POLYPECTOMY  11/20/2021   Procedure: POLYPECTOMY;  Surgeon: Montez Morita, Quillian Quince, MD;  Location: [facility];  Service: Gastroenterology;;   TUBAL LIGATION     Social History   Socioeconomic History   Marital status: Married    Spouse name: Not on file   Number of children: Not on file   Years of education: Not on file   Highest education level: Not on file  Occupational History   Not on file  Tobacco Use   Smoking status: Never    Passive exposure: Current   Smokeless tobacco: Never  Vaping Use   Vaping Use: Never used  Substance and Sexual Activity   Alcohol use: No   Drug use: No   Sexual activity: Yes  Other Topics Concern   Not on file  Social History Narrative   Not on file   Social Determinants of Health   Financial Resource Strain: Not on file  Food Insecurity: Not on file  Transportation Needs: Not on file  Physical Activity: Not on  file  Stress: Not on file  Social Connections: Not on file   Outpatient Encounter Medications as of 01/08/2022  Medication Sig   Accu-Chek Softclix Lancets lancets TEST BLOOD SUGAR THREE TIMES DAILY AS DIRECTED   albuterol (VENTOLIN HFA) 108 (90 Base) MCG/ACT inhaler Inhale 1-2 puffs into the lungs every 4 (four) hours as needed for wheezing or shortness of breath.   Alcohol Swabs (B-D SINGLE USE SWABS REGULAR) PADS Use before testing glucose and injecting Bydureon   ascorbic acid (VITAMIN C) 500 MG tablet Take 500 mg by mouth daily.   aspirin EC 81 MG tablet Take 81 mg daily by mouth.   B Complex Vitamins (VITAMIN B COMPLEX PO) Take 1 tablet by mouth daily.   Blood Glucose Monitoring Suppl (ACCU-CHEK GUIDE) w/Device KIT Use to test BG tid. DX e11.65   cholecalciferol (VITAMIN D3) 25 MCG (1000 UNIT) tablet Take 1,000 Units by mouth daily.   dicyclomine (BENTYL) 10  MG capsule Take 1 capsule (10 mg total) by mouth 3 (three) times daily as needed for spasms.   diphenhydramine-acetaminophen (TYLENOL PM) 25-500 MG TABS tablet Take 1 tablet by mouth at bedtime as needed (sleep/pain).   EPINEPHrine 0.3 mg/0.3 mL IJ SOAJ injection Inject 0.3 mg into the muscle as needed for anaphylaxis.   Exenatide ER (BYDUREON BCISE) 2 MG/0.85ML AUIJ INJECT 2MG ONCE WEEKLY   gabapentin (NEURONTIN) 300 MG capsule Take 900 mg by mouth 2 (two) times daily.    glucose blood (ACCU-CHEK GUIDE) test strip TEST BLOOD SUGAR THREE TIMES DAILY   hydrOXYzine (ATARAX/VISTARIL) 25 MG tablet Take 25 mg by mouth every 8 (eight) hours as needed for anxiety.   lisinopril (ZESTRIL) 5 MG tablet TAKE ONE TABLET ONCE DAILY   methocarbamol (ROBAXIN) 500 MG tablet Take 500 mg by mouth every 8 (eight) hours as needed for muscle spasms.   Multiple Vitamin (MULTIVITAMIN) tablet Take 1 tablet daily by mouth.   omeprazole (PRILOSEC) 20 MG capsule Take 20 mg by mouth in the morning and at bedtime.   pramipexole (MIRAPEX) 0.5 MG tablet Take 0.5 mg  by mouth 3 (three) times daily. One tid For RLS   pravastatin (PRAVACHOL) 40 MG tablet Take 40 mg by mouth at bedtime.   propranolol (INDERAL) 10 MG tablet Take 10 mg by mouth 2 (two) times daily.   topiramate (TOPAMAX) 25 MG tablet Take 25 mg by mouth 2 (two) times daily.   traZODone (DESYREL) 50 MG tablet Take 50 mg by mouth at bedtime.   venlafaxine XR (EFFEXOR-XR) 150 MG 24 hr capsule Take 150 mg by mouth daily.   [DISCONTINUED] Alcohol Swabs (B-D SINGLE USE SWABS REGULAR) PADS    No facility-administered encounter medications on file as of 01/08/2022.    ALLERGIES: Allergies  Allergen Reactions   Bee Venom Shortness Of Breath, Itching and Swelling    VACCINATION STATUS: Immunization History  Administered Date(s) Administered   Influenza Split 05/07/2013   Moderna Sars-Covid-2 Vaccination 10/05/2019, 11/02/2019, 08/28/2020    Diabetes She presents for her follow-up diabetic visit. She has type 2 diabetes mellitus. Onset time: She was diagnosed at approximate age of 64 years. Her disease course has been stable. There are no hypoglycemic associated symptoms. Pertinent negatives for hypoglycemia include no confusion, headaches, pallor or seizures. Pertinent negatives for diabetes include no blurred vision, no chest pain, no polydipsia, no polyphagia, no polyuria and no weight loss. There are no hypoglycemic complications. Symptoms are stable. There are no diabetic complications. Risk factors for coronary artery disease include dyslipidemia, diabetes mellitus, obesity, sedentary lifestyle and post-menopausal. Current diabetic treatments: She is currently on Bydureon 2 mg subcutaneously weekly. Her weight is fluctuating minimally. She is following a generally unhealthy diet. When asked about meal planning, she reported none. She has not had a previous visit with a dietitian. She never participates in exercise. Her home blood glucose trend is decreasing steadily. Her breakfast blood glucose  range is generally 130-140 mg/dl. Her bedtime blood glucose range is generally 140-180 mg/dl. Her overall blood glucose range is 130-140 mg/dl. (She presents with controlled glycemic profile, averaging 135-151 over the last 30 days.  Her point-of-care A1c is 6.6%, overall improving from 7.3%.  She did not document any hypoglycemia.  ) An ACE inhibitor/angiotensin II receptor blocker is being taken. She does not see a podiatrist.Eye exam is current (She has not seen her ophthalmologist in 2 years, I urged to reschedule a visit.).  Hyperlipidemia This is a chronic problem. The  current episode started more than 1 year ago. The problem is controlled. Exacerbating diseases include diabetes and obesity. Pertinent negatives include no chest pain, myalgias or shortness of breath. Current antihyperlipidemic treatment includes statins. Risk factors for coronary artery disease include diabetes mellitus, dyslipidemia, obesity, a sedentary lifestyle and post-menopausal.    Review of systems  Constitutional: + Progressive weight loss,  current  Body mass index is 37.65 kg/m. , no fatigue, no subjective hyperthermia, no subjective hypothermia   Objective:    BP 98/62   Pulse 76   Ht '5\' 8"'  (1.727 m)   Wt 247 lb 9.6 oz (112.3 kg)   LMP 03/19/2011   BMI 37.65 kg/m   Wt Readings from Last 3 Encounters:  01/08/22 247 lb 9.6 oz (112.3 kg)  11/20/21 241 lb (109.3 kg)  11/04/21 251 lb 11.2 oz (114.2 kg)    Physical Exam- Limited  Constitutional:  Body mass index is 37.65 kg/m. , not in acute distress, normal state of mind   Recent Results (from the past 2160 hour(s))  HM COLONOSCOPY     Status: None   Collection Time: 11/20/21 12:00 AM  Result Value Ref Range   HM Colonoscopy See Report (in chart) See Report (in chart), Patient Reported  Glucose, capillary     Status: Abnormal   Collection Time: 11/20/21 11:54 AM  Result Value Ref Range   Glucose-Capillary 135 (H) 70 - 99 mg/dL    Comment: Glucose  reference range applies only to samples taken after fasting for at least 8 hours.  Surgical pathology     Status: None   Collection Time: 11/20/21 12:56 PM  Result Value Ref Range   SURGICAL PATHOLOGY      SURGICAL PATHOLOGY CASE: [id number] PATIENT: Isidora Porreca Surgical Pathology Report     Clinical History: abdominal pain hx of tubular adenomas     FINAL MICROSCOPIC DIAGNOSIS:  A. COLON, TRANSVERSE, ASCENDING, POLYPECTOMY: - Tubular adenoma(s) without high-grade dysplasia or malignancy - Sessile serrated polyp(s) without cytologic dysplasia  B. COLON, DESCENDING, POLYPECTOMY: - Tubular adenoma without high-grade dysplasia or malignancy      GROSS DESCRIPTION:  A. Received in formalin labeled with the patient's name and DOB is a 1.7 x 1.5 x 0.2 cm aggregate of tan, rubbery, polypoid pieces of tissue. The larger pieces are sectioned and the tissue is entirely submitted in a single cassette.  B. Received in formalin labeled with the patient's name and DOB is a 0.5 cm tan, rubbery, polypoid piece of tissue that is bisected and entirely submitted in a single cassette.  (LEF 11/20/2021)  Final Diagnosis performed by Jaquita Folds, MD.   Electron ically signed 11/21/2021 Technical component performed at [facility], [place] [address], [place], [place] [address].  Professional component performed at [facility], [place] [address], [place], [place] [address].  Immunohistochemistry Technical component (if applicable) was performed at [facility]. [address], [place], [place], [place] [address].   IMMUNOHISTOCHEMISTRY DISCLAIMER (if applicable): Some of these immunohistochemical stains may have been developed and the performance characteristics determine by [facility]. Some may not have been cleared or approved by the U.S. Food and Drug Administration. The FDA has determined that such  clearance or approval is not necessary. This test is used for clinical purposes. It should not be regarded as investigational or for research. This laboratory is certified under the [place] (CLIA-88) as qualified to perform  high complexity clinical laboratory  testing.  The controls stained appropriately.   I-STAT creatinine     Status: Abnormal   Collection Time: 11/26/21  3:56 PM  Result Value Ref Range   Creatinine, Ser 1.10 (H) 0.44 - 1.00 mg/dL  HgB A1c     Status: None   Collection Time: 01/08/22  9:00 AM  Result Value Ref Range   Hemoglobin A1C     HbA1c POC (<> result, manual entry)     HbA1c, POC (prediabetic range)     HbA1c, POC (controlled diabetic range) 6.6 0.0 - 7.0 %    Lipid Panel     Component Value Date/Time   CHOL 157 09/24/2021 0942   TRIG 202 (H) 09/24/2021 0942   HDL 47 09/24/2021 0942   CHOLHDL 3.3 09/24/2021 0942   CHOLHDL 3.7 01/13/2020 1112   LDLCALC 76 09/24/2021 0942   LDLCALC 71 01/13/2020 1112    Assessment & Plan:   1. Uncontrolled type 2 diabetes mellitus with hyperglycemia ([place])  - Kindall L Ridlon has currently uncontrolled symptomatic type 2 DM since  61 years of age.  She presents with controlled glycemic profile, averaging 135-151 over the last 30 days.  Her point-of-care A1c is 6.6%, overall improving from 7.3%.  She did not document any hypoglycemia.   Recent labs reviewed.  -her diabetes is complicated by obesity/sedentary life and SHAVONN CONVEY remains at a high risk for more acute and chronic complications which include CAD, CVA, CKD, retinopathy, and neuropathy. These are all discussed in detail with the patient.  - I have counseled her on diet management and weight loss, by adopting a carbohydrate restricted/protein rich diet. - she acknowledges that there is a room for improvement in her food and drink choices. - Suggestion is made for her to avoid simple carbohydrates  from her  diet including Cakes, Sweet Desserts, Ice Cream, Soda (diet and regular), Sweet Tea, Candies, Chips, Cookies, Store Bought Juices, Alcohol in Excess of  1-2 drinks a day, Artificial Sweeteners,  Coffee Creamer, and "Sugar-free" Products, Lemonade. This will help patient to have more stable blood glucose profile and potentially avoid unintended weight gain.   - I encouraged her to switch to  unprocessed or minimally processed complex starch and increased protein intake (animal or plant source), fruits, and vegetables.  - she is advised to stick to a routine mealtimes to eat 3 meals  a day and avoid unnecessary snacks ( to snack only to correct hypoglycemia).   - I have approached her with the following individualized plan to manage diabetes and patient agrees:   -In light of her presentation with controlled glycemic profile and A1c of 6.6%,  she will not need insulin treatment for now.  She is responding to the weekly injection of Bydureon.  She is advised to continue Bydureon 2 mg subcutaneously weekly.   She is advised and willing to continue monitoring blood glucose at least once a day before breakfast.    She will be reconsidered for low-dose metformin during her next visit if A1c increases to above 7%.        2) BP/HTN -Her blood pressure is controlled to target.  She is currently on lisinopril 5 mg p.o. daily with breakfast, advised to continue.    3) Lipids/HPL:   Her most recent lipid panel showed controlled LDL at 65.  She is advised to continue pravastatin 40 mg p.o. nightly.   Side effects and precautions discussed with her.  4) weight management: Her BMI is 37.65-,  no further success after she lost 20 pounds prior to her last visit.    She is a candidate for  more modest weight loss.  Carbs restricted diet and exercise regimen were discussed with her.  Whole food plant-based nutrition is discussed with her.  5) Chronic Care/Health Maintenance:  -she  is on  Statin medications  and   lisinopril is encouraged to continue to follow up with Ophthalmology, Dentist,  Podiatrist at least yearly or according to recommendations, and advised to  stay away from smoking. I have recommended yearly flu vaccine and pneumonia vaccination at least every 5 years; moderate intensity exercise for up to 150 minutes weekly; and  sleep for at least 7 hours a day.  Her screen ABI was abnormal in March 2022.  She was evaluated by vascular surgery with better testing showing no evidence of occlusive peripheral arterial disease.    - I advised patient to maintain close follow up with Marijean Heath, NP for primary care needs.   I spent 41 minutes in the care of the patient today including review of labs from [place], Lipids, Thyroid Function, Hematology (current and previous including abstractions from other facilities); face-to-face time discussing  her blood glucose readings/logs, discussing hypoglycemia and hyperglycemia episodes and symptoms, medications doses, her options of short and long term treatment based on the latest standards of care / guidelines;  discussion about incorporating lifestyle medicine;  and documenting the encounter.    Please refer to Patient Instructions for Blood Glucose Monitoring and Insulin/Medications Dosing Guide"  in media tab for additional information. Please  also refer to " Patient Self Inventory" in the Media  tab for reviewed elements of pertinent patient history.  [name] participated in the discussions, expressed understanding, and voiced agreement with the above plans.  All questions were answered to her satisfaction. she is encouraged to contact clinic should she have any questions or concerns prior to her return visit.   Follow up plan: - Return in about 4 months (around 05/10/2022) for F/U with Pre-visit Labs, Meter/CGM/Logs, A1c here, Urine MA - [place].  Glade Lloyd, MD [facility] [address] [place], [place] [address] Phone: [phone]  Fax: [phone]    01/08/2022, 6:27 PM  This note was partially dictated with voice recognition software. Similar sounding words can be transcribed inadequately or may not  be corrected upon review.

## 2022-02-06 ENCOUNTER — Encounter (INDEPENDENT_AMBULATORY_CARE_PROVIDER_SITE_OTHER): Payer: Self-pay | Admitting: Gastroenterology

## 2022-02-06 ENCOUNTER — Ambulatory Visit (INDEPENDENT_AMBULATORY_CARE_PROVIDER_SITE_OTHER): Payer: Medicare HMO | Admitting: Gastroenterology

## 2022-02-06 VITALS — BP 93/67 | HR 74 | Temp 98.1°F | Ht 68.0 in | Wt 247.0 lb

## 2022-02-06 DIAGNOSIS — R1031 Right lower quadrant pain: Secondary | ICD-10-CM | POA: Diagnosis not present

## 2022-02-06 DIAGNOSIS — K581 Irritable bowel syndrome with constipation: Secondary | ICD-10-CM | POA: Diagnosis not present

## 2022-02-06 DIAGNOSIS — G8929 Other chronic pain: Secondary | ICD-10-CM | POA: Diagnosis not present

## 2022-02-06 MED ORDER — HYOSCYAMINE SULFATE 0.125 MG SL SUBL: 0.1250 mg | SUBLINGUAL_TABLET | Freq: Four times a day (QID) | SUBLINGUAL | 2 refills | Status: DC | PRN

## 2022-02-06 NOTE — Patient Instructions (Signed)
As we discussed, it is reassuring that colonoscopy nor imaging of the abdomen have showed anything concerning in relation to your pain, I suspect pain is related to IBS. Please try to follow low FODMAP food guide and keep food/symptom journal for 2-3 weeks to see if there is any correlation in what you are eating with symptoms. I have also sent levsin for you to try in place of bentyl, you can use this every 6 hours as needed for pain, let me know if this does not work for you.  Follow up 1 year

## 2022-02-06 NOTE — Progress Notes (Signed)
Referring Provider: Marijean Heath, * Primary Care Physician:  Marijean Heath, NP Primary GI Physician: Jenetta Downer  Chief Complaint  Patient presents with   Abdominal Pain    3 month follow up on RLQ pain. Patient states she lost diet plan she was given for IBS. Bentyl not helping much. Pcp gave tylenol #3 but states she is out of med.    HPI:   Tina Sampson is a 61 y.o. female with past medical history of DM type 2, HLD.  Patient presenting today for follow up of RLQ pain  Last seen 11/04/21 for RLQ pain  Seen at [facility] ED on 08/24/21, CT pelvis w/o contrast then with colonic diverticulosis. Labs unremarkable other than slightly elevated Alk Phos of 134 (down to 127 on repeat 09/24/21). UA with 40m/dl urobilinogen. Notably, patient was seen here in 2019 with c/o RLQ without etiological findings at that time. Previous imaging of liver march 2019 with fatty infiltration of liver.    She reported chronic RLQ pain since 2019.  No associated constipation or diarrhea.  1-2 BMs per day. Pain sometimes improved with advil dual action. Pain sometimes worse with certain foods/more stress. Also with occasional issues with foods and sometimes larger pills feeling stuck just above sternal notch. on omeprazole 228mBID, with heartburn maybe once per week.   She was given low FODMAP diet, bentyl 1051mID PRN and advised to keep food journal x2 weeks. Continued on omeprazole 16m55mD and scheduled for colonoscopy, findings as outlined below.  CT A/P done 11/27/21 with significant amount of stool present, advised to do miralax, continue bentyl and low FODMAP diet.   Present: She states that she has continued RLQ pain, she is taking bentyl 10mg68m with some relief but never complete resolution of pain. She continues to have pain almost daily though some days it is worse than others. Pain seems to be worse if she has been sitting on the mower for some time. She cannot seem to pinpoint any  other precipitating factors. She lost low FODMAP guide given at last OV.  She has not been able to correlate pain with certain food triggers though she was unable to keep food journal as recommended at last visit. She reports she has had an increase in stress due to some family issues, feels symptoms may be worse when she is feeling more stressed.   She is taking Miralax as needed, she has a BM 1-2x/ day. She feels that she is emptying bowels sufficiently. Denies rectal bleeding or melena.   She denies changes in her appetite, she has been trying to lose some weight recently. She is drinking more water which is the main change she has made, also eating at [employer] more.   She saw GYN recently who did pelvic [place] no abnormalities of ovaries or uterus to explain RLQ pain either.   No red flag symptoms. Patient denies melena, hematochezia, nausea, vomiting, diarrhea, constipation, dysphagia, odyonophagia, early satiety or unintentional weight loss.   Last Colonoscopy: 11/20/21 Four 3 to 5 mm polyps in the transverse colon and in the ascending colon - One 4 mm polyp in the descending colon - Diverticulosis in the sigmoid colon and in the descending colon. - The distal rectum and anal verge are normal on retroflexion view. (4 tubular adenomas, one SSL) Last Endoscopy:never  Recommendations:  Repeat colonoscopy in April 2026   Past Medical History:  Diagnosis Date   Abdominal pain 10/15/2017  Diabetes mellitus    Diabetes mellitus, type II ([place])    H/O knee surgery    Hyperlipidemia    IBS (irritable bowel syndrome)     Past Surgical History:  Procedure Laterality Date   ANKLE SURGERY Left    CARDIAC CATHETERIZATION     catherization     CERVICAL DISC SURGERY     CESAREAN SECTION     CHOLECYSTECTOMY     COLONOSCOPY WITH PROPOFOL N/A 11/20/2021   Procedure: COLONOSCOPY WITH PROPOFOL;  Surgeon: Harvel Quale, MD;  [facility];  Service: Gastroenterology;   Laterality: N/A;  100   KNEE SURGERY     KNEE SURGERY     POLYPECTOMY  11/20/2021   Procedure: POLYPECTOMY;  Surgeon: Montez Morita, Quillian Quince, MD;  Location: [facility];  Service: Gastroenterology;;   TUBAL LIGATION      Current Outpatient Medications  Medication Sig Dispense Refill   Accu-Chek Softclix Lancets lancets TEST BLOOD SUGAR THREE TIMES DAILY AS DIRECTED 200 each 2   acetaminophen-codeine (TYLENOL #3) 300-30 MG tablet Take by mouth.     albuterol (VENTOLIN HFA) 108 (90 Base) MCG/ACT inhaler Inhale 1-2 puffs into the lungs every 4 (four) hours as needed for wheezing or shortness of breath.     Alcohol Swabs (B-D SINGLE USE SWABS REGULAR) PADS Use before testing glucose and injecting Bydureon 100 each 2   ascorbic acid (VITAMIN C) 500 MG tablet Take 500 mg by mouth daily.     aspirin EC 81 MG tablet Take 81 mg daily by mouth.     B Complex Vitamins (VITAMIN B COMPLEX PO) Take 1 tablet by mouth daily.     Blood Glucose Monitoring Suppl (ACCU-CHEK GUIDE) w/Device KIT Use to test BG tid. DX e11.65 1 kit 0   cholecalciferol (VITAMIN D3) 25 MCG (1000 UNIT) tablet Take 1,000 Units by mouth daily.     dicyclomine (BENTYL) 10 MG capsule Take 1 capsule (10 mg total) by mouth 3 (three) times daily as needed for spasms. 90 capsule 3   diphenhydramine-acetaminophen (TYLENOL PM) 25-500 MG TABS tablet Take 1 tablet by mouth at bedtime as needed (sleep/pain).     EPINEPHrine 0.3 mg/0.3 mL IJ SOAJ injection Inject 0.3 mg into the muscle as needed for anaphylaxis.     Exenatide ER (BYDUREON BCISE) 2 MG/0.85ML AUIJ INJECT 2MG ONCE WEEKLY 3.4 mL 1   gabapentin (NEURONTIN) 300 MG capsule Take 900 mg by mouth 2 (two) times daily.      glucose blood (ACCU-CHEK GUIDE) test strip TEST BLOOD SUGAR THREE TIMES DAILY 300 strip 2   hydrOXYzine (ATARAX/VISTARIL) 25 MG tablet Take 25 mg by mouth every 8 (eight) hours as needed for anxiety.     lisinopril (ZESTRIL) 5 MG tablet TAKE ONE TABLET ONCE DAILY 90  tablet 0   Multiple Vitamin (MULTIVITAMIN) tablet Take 1 tablet daily by mouth.     omeprazole (PRILOSEC) 20 MG capsule Take 20 mg by mouth in the morning and at bedtime.     pramipexole (MIRAPEX) 0.5 MG tablet Take 0.5 mg by mouth 3 (three) times daily. One tid For RLS     pravastatin (PRAVACHOL) 40 MG tablet Take 40 mg by mouth at bedtime.     propranolol (INDERAL) 10 MG tablet Take 10 mg by mouth 2 (two) times daily.     topiramate (TOPAMAX) 25 MG tablet Take 25 mg by mouth 2 (two) times daily.     traZODone (DESYREL) 50 MG tablet Take 50  mg by mouth at bedtime.     venlafaxine XR (EFFEXOR-XR) 150 MG 24 hr capsule Take 150 mg by mouth daily.     No current facility-administered medications for this visit.    Allergies as of 02/06/2022 - Review Complete 02/06/2022  Allergen Reaction Noted   Bee venom Shortness Of Breath, Itching, and Swelling 05/15/2011    Family History  Problem Relation Age of Onset   Cancer Mother    Heart failure Mother    Hyperlipidemia Mother    Diabetes Mother    Osteoarthritis Sister    Heart failure Brother     Social History   Socioeconomic History   Marital status: Married    Spouse name: Not on file   Number of children: Not on file   Years of education: Not on file   Highest education level: Not on file  Occupational History   Not on file  Tobacco Use   Smoking status: Never    Passive exposure: Current   Smokeless tobacco: Never  Vaping Use   Vaping Use: Never used  Substance and Sexual Activity   Alcohol use: No   Drug use: No   Sexual activity: Yes  Other Topics Concern   Not on file  Social History Narrative   Not on file   Social Determinants of Health   Financial Resource Strain: Not on file  Food Insecurity: Not on file  Transportation Needs: Not on file  Physical Activity: Not on file  Stress: Not on file  Social Connections: Not on file   Review of systems General: negative for malaise, night sweats, fever,  chills, weight loss Neck: Negative for lumps, goiter, pain and significant neck swelling Resp: Negative for cough, wheezing, dyspnea at rest CV: Negative for chest pain, leg swelling, palpitations, orthopnea GI: denies melena, hematochezia, nausea, vomiting, diarrhea, constipation, dysphagia, odyonophagia, early satiety or unintentional weight loss. +RLQ pain MSK: Negative for joint pain or swelling, back pain, and muscle pain. Derm: Negative for itching or rash Psych: Denies depression, anxiety, memory loss, confusion. No homicidal or suicidal ideation.  Heme: Negative for prolonged bleeding, bruising easily, and swollen nodes. Endocrine: Negative for cold or heat intolerance, polyuria, polydipsia and goiter. Neuro: negative for tremor, gait imbalance, syncope and seizures. The remainder of the review of systems is noncontributory.  Physical Exam: BP 93/67 (BP Location: Left Arm, Patient Position: Sitting, Cuff Size: Large)   Pulse 74   Temp 98.1 F (36.7 C) (Oral)   Ht _0  (1.727 m)   Wt 247 lb (112 kg)   LMP 03/19/2011   BMI 37.56 kg/m  General:   Alert and oriented. No distress noted. Pleasant and cooperative.  Head:  Normocephalic and atraumatic. Eyes:  Conjuctiva clear without scleral icterus. Mouth:  Oral mucosa pink and moist. Good dentition. No lesions. Heart: Normal rate and rhythm, s1 and s2 heart sounds present.  Lungs: Clear lung sounds in all lobes. Respirations equal and unlabored. Abdomen:  +BS, soft, non-distended. TTP of RLQ. No rebound or guarding. No HSM or masses noted. Derm: No palmar erythema or jaundice Msk:  Symmetrical without gross deformities. Normal posture. Extremities:  Without edema. Neurologic:  Alert and  oriented x4 Psych:  Alert and cooperative. Normal mood and affect.  Invalid input(s): "6 MONTHS"   ASSESSMENT: Tina Sampson is a 61 y.o. female presenting today for RLQ pain, suspected secondary to IBS.  Continued with RLQ pain, this  has been chronic since 2019, colonoscopy and multiple CT imaging  done without findings to suggest specific cause of her symptoms. Suspect symptoms are secondary to IBS. She notes worsening of pain at times of higher stress. She misplaced low FODMAP food guide given at last visit, we discussed, again, trialing this and keeping food/symptom journal x2-3 weeks to correlate pain with certain triggers. She should also be mindful of stress management as this can certainly worsen symptoms of IBS. Will try levsin q6h in place of bentyl as she states she gets some relief from bentyl but not a lot. She will let me know if this does not work for her. She is having a BM 1-2x/day and feels she is emptying bowels sufficiently. Using miralax PRN. No red flag symptoms. Patient denies melena, hematochezia, nausea, vomiting, diarrhea, constipation, dysphagia, odyonophagia, early satiety or weight loss.    PLAN:  Low FODMAP food guide 2. Food/symptom journal x2-3 weeks 3.stress management 4. Rx levsin 0.185m q6h  All questions were answered, patient verbalized understanding and is in agreement with plan as outlined above.   Follow Up: 1 year  Nawaf Strange L. CAlver Sorrow MSN, APRN, AGNP-C Adult-Gerontology Nurse Practitioner RUpper [facility]

## 2022-03-06 ENCOUNTER — Other Ambulatory Visit: Payer: Self-pay | Admitting: "Endocrinology

## 2022-03-07 ENCOUNTER — Telehealth: Payer: Self-pay

## 2022-03-07 NOTE — Telephone Encounter (Signed)
Refill request received for Dicyclomine 10 MG Capsule Qty: 90. To be sent to [facility]. Patient's last ov was 02/06/2022.

## 2022-03-16 ENCOUNTER — Emergency Department (HOSPITAL_COMMUNITY): Payer: Medicare HMO

## 2022-03-16 ENCOUNTER — Other Ambulatory Visit: Payer: Self-pay

## 2022-03-16 ENCOUNTER — Encounter (HOSPITAL_COMMUNITY): Payer: Self-pay

## 2022-03-16 ENCOUNTER — Emergency Department (HOSPITAL_COMMUNITY)
Admission: EM | Admit: 2022-03-16 | Discharge: 2022-03-16 | Disposition: A | Discharge status: Home / Self Care | Payer: Medicare HMO | Attending: Emergency Medicine | Admitting: Emergency Medicine

## 2022-03-16 DIAGNOSIS — R079 Chest pain, unspecified: Secondary | ICD-10-CM | POA: Diagnosis not present

## 2022-03-16 DIAGNOSIS — Z79899 Other long term (current) drug therapy: Secondary | ICD-10-CM | POA: Insufficient documentation

## 2022-03-16 DIAGNOSIS — I10 Essential (primary) hypertension: Secondary | ICD-10-CM | POA: Diagnosis not present

## 2022-03-16 DIAGNOSIS — M25512 Pain in left shoulder: Secondary | ICD-10-CM | POA: Insufficient documentation

## 2022-03-16 DIAGNOSIS — Z7982 Long term (current) use of aspirin: Secondary | ICD-10-CM | POA: Diagnosis not present

## 2022-03-16 DIAGNOSIS — E119 Type 2 diabetes mellitus without complications: Secondary | ICD-10-CM | POA: Insufficient documentation

## 2022-03-16 LAB — BASIC METABOLIC PANEL
Anion gap: 7 (ref 5–15)
BUN: 18 mg/dL (ref 8–23)
CO2: 22 mmol/L (ref 22–32)
Calcium: 8.9 mg/dL (ref 8.9–10.3)
Chloride: 112 mmol/L — ABNORMAL HIGH (ref 98–111)
Creatinine, Ser: 0.9 mg/dL (ref 0.44–1.00)
GFR, Estimated: >60 mL/min (ref >60)
Glucose, Bld: 151 mg/dL — ABNORMAL HIGH (ref 70–99)
Potassium: 4.3 mmol/L (ref 3.5–5.1)
Sodium: 141 mmol/L (ref 135–145)

## 2022-03-16 LAB — CBC
HCT: 36.8 % (ref 36.0–46.0)
Hemoglobin: 11.6 g/dL — ABNORMAL LOW (ref 12.0–15.0)
MCH: 26 pg (ref 26.0–34.0)
MCHC: 31.5 g/dL (ref 30.0–36.0)
MCV: 82.5 fL (ref 80.0–100.0)
Platelets: 207 10*3/uL (ref 150–400)
RBC: 4.46 MIL/uL (ref 3.87–5.11)
RDW: 15.7 % — ABNORMAL HIGH (ref 11.5–15.5)
WBC: 5.3 10*3/uL (ref 4.0–10.5)
nRBC: 0 % (ref 0.0–0.2)

## 2022-03-16 LAB — TROPONIN I (HIGH SENSITIVITY)
Troponin I (High Sensitivity): <2 ng/L (ref <18)
Troponin I (High Sensitivity): <2 ng/L (ref <18)

## 2022-03-16 MED ORDER — ACETAMINOPHEN 500 MG PO TABS
1000.0000 mg | ORAL_TABLET | Freq: Once | ORAL | Status: AC
Start: 2022-03-16 — End: 2022-03-16
  Administered 2022-03-16: 1000 mg via ORAL
  Filled 2022-03-16: qty 2

## 2022-03-16 NOTE — ED Triage Notes (Signed)
Pt c/o left arm pain x 1 week and now radiating into her shoulder and chest. No n/v noted.

## 2022-03-16 NOTE — ED Provider Notes (Signed)
[facility] EMERGENCY DEPARTMENT Provider Note  CSN: [id number] Arrival date & time: 03/16/22 0911  Chief Complaint(s) Arm Pain and Chest Pain  HPI Tina Sampson is a 61 y.o. female presenting to the emergency department with left shoulder pain.  Patient reports 1 week of left shoulder pain, which was occasionally radiating into the left chest.  She denies trauma.  She denies nausea, vomiting, shortness of breath, diaphoresis, leg swelling, cough, runny nose, sore throat, fevers, chills.  She reports that the symptoms are mild.  The symptoms are worse with moving her left arm.  Denies pain in the elbow or wrist.  Denies numbness or tingling.   Past Medical History Past Medical History:  Diagnosis Date   Abdominal pain 10/15/2017   Diabetes mellitus    Diabetes mellitus, type II ([facility])    H/O knee surgery    Hyperlipidemia    IBS (irritable bowel syndrome)    Patient Active Problem List   Diagnosis Date Noted   Irritable bowel syndrome with constipation 02/06/2022   Dysphagia 11/05/2021   Tubular adenoma of colon 11/04/2021   Abdominal pain, chronic, right lower quadrant 11/04/2021   Essential hypertension, benign 11/23/2018   Abdominal pain 10/15/2017   Uncontrolled type 2 diabetes mellitus with hyperglycemia (HCC) 06/10/2017   Mixed hyperlipidemia 06/10/2017   Class 2 severe obesity due to excess calories with serious comorbidity and body mass index (BMI) of 38.0 to 38.9 in adult (HCC) 06/10/2017   Home Medication(s) Prior to Admission medications   Medication Sig Start Date End Date Taking? Authorizing Provider  Accu-Chek Softclix Lancets lancets TEST BLOOD SUGAR THREE TIMES DAILY AS DIRECTED 09/23/21   Nida, Gebreselassie W, MD  acetaminophen-codeine (TYLENOL #3) 300-30 MG tablet Take by mouth. 01/16/22   [provider]  albuterol (VENTOLIN HFA) 108 (90 Base) MCG/ACT inhaler Inhale 1-2 puffs into the lungs every 4 (four) hours as needed for wheezing or shortness of  breath.    [provider]  Alcohol Swabs (B-D SINGLE USE SWABS REGULAR) PADS Use before testing glucose and injecting Bydureon 01/08/22   Nida, Gebreselassie W, MD  ascorbic acid (VITAMIN C) 500 MG tablet Take 500 mg by mouth daily.    [provider]  aspirin EC 81 MG tablet Take 81 mg daily by mouth.    [provider]  B Complex Vitamins (VITAMIN B COMPLEX PO) Take 1 tablet by mouth daily.    [provider]  Blood Glucose Monitoring Suppl (ACCU-CHEK GUIDE) w/Device KIT Use to test BG tid. DX e11.65 12/04/20   Reardon, Whitney J, NP  BYDUREON BCISE 2 MG/0.85ML AUIJ INJECT 2MG ONCE WEEKLY 03/06/22   Nida, Gebreselassie W, MD  cholecalciferol (VITAMIN D3) 25 MCG (1000 UNIT) tablet Take 1,000 Units by mouth daily.    [provider]  diphenhydramine-acetaminophen (TYLENOL PM) 25-500 MG TABS tablet Take 1 tablet by mouth at bedtime as needed (sleep/pain).    [provider]  EPINEPHrine 0.3 mg/0.3 mL IJ SOAJ injection Inject 0.3 mg into the muscle as needed for anaphylaxis.    [provider]  gabapentin (NEURONTIN) 300 MG capsule Take 900 mg by mouth 2 (two) times daily.     [provider]  glucose blood (ACCU-CHEK GUIDE) test strip TEST BLOOD SUGAR THREE TIMES DAILY 12/02/21   Nida, Gebreselassie W, MD  hydrOXYzine (ATARAX/VISTARIL) 25 MG tablet Take 25 mg by mouth every 8 (eight) hours as needed for anxiety. 05/10/20   [provider]  hyoscyamine (LEVSIN SL)   0.125 MG SL tablet Place 1 tablet (0.125 mg total) under the tongue every 6 (six) hours as needed for cramping. 02/06/22   Carlan, Chelsea L, NP  lisinopril (ZESTRIL) 5 MG tablet TAKE ONE TABLET ONCE DAILY 01/02/22   Cassandria Anger, MD  Multiple Vitamin (MULTIVITAMIN) tablet Take 1 tablet daily by mouth.    [provider]  omeprazole (PRILOSEC) 20 MG capsule Take 20 mg by mouth in the morning and at bedtime.    [provider]  pramipexole  (MIRAPEX) 0.5 MG tablet Take 0.5 mg by mouth 3 (three) times daily. One tid For RLS    [provider]  pravastatin (PRAVACHOL) 40 MG tablet Take 40 mg by mouth at bedtime.    [provider]  propranolol (INDERAL) 10 MG tablet Take 10 mg by mouth 2 (two) times daily. 05/08/20   [provider]  topiramate (TOPAMAX) 25 MG tablet Take 25 mg by mouth 2 (two) times daily. 10/03/21   [provider]  traZODone (DESYREL) 50 MG tablet Take 50 mg by mouth at bedtime.    [provider]  venlafaxine XR (EFFEXOR-XR) 150 MG 24 hr capsule Take 150 mg by mouth daily. 09/21/21   [provider]                                                                                                                                    Past Surgical History Past Surgical History:  Procedure Laterality Date   ANKLE SURGERY Left    CARDIAC CATHETERIZATION     catherization     CERVICAL DISC SURGERY     CESAREAN SECTION     CHOLECYSTECTOMY     COLONOSCOPY WITH PROPOFOL N/A 11/20/2021   Procedure: COLONOSCOPY WITH PROPOFOL;  Surgeon: Harvel Quale, MD;  [facility]: [facility];  Service: Gastroenterology;  Laterality: N/A;  100   KNEE SURGERY     KNEE SURGERY     POLYPECTOMY  11/20/2021   Procedure: POLYPECTOMY;  Surgeon: Montez Morita, Quillian Quince, MD;  [facility]: [facility];  Service: Gastroenterology;;   TUBAL LIGATION     Family History Family History  Problem Relation Age of Onset   Cancer Mother    Heart failure Mother    Hyperlipidemia Mother    Diabetes Mother    Osteoarthritis Sister    Heart failure Brother     Social History Social History   Tobacco Use   Smoking status: Never    Passive exposure: Current   Smokeless tobacco: Never  Vaping Use   Vaping Use: Never used  Substance Use Topics   Alcohol use: No   Drug use: No   Allergies Bee venom  Review of Systems Review of Systems  All other systems reviewed and are  negative.   Physical Exam Vital Signs  I have reviewed the triage vital signs BP 100/63   Pulse 81   Temp (!)  97.5 F (36.4 C) (Oral)   Resp 16   Ht 5' 7" (1.702 m)   Wt 113.4 kg   LMP 03/19/2011   SpO2 95%   BMI 39.16 kg/m  Physical Exam Vitals and nursing note reviewed.  Constitutional:      General: She is not in acute distress.    Appearance: She is well-developed.  HENT:     Head: Normocephalic and atraumatic.     Mouth/Throat:     Mouth: Mucous membranes are moist.  Eyes:     Pupils: Pupils are equal, round, and reactive to light.  Cardiovascular:     Rate and Rhythm: Normal rate and regular rhythm.     Heart sounds: No murmur heard. Pulmonary:     Effort: Pulmonary effort is normal. No respiratory distress.     Breath sounds: Normal breath sounds.  Abdominal:     General: Abdomen is flat.     Palpations: Abdomen is soft.     Tenderness: There is no abdominal tenderness.  Musculoskeletal:        General: No tenderness.     Right lower leg: No edema.     Left lower leg: No edema.     Comments: Painful active range of motion of the left upper extremity at the shoulder, no tenderness or pain at the elbow or wrist.  2+ distal radial pulses bilaterally.  Normal radian, median, ulnar nerve sensation and strength at the left hand.  No overlying redness, swelling over the shoulder.  No rashes  Skin:    General: Skin is warm and dry.  Neurological:     General: No focal deficit present.     Mental Status: She is alert. Mental status is at baseline.  Psychiatric:        Mood and Affect: Mood normal.        Behavior: Behavior normal.     ED Results and Treatments Labs (all labs ordered are listed, but only abnormal results are displayed) Labs Reviewed  BASIC METABOLIC PANEL - Abnormal; Notable for the following components:      Result Value   Chloride 112 (*)    Glucose, Bld 151 (*)    All other components within normal limits  CBC - Abnormal; Notable for the  following components:   Hemoglobin 11.6 (*)    RDW 15.7 (*)    All other components within normal limits  TROPONIN I (HIGH SENSITIVITY)  TROPONIN I (HIGH SENSITIVITY)                                                                                                                          Radiology DG Shoulder Left  Result Date: 03/16/2022 CLINICAL DATA:  61-year-old female with history of left shoulder pain for 1 week. EXAM: LEFT SHOULDER - 2+ VIEW COMPARISON:  Left shoulder radiograph 05/15/2011. FINDINGS: There is no evidence of fracture or dislocation. There is no evidence of arthropathy or other focal bone abnormality. Soft tissues   are unremarkable. IMPRESSION: Negative. Electronically Signed   By: Daniel  Entrikin M.D.   On: 03/16/2022 10:29   DG Chest 2 View  Result Date: 03/16/2022 CLINICAL DATA:  Chest pain EXAM: CHEST - 2 VIEW COMPARISON:  08/30/2021 FINDINGS: The heart size and mediastinal contours are within normal limits. Aortic atherosclerosis. Both lungs are clear. The visualized skeletal structures are unremarkable. IMPRESSION: No active cardiopulmonary disease. Electronically Signed   By: Nicholas  Plundo D.O.   On: 03/16/2022 10:02    Pertinent labs & imaging results that were available during my care of the patient were reviewed by me and considered in my medical decision making (see MDM for details).  Medications Ordered in ED Medications  acetaminophen (TYLENOL) tablet 1,000 mg (1,000 mg Oral Given 03/16/22 1051)                                                                                                                                     Procedures Procedures  (including critical care time)  Medical Decision Making / ED Course   MDM:  61-year-old female presenting with shoulder pain.  On exam, patient has painful range of motion of the left shoulder.  Suspect rotator cuff tendinopathy given lack of trauma.  X-ray of the shoulder negative.  X-ray of the chest  negative.  Very low concern for ACS given musculoskeletal nature of her complaint and exam, but troponin negative and EKG not suspicious for active ischemia.  Doubt septic joint with no fevers, chills, able to range joint.  Discussed home pain control.  Advise close follow-up with primary physician.  Discussed possible physical therapy referral.  Discussed home shoulder exercises. Will discharge patient to home. All questions answered. Patient comfortable with plan of discharge. Return precautions discussed with patient and specified on the after visit summary.       Additional history obtained: -Additional history obtained from husband -External records from outside source obtained and reviewed including: Chart review including previous notes, labs, imaging, consultation notes   Lab Tests: -I ordered, reviewed, and interpreted labs.   The pertinent results include:   Labs Reviewed  BASIC METABOLIC PANEL - Abnormal; Notable for the following components:      Result Value   Chloride 112 (*)    Glucose, Bld 151 (*)    All other components within normal limits  CBC - Abnormal; Notable for the following components:   Hemoglobin 11.6 (*)    RDW 15.7 (*)    All other components within normal limits  TROPONIN I (HIGH SENSITIVITY)  TROPONIN I (HIGH SENSITIVITY)      EKG   EKG Interpretation  Date/Time:  Sunday March 16 2022 09:35:16 EDT Ventricular Rate:  78 PR Interval:  179 QRS Duration: 93 QT Interval:  404 QTC Calculation: 461 R Axis:   18 Text Interpretation: Sinus rhythm Low voltage, precordial leads Consider anterior infarct Confirmed by Scheving, William ([id number]) on 03/16/2022 10:19:02   AM         Imaging Studies ordered: I ordered imaging studies including x-ray chest, x-ray shoulder I independently visualized and interpreted imaging. I agree with the radiologist interpretation   Medicines ordered and prescription drug management: Meds ordered this encounter   Medications   acetaminophen (TYLENOL) tablet 1,000 mg    -I have reviewed the patients home medicines and have made adjustments as needed   Reevaluation: After the interventions noted above, I reevaluated the patient and found that they have :improved  Co morbidities that complicate the patient evaluation  Past Medical History:  Diagnosis Date   Abdominal pain 10/15/2017   Diabetes mellitus    Diabetes mellitus, type II (HCC)    H/O knee surgery    Hyperlipidemia    IBS (irritable bowel syndrome)       Dispostion: Discharge    Final Clinical Impression(s) / ED Diagnoses Final diagnoses:  Acute pain of left shoulder     This chart was dictated using voice recognition software.  Despite best efforts to proofread,  errors can occur which can change the documentation meaning.    Scheving, William L, MD 03/16/22 1114  

## 2022-03-16 NOTE — Discharge Instructions (Signed)
We evaluated you today in the emergency department for your shoulder pain.  Your testing was reassuring including your EKG test and your blood work including your cardiac enzymes.  Your x-rays were negative for fracture.  Your shoulder pain is likely due to a rotator cuff problem or sprain.  You can get this even if you did not fall on your shoulder.  Please follow-up closely with your primary doctor, as you may benefit from physical therapy.  You can take Tylenol and ibuprofen as needed for pain at home.  Please do some gentle shoulder exercises at home.  Please return to the emergency department if your symptoms worsen, or you develop any fevers, vomiting, trouble breathing, sweating, fainting, or any other new or concerning symptoms.

## 2022-03-25 ENCOUNTER — Other Ambulatory Visit: Payer: Self-pay | Admitting: "Endocrinology

## 2022-03-31 ENCOUNTER — Other Ambulatory Visit: Payer: Self-pay | Admitting: "Endocrinology

## 2022-05-03 LAB — LIPID PANEL
Chol/HDL Ratio: 3.1 ratio (ref 0.0–4.4)
Cholesterol, Total: 138 mg/dL (ref 100–199)
HDL: 44 mg/dL (ref >39)
LDL Chol Calc (NIH): 72 mg/dL (ref 0–99)
Triglycerides: 122 mg/dL (ref 0–149)
VLDL Cholesterol Cal: 22 mg/dL (ref 5–40)

## 2022-05-03 LAB — COMPREHENSIVE METABOLIC PANEL
ALT: 23 IU/L (ref 0–32)
AST: 23 IU/L (ref 0–40)
Albumin/Globulin Ratio: 1.5 (ref 1.2–2.2)
Albumin: 4.2 g/dL (ref 3.9–4.9)
Alkaline Phosphatase: 145 IU/L — ABNORMAL HIGH (ref 44–121)
BUN/Creatinine Ratio: 15 (ref 12–28)
BUN: 15 mg/dL (ref 8–27)
Bilirubin Total: 0.5 mg/dL (ref 0.0–1.2)
CO2: 22 mmol/L (ref 20–29)
Calcium: 9.6 mg/dL (ref 8.7–10.3)
Chloride: 106 mmol/L (ref 96–106)
Creatinine, Ser: 0.98 mg/dL (ref 0.57–1.00)
Globulin, Total: 2.8 g/dL (ref 1.5–4.5)
Glucose: 139 mg/dL — ABNORMAL HIGH (ref 70–99)
Potassium: 4.6 mmol/L (ref 3.5–5.2)
Sodium: 142 mmol/L (ref 134–144)
Total Protein: 7 g/dL (ref 6.0–8.5)
eGFR: 65 mL/min/{1.73_m2} (ref >59)

## 2022-05-03 LAB — TSH: TSH: 2.26 u[IU]/mL (ref 0.450–4.500)

## 2022-05-03 LAB — T4, FREE: Free T4: 1.04 ng/dL (ref 0.82–1.77)

## 2022-05-06 ENCOUNTER — Other Ambulatory Visit: Payer: Self-pay | Admitting: "Endocrinology

## 2022-05-12 ENCOUNTER — Ambulatory Visit: Payer: Medicare HMO | Admitting: "Endocrinology

## 2022-05-12 ENCOUNTER — Encounter: Payer: Self-pay | Admitting: "Endocrinology

## 2022-05-12 VITALS — BP 96/64 | HR 76 | Ht 68.0 in | Wt 252.4 lb

## 2022-05-12 DIAGNOSIS — E1165 Type 2 diabetes mellitus with hyperglycemia: Secondary | ICD-10-CM | POA: Diagnosis not present

## 2022-05-12 DIAGNOSIS — I10 Essential (primary) hypertension: Secondary | ICD-10-CM | POA: Diagnosis not present

## 2022-05-12 DIAGNOSIS — Z6838 Body mass index (BMI) 38.0-38.9, adult: Secondary | ICD-10-CM

## 2022-05-12 DIAGNOSIS — E782 Mixed hyperlipidemia: Secondary | ICD-10-CM | POA: Diagnosis not present

## 2022-05-12 LAB — POCT GLYCOSYLATED HEMOGLOBIN (HGB A1C): HbA1c, POC (controlled diabetic range): 6.3 % (ref 0.0–7.0)

## 2022-05-12 MED ORDER — ACCU-CHEK GUIDE W/DEVICE KIT: PACK | 0 refills | Status: AC

## 2022-05-12 NOTE — Progress Notes (Signed)
                                                              05/12/2022, 1:01 PM                Endocrinology follow-up note  Subjective:    Patient ID: Tina Sampson, female    DOB: 05/27/1961.  Tina Sampson is being seen in follow-up in the management of currently uncontrolled symptomatic type 2 diabetes, hyperlipidemia, hypertension. PMD:  Whiteheart, Kathryn A, NP.   Past Medical History:  Diagnosis Date   Abdominal pain 10/15/2017   Diabetes mellitus    Diabetes mellitus, type II (HCC)    H/O knee surgery    Hyperlipidemia    IBS (irritable bowel syndrome)    Past Surgical History:  Procedure Laterality Date   ANKLE SURGERY Left    CARDIAC CATHETERIZATION     catherization     CERVICAL DISC SURGERY     CESAREAN SECTION     CHOLECYSTECTOMY     COLONOSCOPY WITH PROPOFOL N/A 11/20/2021   Procedure: COLONOSCOPY WITH PROPOFOL;  Surgeon: Castaneda Mayorga, Daniel, MD;  Location: [facility];  Service: Gastroenterology;  Laterality: N/A;  100   KNEE SURGERY     KNEE SURGERY     POLYPECTOMY  11/20/2021   Procedure: POLYPECTOMY;  Surgeon: Castaneda Mayorga, Daniel, MD;  Location: [facility];  Service: Gastroenterology;;   TUBAL LIGATION     Social History   Socioeconomic History   Marital status: Married    Spouse name: Not on file   Number of children: Not on file   Years of education: Not on file   Highest education level: Not on file  Occupational History   Not on file  Tobacco Use   Smoking status: Never    Passive exposure: Current   Smokeless tobacco: Never  Vaping Use   Vaping Use: Never used  Substance and Sexual Activity   Alcohol use: No   Drug use: No   Sexual activity: Yes  Other Topics Concern   Not on file  Social History Narrative   Not on file   Social Determinants of Health   Financial Resource Strain: Not on file  Food Insecurity: Not on file  Transportation Needs: Not on file  Physical Activity: Not on  file  Stress: Not on file  Social Connections: Not on file   Outpatient Encounter Medications as of 05/12/2022  Medication Sig   Accu-Chek Softclix Lancets lancets TEST BLOOD SUGAR THREE TIMES DAILY AS DIRECTED   acetaminophen-codeine (TYLENOL #3) 300-30 MG tablet Take by mouth. (Patient not taking: Reported on 05/12/2022)   albuterol (VENTOLIN HFA) 108 (90 Base) MCG/ACT inhaler Inhale 1-2 puffs into the lungs every 4 (four) hours as needed for wheezing or shortness of breath.   Alcohol Swabs (DROPSAFE ALCOHOL PREP) 70 % PADS USE BEFORE TESTING GLUCOSE AND INJECTING BYDUREON   ascorbic acid (VITAMIN C) 500 MG tablet Take 500 mg by mouth daily.   aspirin EC 81 MG tablet Take 81 mg daily by mouth.   B Complex Vitamins (VITAMIN B COMPLEX PO) Take 1 tablet by mouth daily.   Blood Glucose Monitoring Suppl (ACCU-CHEK GUIDE) w/Device KIT Use to test BG tid. DX e11.65   BYDUREON BCISE   2 MG/0.85ML AUIJ INJECT 2MG ONCE WEEKLY   cholecalciferol (VITAMIN D3) 25 MCG (1000 UNIT) tablet Take 1,000 Units by mouth daily.   diphenhydramine-acetaminophen (TYLENOL PM) 25-500 MG TABS tablet Take 1 tablet by mouth at bedtime as needed (sleep/pain).   EPINEPHrine 0.3 mg/0.3 mL IJ SOAJ injection Inject 0.3 mg into the muscle as needed for anaphylaxis. (Patient not taking: Reported on 05/12/2022)   gabapentin (NEURONTIN) 300 MG capsule Take 900 mg by mouth 2 (two) times daily.    glucose blood (ACCU-CHEK GUIDE) test strip TEST BLOOD SUGAR THREE TIMES DAILY   hydrOXYzine (ATARAX/VISTARIL) 25 MG tablet Take 25 mg by mouth every 8 (eight) hours as needed for anxiety.   hyoscyamine (LEVSIN SL) 0.125 MG SL tablet Place 1 tablet (0.125 mg total) under the tongue every 6 (six) hours as needed for cramping.   lisinopril (ZESTRIL) 5 MG tablet TAKE ONE TABLET ONCE DAILY   Multiple Vitamin (MULTIVITAMIN) tablet Take 1 tablet daily by mouth.   omeprazole (PRILOSEC) 20 MG capsule Take 20 mg by mouth in the morning and at bedtime.    pramipexole (MIRAPEX) 0.5 MG tablet Take 0.5 mg by mouth 3 (three) times daily. One tid For RLS   pravastatin (PRAVACHOL) 40 MG tablet Take 40 mg by mouth at bedtime.   propranolol (INDERAL) 10 MG tablet Take 10 mg by mouth 2 (two) times daily.   topiramate (TOPAMAX) 25 MG tablet Take 25 mg by mouth 2 (two) times daily.   traZODone (DESYREL) 50 MG tablet Take 50 mg by mouth at bedtime.   venlafaxine XR (EFFEXOR-XR) 150 MG 24 hr capsule Take 150 mg by mouth daily.   [DISCONTINUED] Blood Glucose Monitoring Suppl (ACCU-CHEK GUIDE) w/Device KIT Use to test BG tid. DX e11.65   No facility-administered encounter medications on file as of 05/12/2022.    ALLERGIES: Allergies  Allergen Reactions   Bee Venom Shortness Of Breath, Itching and Swelling    VACCINATION STATUS: Immunization History  Administered Date(s) Administered   Influenza Split 05/07/2013   Moderna Sars-Covid-2 Vaccination 10/05/2019, 11/02/2019, 08/28/2020    Diabetes She presents for her follow-up diabetic visit. She has type 2 diabetes mellitus. Onset time: She was diagnosed at approximate age of 46 years. Her disease course has been improving. There are no hypoglycemic associated symptoms. Pertinent negatives for hypoglycemia include no confusion, headaches, pallor or seizures. Pertinent negatives for diabetes include no blurred vision, no chest pain, no polydipsia, no polyphagia, no polyuria and no weight loss. There are no hypoglycemic complications. Symptoms are improving. There are no diabetic complications. Risk factors for coronary artery disease include dyslipidemia, diabetes mellitus, obesity, sedentary lifestyle and post-menopausal. Current diabetic treatments: She is currently on Bydureon 2 mg subcutaneously weekly. Her weight is increasing steadily. She is following a generally unhealthy diet. When asked about meal planning, she reported none. She has not had a previous visit with a dietitian. She never participates in  exercise. Her home blood glucose trend is decreasing steadily. Her breakfast blood glucose range is generally 110-130 mg/dl. Her bedtime blood glucose range is generally 130-140 mg/dl. Her overall blood glucose range is 130-140 mg/dl. (She presents with controlled glycemic profile, averaging 125-135 over the last 30 days.  Her point-of-care A1c is 6.3%, overall improving from 7.3%.  She did not document any hypoglycemia.  ) An ACE inhibitor/angiotensin II receptor blocker is being taken. She does not see a podiatrist.Eye exam is current (She has not seen her ophthalmologist in 2 years, I urged to reschedule a  visit.).  Hyperlipidemia This is a chronic problem. The current episode started more than 1 year ago. The problem is controlled. Exacerbating diseases include diabetes and obesity. Pertinent negatives include no chest pain, myalgias or shortness of breath. Current antihyperlipidemic treatment includes statins. Risk factors for coronary artery disease include diabetes mellitus, dyslipidemia, obesity, a sedentary lifestyle and post-menopausal.     Review of systems  Constitutional: + Progressive weight loss,  current  Body mass index is 38.38 kg/m. , no fatigue, no subjective hyperthermia, no subjective hypothermia   Objective:    BP 96/64   Pulse 76   Ht 5' 8" (1.727 m)   Wt 252 lb 6.4 oz (114.5 kg)   LMP 03/19/2011   BMI 38.38 kg/m   Wt Readings from Last 3 Encounters:  05/12/22 252 lb 6.4 oz (114.5 kg)  03/16/22 250 lb (113.4 kg)  02/06/22 247 lb (112 kg)    Physical Exam- Limited  Constitutional:  Body mass index is 38.38 kg/m. , not in acute distress, normal state of mind   Recent Results (from the past 2160 hour(s))  Basic metabolic panel     Status: Abnormal   Collection Time: 03/16/22  9:45 AM  Result Value Ref Range   Sodium 141 135 - 145 mmol/L   Potassium 4.3 3.5 - 5.1 mmol/L   Chloride 112 (H) 98 - 111 mmol/L   CO2 22 22 - 32 mmol/L   Glucose, Bld 151 (H) 70 -  99 mg/dL    Comment: Glucose reference range applies only to samples taken after fasting for at least 8 hours.   BUN 18 8 - 23 mg/dL   Creatinine, Ser 0.90 0.44 - 1.00 mg/dL   Calcium 8.9 8.9 - 10.3 mg/dL   GFR, Estimated >60 >60 mL/min    Comment: (NOTE) Calculated using the CKD-EPI Creatinine Equation (2021)    Anion gap 7 5 - 15    Comment: Performed at [facility], [address] [place], [place] [address]  CBC     Status: Abnormal   Collection Time: 03/16/22  9:45 AM  Result Value Ref Range   WBC 5.3 4.0 - 10.5 K/uL   RBC 4.46 3.87 - 5.11 MIL/uL   Hemoglobin 11.6 (L) 12.0 - 15.0 g/dL   HCT 36.8 36.0 - 46.0 %   MCV 82.5 80.0 - 100.0 fL   MCH 26.0 26.0 - 34.0 pg   MCHC 31.5 30.0 - 36.0 g/dL   RDW 15.7 (H) 11.5 - 15.5 %   Platelets 207 150 - 400 K/uL   nRBC 0.0 0.0 - 0.2 %    Comment: Performed at [facility], [address] [place], [place] [address]  Troponin I (High Sensitivity)     Status: None   Collection Time: 03/16/22  9:45 AM  Result Value Ref Range   Troponin I (High Sensitivity) <2 <18 ng/L    Comment: (NOTE) Elevated high sensitivity troponin I (hsTnI) values and significant  changes across serial measurements may suggest ACS but many other  chronic and acute conditions are known to elevate hsTnI results.  Refer to the "Links" section for chest pain algorithms and additional  guidance. Performed at [facility], [address] [place], [place] [address]   Troponin I (High Sensitivity)     Status: None   Collection Time: 03/16/22 11:08 AM  Result Value Ref Range   Troponin I (High Sensitivity) <2 <18 ng/L    Comment: (NOTE) Elevated high sensitivity troponin I (hsTnI) values and   significant  changes across serial measurements may suggest ACS but many other  chronic and acute conditions are known to elevate hsTnI results.  Refer to the "Links" section for chest pain algorithms and additional  guidance. Performed at [facility], [address],  [place], [place] [address]   Comprehensive metabolic panel     Status: Abnormal   Collection Time: 05/02/22 11:07 AM  Result Value Ref Range   Glucose 139 (H) 70 - 99 mg/dL   BUN 15 8 - 27 mg/dL   Creatinine, Ser 0.98 0.57 - 1.00 mg/dL   eGFR 65 >59 mL/min/1.73   BUN/Creatinine Ratio 15 12 - 28   Sodium 142 134 - 144 mmol/L   Potassium 4.6 3.5 - 5.2 mmol/L   Chloride 106 96 - 106 mmol/L   CO2 22 20 - 29 mmol/L   Calcium 9.6 8.7 - 10.3 mg/dL   Total Protein 7.0 6.0 - 8.5 g/dL   Albumin 4.2 3.9 - 4.9 g/dL   Globulin, Total 2.8 1.5 - 4.5 g/dL   Albumin/Globulin Ratio 1.5 1.2 - 2.2   Bilirubin Total 0.5 0.0 - 1.2 mg/dL   Alkaline Phosphatase 145 (H) 44 - 121 IU/L   AST 23 0 - 40 IU/L   ALT 23 0 - 32 IU/L  Lipid panel     Status: None   Collection Time: 05/02/22 11:07 AM  Result Value Ref Range   Cholesterol, Total 138 100 - 199 mg/dL   Triglycerides 122 0 - 149 mg/dL   HDL 44 >39 mg/dL   VLDL Cholesterol Cal 22 5 - 40 mg/dL   LDL Chol Calc (NIH) 72 0 - 99 mg/dL   Chol/HDL Ratio 3.1 0.0 - 4.4 ratio    Comment:                                   T. Chol/HDL Ratio                                             Men  Women                               1/2 Avg.Risk  3.4    3.3                                   Avg.Risk  5.0    4.4                                2X Avg.Risk  9.6    7.1                                3X Avg.Risk 23.4   11.0   TSH     Status: None   Collection Time: 05/02/22 11:07 AM  Result Value Ref Range   TSH 2.260 0.450 - 4.500 uIU/mL  T4, free     Status: None   Collection Time: 05/02/22 11:07 AM  Result Value Ref Range   Free T4 1.04 0.82 - 1.77 ng/dL  HgB A1c  Status: None   Collection Time: 05/12/22  9:16 AM  Result Value Ref Range   Hemoglobin A1C     HbA1c POC (<> result, manual entry)     HbA1c, POC (prediabetic range)     HbA1c, POC (controlled diabetic range) 6.3 0.0 - 7.0 %    Lipid Panel     Component Value Date/Time   CHOL 138 05/02/2022 1107    TRIG 122 05/02/2022 1107   HDL 44 05/02/2022 1107   CHOLHDL 3.1 05/02/2022 1107   CHOLHDL 3.7 01/13/2020 1112   LDLCALC 72 05/02/2022 1107   LDLCALC 71 01/13/2020 1112    Assessment & Plan:   1. Uncontrolled type 2 diabetes mellitus with hyperglycemia (HCC)  - Tina Sampson has currently uncontrolled symptomatic type 2 DM since  61 years of age.  She presents with controlled glycemic profile, averaging 125-135 over the last 30 days.  Her point-of-care A1c is 6.3%, overall improving from 7.3%.  She did not document any hypoglycemia.   Recent labs reviewed.  -her diabetes is complicated by obesity/sedentary life and Tina Sampson remains at a high risk for more acute and chronic complications which include CAD, CVA, CKD, retinopathy, and neuropathy. These are all discussed in detail with the patient.  - I have counseled her on diet management and weight loss, by adopting a carbohydrate restricted/protein rich diet. - she acknowledges that there is a room for improvement in her food and drink choices. - Suggestion is made for her to avoid simple carbohydrates  from her diet including Cakes, Sweet Desserts, Ice Cream, Soda (diet and regular), Sweet Tea, Candies, Chips, Cookies, Store Bought Juices, Alcohol in Excess of  1-2 drinks a day, Artificial Sweeteners,  Coffee Creamer, and "Sugar-free" Products, Lemonade. This will help patient to have more stable blood glucose profile and potentially avoid unintended weight gain.  - I encouraged her to switch to  unprocessed or minimally processed complex starch and increased protein intake (animal or plant source), fruits, and vegetables.  - she is advised to stick to a routine mealtimes to eat 3 meals  a day and avoid unnecessary snacks ( to snack only to correct hypoglycemia).   - I have approached her with the following individualized plan to manage diabetes and patient agrees:   -In light of her presentation with controlled glycemic  profile with A1c of 6.3%, she will not need insulin treatment for now.     She is responding to the weekly injection of Bydureon.  She is advised to continue Bydureon 2 mg subcutaneously weekly.   She is advised and willing to continue monitoring blood glucose at least once a day before breakfast.    She will be reconsidered for low-dose metformin during her next visit if A1c increases to above 7%.        2) BP/HTN -Her blood pressure is tightly controlled to target.  She reports systolic blood pressures in the 80s and 90s at home.  She is advised to hold her lisinopril until next visit.      3) Lipids/HPL:   Her most recent lipid panel showed controlled LDL at 65.  She is advised to continue pravastatin 40 mg p.o. nightly.   Side effects and precautions discussed with her.  4) weight management: Her BMI is 38.38-, gaining weight progressively.  No further success after she lost 20 pounds prior to her last visit.    She is a candidate for  more modest weight loss.   Whole   food plant-based nutrition is discussed with her.  5) Chronic Care/Health Maintenance:  -she  is on  Statin medications and   lisinopril is encouraged to continue to follow up with Ophthalmology, Dentist,  Podiatrist at least yearly or according to recommendations, and advised to  stay away from smoking. I have recommended yearly flu vaccine and pneumonia vaccination at least every 5 years; moderate intensity exercise for up to 150 minutes weekly; and  sleep for at least 7 hours a day.  Her screen ABI was abnormal in March 2022.  She was evaluated by vascular surgery with better testing showing no evidence of occlusive peripheral arterial disease.    - I advised patient to maintain close follow up with Marijean Heath, NP for primary care needs.   I spent 32 minutes in the care of the patient today including review of labs from [place], Lipids, Thyroid Function, Hematology (current and previous including abstractions  from other facilities); face-to-face time discussing  her blood glucose readings/logs, discussing hypoglycemia and hyperglycemia episodes and symptoms, medications doses, her options of short and long term treatment based on the latest standards of care / guidelines;  discussion about incorporating lifestyle medicine;  and documenting the encounter. Risk reduction counseling performed per USPSTF guidelines to reduce  obesity and cardiovascular risk factors.     Please refer to Patient Instructions for Blood Glucose Monitoring and Insulin/Medications Dosing Guide"  in media tab for additional information. Please  also refer to " Patient Self Inventory" in the Media  tab for reviewed elements of pertinent patient history.  [name] participated in the discussions, expressed understanding, and voiced agreement with the above plans.  All questions were answered to her satisfaction. she is encouraged to contact clinic should she have any questions or concerns prior to her return visit.    Follow up plan: - Return in about 3 months (around 08/12/2022) for Bring Meter/CGM Device/Logs- A1c in Office.  Glade Lloyd, MD [facility] [address] [place], [place] [address] Phone: [phone]  Fax: [phone]    05/12/2022, 1:01 PM  This note was partially dictated with voice recognition software. Similar sounding words can be transcribed inadequately or may not  be corrected upon review.

## 2022-05-12 NOTE — Patient Instructions (Signed)
                                     Advice for Weight Management  -For most of us the best way to lose weight is by diet management. Generally speaking, diet management means consuming less calories intentionally which over time brings about progressive weight loss.  This can be achieved more effectively by avoiding ultra processed carbohydrates, processed meats, unhealthy fats.    It is critically important to know your numbers: how much calorie you are consuming and how much calorie you need. More importantly, our carbohydrates sources should be unprocessed naturally occurring  complex starch food items.  It is always important to balance nutrition also by  appropriate intake of proteins (mainly plant-based), healthy fats/oils, plenty of fruits and vegetables.   -The American College of Lifestyle Medicine (ACL M) recommends nutrition derived mostly from Whole Food, Plant Predominant Sources example an apple instead of applesauce or apple pie. Eat Plenty of vegetables, Mushrooms, fruits, Legumes, Whole Grains, Nuts, seeds in lieu of processed meats, processed snacks/pastries red meat, poultry, eggs.  Use only water or unsweetened tea for hydration.  The College also recommends the need to stay away from risky substances including alcohol, smoking; obtaining 7-9 hours of restorative sleep, at least 150 minutes of moderate intensity exercise weekly, importance of healthy social connections, and being mindful of stress and seek help when it is overwhelming.    -Sticking to a routine mealtime to eat 3 meals a day and avoiding unnecessary snacks is shown to have a big role in weight control. Under normal circumstances, the only time we burn stored energy is when we are hungry, so allow  some hunger to take place- hunger means no food between appropriate meal times, only water.  It is not advisable to starve.   -It is better to avoid simple carbohydrates including:  Cakes, Sweet Desserts, Ice Cream, Soda (diet and regular), Sweet Tea, Candies, Chips, Cookies, Store Bought Juices, Alcohol in Excess of  1-2 drinks a day, Lemonade,  Artificial Sweeteners, Doughnuts, Coffee Creamers, "Sugar-free" Products, etc, etc.  This is not a complete list.....    -Consulting with certified diabetes educators is proven to provide you with the most accurate and current information on diet.  Also, you may be  interested in discussing diet options/exchanges , we can schedule a visit with Tina Sampson, RDN, CDE for individualized nutrition education.  -Exercise: If you are able: 30 -60 minutes a day ,4 days a week, or 150 minutes of moderate intensity exercise weekly.    The longer the better if tolerated.  Combine stretch, strength, and aerobic activities.  If you were told in the past that you have high risk for cardiovascular diseases, or if you are currently symptomatic, you may seek evaluation by your heart doctor prior to initiating moderate to intense exercise programs.                                  Additional Care Considerations for Diabetes/Prediabetes   -Diabetes  is a chronic disease.  The most important care consideration is regular follow-up with your diabetes care provider with the goal being avoiding or delaying its complications and to take advantage of advances in medications and technology.  If appropriate actions are taken early enough, type 2 diabetes can even be   reversed.  Seek information from the right source.  - Whole Food, Plant Predominant Nutrition is highly recommended: Eat Plenty of vegetables, Mushrooms, fruits, Legumes, Whole Grains, Nuts, seeds in lieu of processed meats, processed snacks/pastries red meat, poultry, eggs as recommended by American College of  Lifestyle Medicine (ACLM).  -Type 2 diabetes is known to coexist with other important comorbidities such as high blood pressure and high cholesterol.  It is critical to control not only the  diabetes but also the high blood pressure and high cholesterol to minimize and delay the risk of complications including coronary artery disease, stroke, amputations, blindness, etc.  The good news is that this diet recommendation for type 2 diabetes is also very helpful for managing high cholesterol and high blood blood pressure.  - Studies showed that people with diabetes will benefit from a class of medications known as ACE inhibitors and statins.  Unless there are specific reasons not to be on these medications, the standard of care is to consider getting one from these groups of medications at an optimal doses.  These medications are generally considered safe and proven to help protect the heart and the kidneys.    - People with diabetes are encouraged to initiate and maintain regular follow-up with eye doctors, foot doctors, dentists , and if necessary heart and kidney doctors.     - It is highly recommended that people with diabetes quit smoking or stay away from smoking, and get yearly  flu vaccine and pneumonia vaccine at least every 5 years.  See above for additional recommendations on exercise, sleep, stress management , and healthy social connections.      

## 2022-05-13 ENCOUNTER — Encounter: Payer: Self-pay | Admitting: Physical Therapy

## 2022-05-13 ENCOUNTER — Other Ambulatory Visit: Payer: Self-pay

## 2022-05-13 ENCOUNTER — Ambulatory Visit: Payer: Medicare HMO | Attending: Neurological Surgery | Admitting: Physical Therapy

## 2022-05-13 DIAGNOSIS — M542 Cervicalgia: Secondary | ICD-10-CM | POA: Insufficient documentation

## 2022-05-13 DIAGNOSIS — M6281 Muscle weakness (generalized): Secondary | ICD-10-CM | POA: Insufficient documentation

## 2022-05-13 DIAGNOSIS — R293 Abnormal posture: Secondary | ICD-10-CM | POA: Insufficient documentation
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
## 2022-05-15 ENCOUNTER — Encounter: Payer: Self-pay | Admitting: Physical Therapy

## 2022-05-15 ENCOUNTER — Ambulatory Visit: Payer: Medicare HMO | Admitting: Physical Therapy

## 2022-05-15 DIAGNOSIS — M542 Cervicalgia: Secondary | ICD-10-CM | POA: Diagnosis not present

## 2022-05-15 DIAGNOSIS — M6281 Muscle weakness (generalized): Secondary | ICD-10-CM

## 2022-05-15 DIAGNOSIS — R293 Abnormal posture: Secondary | ICD-10-CM
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
## 2022-05-19 ENCOUNTER — Ambulatory Visit: Payer: Medicare HMO | Admitting: Physical Therapy

## 2022-05-19 DIAGNOSIS — M542 Cervicalgia: Secondary | ICD-10-CM

## 2022-05-19 DIAGNOSIS — R293 Abnormal posture: Secondary | ICD-10-CM
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
## 2022-05-22 ENCOUNTER — Encounter: Payer: Medicare HMO | Admitting: Physical Therapy

## 2022-05-26 ENCOUNTER — Ambulatory Visit: Payer: Medicare HMO | Admitting: Physical Therapy

## 2022-05-26 DIAGNOSIS — R293 Abnormal posture: Secondary | ICD-10-CM

## 2022-05-26 DIAGNOSIS — M542 Cervicalgia: Secondary | ICD-10-CM

## 2022-05-26 DIAGNOSIS — M6281 Muscle weakness (generalized): Secondary | ICD-10-CM
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
## 2022-05-29 ENCOUNTER — Ambulatory Visit: Payer: Medicare HMO | Admitting: *Deleted

## 2022-05-29 DIAGNOSIS — M6281 Muscle weakness (generalized): Secondary | ICD-10-CM

## 2022-05-29 DIAGNOSIS — M542 Cervicalgia: Secondary | ICD-10-CM | POA: Diagnosis not present

## 2022-05-29 DIAGNOSIS — R293 Abnormal posture: Secondary | ICD-10-CM
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
## 2022-06-02 ENCOUNTER — Ambulatory Visit: Payer: Medicare HMO | Admitting: Physical Therapy

## 2022-06-02 DIAGNOSIS — M6281 Muscle weakness (generalized): Secondary | ICD-10-CM

## 2022-06-02 DIAGNOSIS — R293 Abnormal posture: Secondary | ICD-10-CM

## 2022-06-02 DIAGNOSIS — M542 Cervicalgia: Secondary | ICD-10-CM
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
## 2022-06-05 ENCOUNTER — Other Ambulatory Visit: Payer: Self-pay | Admitting: "Endocrinology

## 2022-06-05 ENCOUNTER — Ambulatory Visit: Payer: Medicare HMO | Admitting: *Deleted

## 2022-06-09 ENCOUNTER — Encounter: Payer: Medicare HMO | Admitting: Physical Therapy

## 2022-06-10 ENCOUNTER — Encounter: Payer: Self-pay | Admitting: Physical Therapy

## 2022-06-10 ENCOUNTER — Ambulatory Visit: Payer: Medicare HMO | Attending: Neurological Surgery | Admitting: Physical Therapy

## 2022-06-10 ENCOUNTER — Telehealth: Payer: Self-pay | Admitting: "Endocrinology

## 2022-06-10 DIAGNOSIS — M542 Cervicalgia: Secondary | ICD-10-CM | POA: Insufficient documentation

## 2022-06-10 DIAGNOSIS — R293 Abnormal posture: Secondary | ICD-10-CM | POA: Insufficient documentation

## 2022-06-10 NOTE — Telephone Encounter (Signed)
We do not have any samples. Is there a substitute we can replace it with?
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
## 2022-06-10 NOTE — Telephone Encounter (Signed)
New message   Patient calling the office for samples of medication:   1.  What medication and dosage are you requesting samples for?BYDUREON BCISE 2 MG/0.85ML AUIJ   2.  Are you currently out of this medication? The cost is over  $ 200.00 patient does not have the money right now.

## 2022-06-11 NOTE — Telephone Encounter (Signed)
Left a message requesting pt return call to the office. ?

## 2022-06-12 ENCOUNTER — Encounter: Payer: Medicare HMO | Admitting: Physical Therapy

## 2022-06-12 NOTE — Telephone Encounter (Signed)
Pt left a VM returning your call

## 2022-06-12 NOTE — Telephone Encounter (Signed)
Left a message requesting pt return call to the office. ?

## 2022-06-13 NOTE — Telephone Encounter (Signed)
Left a message making pt aware to come by the office for a sample of ozempic '2mg'$ .

## 2022-08-06 ENCOUNTER — Telehealth: Payer: Self-pay

## 2022-08-06 ENCOUNTER — Other Ambulatory Visit: Payer: Self-pay | Admitting: "Endocrinology

## 2022-08-06 MED ORDER — GLIPIZIDE ER 5 MG PO TB24: 5.0000 mg | ORAL_TABLET | Freq: Every day | ORAL | 0 refills | Status: DC

## 2022-08-06 NOTE — Telephone Encounter (Signed)
Pt called stating she was seen by Dr.Brumfield in [facility] yesterday and was given a steroid injection. This morning her BG was 304 and before lunch it was 360. Pt takes ozempic '2mg'$  weekly no other diabetic medication.

## 2022-08-07 NOTE — Telephone Encounter (Signed)
Discussed with pt, understanding voiced. 

## 2022-08-12 ENCOUNTER — Ambulatory Visit: Payer: Medicare HMO | Admitting: "Endocrinology

## 2022-08-12 ENCOUNTER — Encounter: Payer: Self-pay | Admitting: "Endocrinology

## 2022-08-12 VITALS — BP 116/78 | HR 80 | Ht 68.0 in | Wt 264.4 lb

## 2022-08-12 DIAGNOSIS — E782 Mixed hyperlipidemia: Secondary | ICD-10-CM

## 2022-08-12 DIAGNOSIS — Z6838 Body mass index (BMI) 38.0-38.9, adult: Secondary | ICD-10-CM

## 2022-08-12 DIAGNOSIS — I10 Essential (primary) hypertension: Secondary | ICD-10-CM | POA: Diagnosis not present

## 2022-08-12 DIAGNOSIS — E1165 Type 2 diabetes mellitus with hyperglycemia: Secondary | ICD-10-CM | POA: Diagnosis not present

## 2022-08-12 MED ORDER — SEMAGLUTIDE (1 MG/DOSE) 4 MG/3ML ~~LOC~~ SOPN: 1.0000 mg | PEN_INJECTOR | SUBCUTANEOUS | 2 refills | Status: DC

## 2022-08-12 NOTE — Progress Notes (Signed)
08/12/2022, 4:11 PM                Endocrinology follow-up note  Subjective:    Patient ID: Tina Sampson, female    DOB: [birth year].  Tina Sampson is being seen in follow-up in the management of currently uncontrolled symptomatic type 2 diabetes, hyperlipidemia, hypertension. PMD:  Marijean Heath, NP.   Past Medical History:  Diagnosis Date   Abdominal pain 10/15/2017   Diabetes mellitus    Diabetes mellitus, type II ([place])    H/O knee surgery    Hyperlipidemia    IBS (irritable bowel syndrome)    Past Surgical History:  Procedure Laterality Date   ANKLE SURGERY Left    CARDIAC CATHETERIZATION     catherization     CERVICAL DISC SURGERY     CESAREAN SECTION     CHOLECYSTECTOMY     COLONOSCOPY WITH PROPOFOL N/A 11/20/2021   Procedure: COLONOSCOPY WITH PROPOFOL;  Surgeon: Harvel Quale, MD;  [facility];  Service: Gastroenterology;  Laterality: N/A;  100   KNEE SURGERY     KNEE SURGERY     POLYPECTOMY  11/20/2021   Procedure: POLYPECTOMY;  Surgeon: Montez Morita, Quillian Quince, MD;  [facility];  Service: Gastroenterology;;   TUBAL LIGATION     Social History   Socioeconomic History   Marital status: Married    Spouse name: Not on file   Number of children: Not on file   Years of education: Not on file   Highest education level: Not on file  Occupational History   Not on file  Tobacco Use   Smoking status: Never    Passive exposure: Current   Smokeless tobacco: Never  Vaping Use   Vaping Use: Never used  Substance and Sexual Activity   Alcohol use: No   Drug use: No   Sexual activity: Yes  Other Topics Concern   Not on file  Social History Narrative   Not on file   Social Determinants of Health   Financial Resource Strain: Not on file  Food Insecurity: Not on file  Transportation Needs: Not on file  Physical Activity: Not on  file  Stress: Not on file  Social Connections: Not on file   Outpatient Encounter Medications as of 08/12/2022  Medication Sig   Semaglutide, 1 MG/DOSE, 4 MG/3ML SOPN Inject 1 mg as directed once a week.   Accu-Chek Softclix Lancets lancets TEST BLOOD SUGAR THREE TIMES DAILY AS DIRECTED   acetaminophen-codeine (TYLENOL #3) 300-30 MG tablet Take by mouth. (Patient not taking: Reported on 05/12/2022)   albuterol (VENTOLIN HFA) 108 (90 Base) MCG/ACT inhaler Inhale 1-2 puffs into the lungs every 4 (four) hours as needed for wheezing or shortness of breath.   Alcohol Swabs (DROPSAFE ALCOHOL PREP) 70 % PADS USE BEFORE TESTING GLUCOSE AND INJECTING BYDUREON   ascorbic acid (VITAMIN C) 500 MG tablet Take 500 mg by mouth daily.   aspirin EC 81 MG tablet Take 81 mg daily by mouth.   B Complex Vitamins (VITAMIN B COMPLEX PO) Take 1 tablet by mouth daily.   Blood Glucose Monitoring  Suppl (ACCU-CHEK GUIDE) w/Device KIT Use to test BG tid. DX e11.65   cholecalciferol (VITAMIN D3) 25 MCG (1000 UNIT) tablet Take 1,000 Units by mouth daily.   diphenhydramine-acetaminophen (TYLENOL PM) 25-500 MG TABS tablet Take 1 tablet by mouth at bedtime as needed (sleep/pain).   EPINEPHrine 0.3 mg/0.3 mL IJ SOAJ injection Inject 0.3 mg into the muscle as needed for anaphylaxis. (Patient not taking: Reported on 05/12/2022)   gabapentin (NEURONTIN) 300 MG capsule Take 900 mg by mouth 2 (two) times daily.    glipiZIDE (GLUCOTROL XL) 5 MG 24 hr tablet Take 1 tablet (5 mg total) by mouth daily with breakfast.   glucose blood (ACCU-CHEK GUIDE) test strip TEST BLOOD SUGAR THREE TIMES DAILY   hydrOXYzine (ATARAX/VISTARIL) 25 MG tablet Take 25 mg by mouth every 8 (eight) hours as needed for anxiety.   hyoscyamine (LEVSIN SL) 0.125 MG SL tablet Place 1 tablet (0.125 mg total) under the tongue every 6 (six) hours as needed for cramping.   lisinopril (ZESTRIL) 5 MG tablet TAKE ONE TABLET ONCE DAILY   Multiple Vitamin (MULTIVITAMIN) tablet  Take 1 tablet daily by mouth.   omeprazole (PRILOSEC) 20 MG capsule Take 20 mg by mouth in the morning and at bedtime.   pramipexole (MIRAPEX) 0.5 MG tablet Take 0.5 mg by mouth 3 (three) times daily. One tid For RLS   pravastatin (PRAVACHOL) 40 MG tablet Take 40 mg by mouth at bedtime.   propranolol (INDERAL) 10 MG tablet Take 10 mg by mouth 2 (two) times daily.   topiramate (TOPAMAX) 25 MG tablet Take 25 mg by mouth 2 (two) times daily.   traZODone (DESYREL) 50 MG tablet Take 50 mg by mouth at bedtime.   venlafaxine XR (EFFEXOR-XR) 150 MG 24 hr capsule Take 150 mg by mouth daily.   [DISCONTINUED] BYDUREON BCISE 2 MG/0.85ML AUIJ INJECT '2MG'$  ONCE WEEKLY (Patient not taking: Reported on 08/12/2022)   No facility-administered encounter medications on file as of 08/12/2022.    ALLERGIES: Allergies  Allergen Reactions   Bee Venom Shortness Of Breath, Itching and Swelling    VACCINATION STATUS: Immunization History  Administered Date(s) Administered   Influenza Split 05/07/2013   Moderna Sars-Covid-2 Vaccination 10/05/2019, 11/02/2019, 08/28/2020    Diabetes She presents for her follow-up diabetic visit. She has type 2 diabetes mellitus. Onset time: She was diagnosed at approximate age of 42 years. Her disease course has been stable. There are no hypoglycemic associated symptoms. Pertinent negatives for hypoglycemia include no confusion, headaches, pallor or seizures. Pertinent negatives for diabetes include no blurred vision, no chest pain, no polydipsia, no polyphagia, no polyuria and no weight loss. There are no hypoglycemic complications. Symptoms are improving. There are no diabetic complications. Risk factors for coronary artery disease include dyslipidemia, diabetes mellitus, obesity, sedentary lifestyle and post-menopausal. Current diabetic treatments: She is currently on Bydureon 2 mg subcutaneously weekly. Her weight is increasing steadily. She is following a generally unhealthy diet. When  asked about meal planning, she reported none. She has not had a previous visit with a dietitian. She never participates in exercise. There is no change in her home blood glucose trend. Her breakfast blood glucose range is generally 110-130 mg/dl. Her bedtime blood glucose range is generally 130-140 mg/dl. Her overall blood glucose range is 130-140 mg/dl. (She presents with controlled glycemic profile, averaging 125-135 over the last 30 days.  Her point-of-care A1c is 6.3%, overall improving from 7.3%.  She did not document any hypoglycemia.  ) An ACE inhibitor/angiotensin II  receptor blocker is being taken. She does not see a podiatrist.Eye exam is current (She has not seen her ophthalmologist in 2 years, I urged to reschedule a visit.).  Hyperlipidemia This is a chronic problem. The current episode started more than 1 year ago. The problem is controlled. Exacerbating diseases include diabetes and obesity. Pertinent negatives include no chest pain, myalgias or shortness of breath. Current antihyperlipidemic treatment includes statins. Risk factors for coronary artery disease include diabetes mellitus, dyslipidemia, obesity, a sedentary lifestyle and post-menopausal.     Review of systems  Constitutional: + Progressive weight loss,  current  Body mass index is 40.2 kg/m. , no fatigue, no subjective hyperthermia, no subjective hypothermia   Objective:    BP 116/78   Pulse 80   Ht '5\' 8"'$  (1.727 m)   Wt 264 lb 6.4 oz (119.9 kg)   LMP 03/19/2011   BMI 40.20 kg/m   Wt Readings from Last 3 Encounters:  08/12/22 264 lb 6.4 oz (119.9 kg)  05/12/22 252 lb 6.4 oz (114.5 kg)  03/16/22 250 lb (113.4 kg)    Physical Exam- Limited  Constitutional:  Body mass index is 40.2 kg/m. , not in acute distress, normal state of mind   No results found for this or any previous visit (from the past 2160 hour(s)).   Lipid Panel     Component Value Date/Time   CHOL 138 05/02/2022 1107   TRIG 122  05/02/2022 1107   HDL 44 05/02/2022 1107   CHOLHDL 3.1 05/02/2022 1107   CHOLHDL 3.7 01/13/2020 1112   LDLCALC 72 05/02/2022 1107   LDLCALC 71 01/13/2020 1112    Assessment & Plan:   1. Uncontrolled type 2 diabetes mellitus with hyperglycemia ([place])  - Tina Sampson has currently uncontrolled symptomatic type 2 DM since  62 years of age.  She presents with controlled glycemic profile, point-of-care A1c of 6.3% overall improving.  She did not document any hypoglycemia.      Recent labs reviewed.  -her diabetes is complicated by obesity/sedentary life and Tina Sampson remains at a high risk for more acute and chronic complications which include CAD, CVA, CKD, retinopathy, and neuropathy. These are all discussed in detail with the patient.  - I have counseled her on diet management and weight loss, by adopting a carbohydrate restricted/protein rich diet. In light of her multiple manifestations of metabolic syndrome, she is a good candidate for lifestyle medicine. - she acknowledges that there is a room for improvement in her food and drink choices. - Suggestion is made for her to avoid simple carbohydrates  from her diet including Cakes, Sweet Desserts, Ice Cream, Soda (diet and regular), Sweet Tea, Candies, Chips, Cookies, Store Bought Juices, Alcohol , Artificial Sweeteners,  Coffee Creamer, and "Sugar-free" Products, Lemonade. This will help patient to have more stable blood glucose profile and potentially avoid unintended weight gain.  The following Lifestyle Medicine recommendations according to [facility]) were discussed and and offered to patient and she  agrees to start the journey:  A. Whole Foods, Plant-Based Nutrition comprising of fruits and vegetables, plant-based proteins, whole-grain carbohydrates was discussed in detail with the patient.   A list for source of those nutrients were also provided to the patient.  Patient will use only  water or unsweetened tea for hydration. B.  The need to stay away from risky substances including alcohol, smoking; obtaining 7 to 9 hours of restorative sleep, at least 150 minutes of  moderate intensity exercise weekly, the importance of healthy social connections,  and stress management techniques were discussed. C.  A full color page of  Calorie density of various food groups per pound showing examples of each food groups was provided to the patient.  - I encouraged her to switch to  unprocessed or minimally processed complex starch and increased protein intake (animal or plant source), fruits, and vegetables.   - I have approached her with the following individualized plan to manage diabetes and patient agrees:   -In light of her presentation with controlled glycemic profile with A1c of 6.3%, she will not need insulin treatment for now.   In the interval, she lost coverage of Trulicity via her insurance.  She tried a sample of Ozempic from clinic and she tolerated that.  I discussed and prescribed Ozempic 1 mg subcutaneously weekly.  Side effects and precautions discussed with her.   She will also be reconsidered for low-dose metformin during her next visit if A1c increases to above 7%.        2) BP/HTN -Her blood pressure is controlled to target.  She reports systolic blood pressures in the 80s and 90s at home.  She is advised to hold her lisinopril until next visit.      3) Lipids/HPL:   Her most recent lipid panel showed controlled LDL at 65.  She is advised to continue pravastatin 40 mg p.o. nightly.  Side effects and precautions discussed with her.     4) weight management: Her BMI is 40.2-re gaining most of the weight she has lost due to her recent surgery.   She will benefit from the GLP-1 receptor agonists in whole food plant-based diet discussed and recommended above.  5) Chronic Care/Health Maintenance:  -she  is on  Statin medications and   lisinopril is encouraged to  continue to follow up with Ophthalmology, Dentist,  Podiatrist at least yearly or according to recommendations, and advised to  stay away from smoking. I have recommended yearly flu vaccine and pneumonia vaccination at least every 5 years; moderate intensity exercise for up to 150 minutes weekly; and  sleep for at least 7 hours a day.  Her screen ABI was abnormal in March 2022.  She was evaluated by vascular surgery with better testing showing no evidence of occlusive peripheral arterial disease.    - I advised patient to maintain close follow up with Marijean Heath, NP for primary care needs.    I spent 28 minutes in the care of the patient today including review of labs from [place], Lipids, Thyroid Function, Hematology (current and previous including abstractions from other facilities); face-to-face time discussing  her blood glucose readings/logs, discussing hypoglycemia and hyperglycemia episodes and symptoms, medications doses, her options of short and long term treatment based on the latest standards of care / guidelines;  discussion about incorporating lifestyle medicine;  and documenting the encounter. Risk reduction counseling performed per USPSTF guidelines to reduce  obesity and cardiovascular risk factors.     Please refer to Patient Instructions for Blood Glucose Monitoring and Insulin/Medications Dosing Guide"  in media tab for additional information. Please  also refer to " Patient Self Inventory" in the Media  tab for reviewed elements of pertinent patient history.  Tina Sampson participated in the discussions, expressed understanding, and voiced agreement with the above plans.  All questions were answered to her satisfaction. she is encouraged to contact clinic should she have any questions or concerns prior to her  return visit.    Follow up plan: - Return in about 3 months (around 11/11/2022) for F/U with Pre-visit Labs, Meter/CGM/Logs, A1c here.  Glade Lloyd, MD [facility] [address] [place], [place] [address] Phone: [phone]  Fax: [phone]    08/12/2022, 4:11 PM  This note was partially dictated with voice recognition software. Similar sounding words can be transcribed inadequately or may not  be corrected upon review.

## 2022-08-12 NOTE — Patient Instructions (Signed)

## 2022-08-16 IMAGING — CT CT ABD-PELV W/ CM
2 of 5 series · 15 of 46 positions shown, 17 images · IV contrast (APPLIED)
Comparison: Pelvis CT 08/24/2021, abdominopelvic 04/20/2021, PET CT
03/27/2017 also reviewed

CLINICAL DATA: Chronic epigastric pain. Neutropenic. Patient
reports right-sided pain radiating into pelvis. Intermittent pain
for 5 years.

EXAM:
CT ABDOMEN AND PELVIS WITH CONTRAST
TECHNIQUE: Multidetector CT imaging of the abdomen and pelvis was performed
using the standard protocol following bolus administration of
intravenous contrast.

[Series 2: abd pel w · axial · 0.98mm/px · z∈[+613,+1043]mm · 12 of 98 slices shown, 14 images]
[im 6/98  soft-tissue]
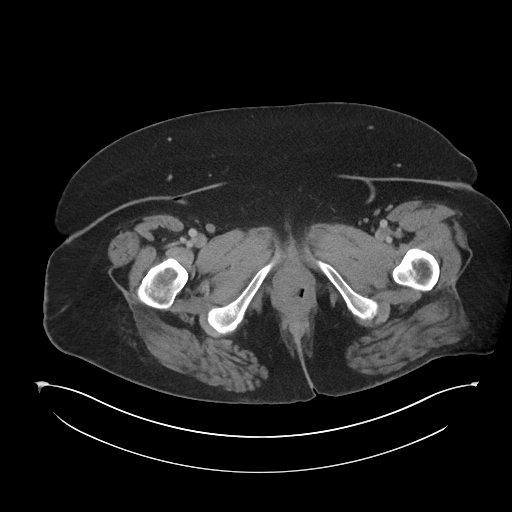
[im 6/98  bone]
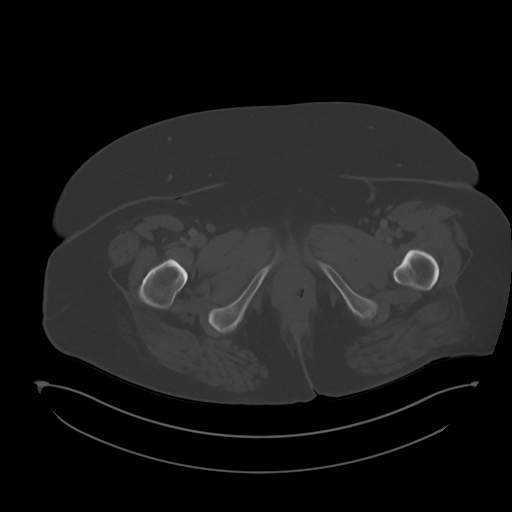
[im 16/98  soft-tissue]
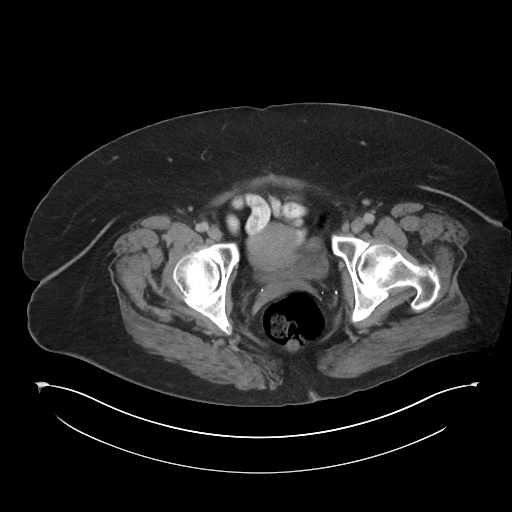
[im 21/98  soft-tissue]
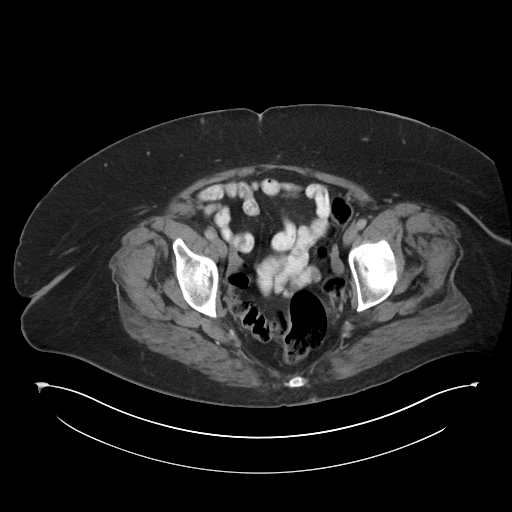
[im 31/98  soft-tissue]
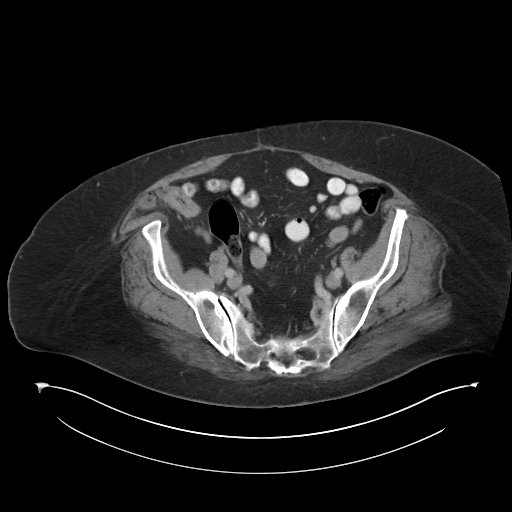
[im 36/98  soft-tissue]
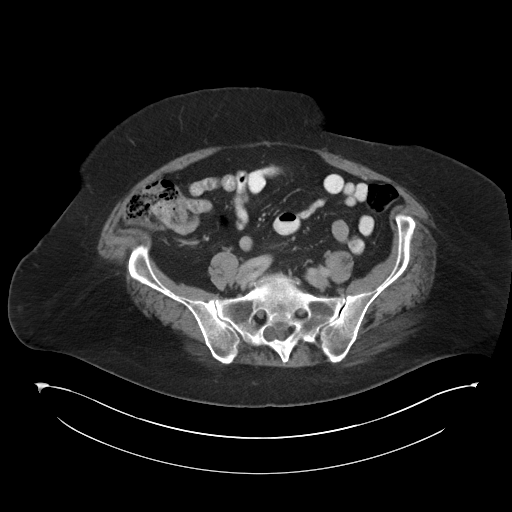
[im 46/98  soft-tissue]
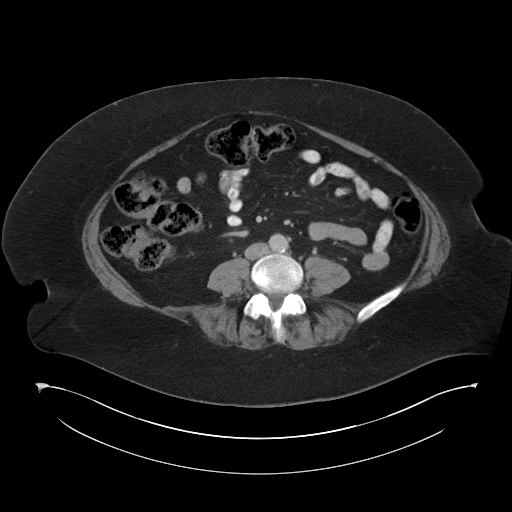
[im 52/98  soft-tissue]
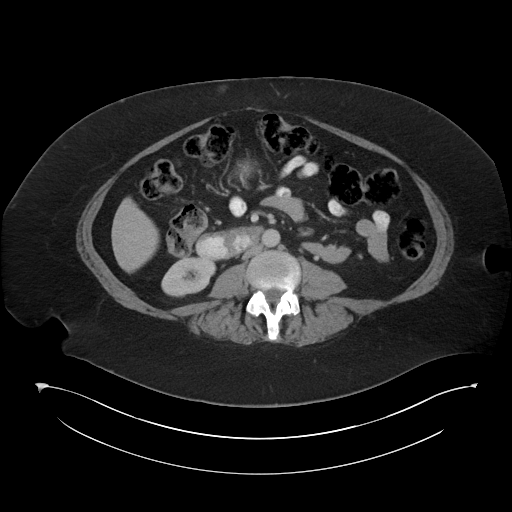
[im 62/98  soft-tissue]
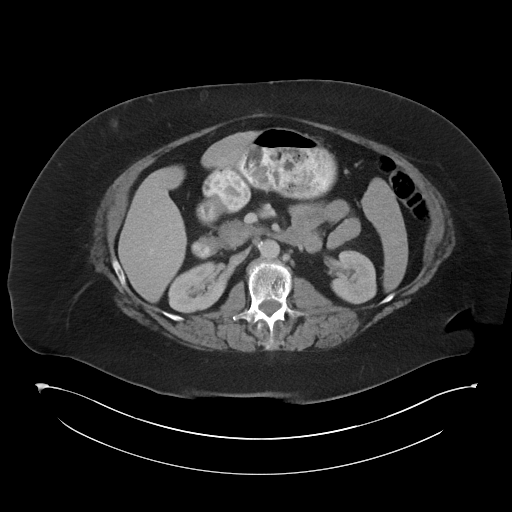
[im 67/98  soft-tissue]
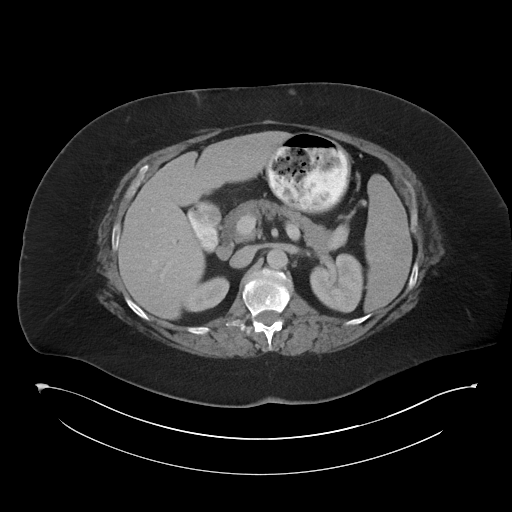
[im 67/98  bone]
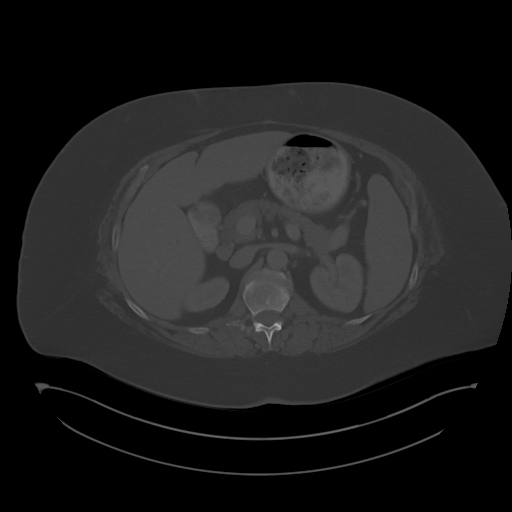
[im 77/98  soft-tissue]
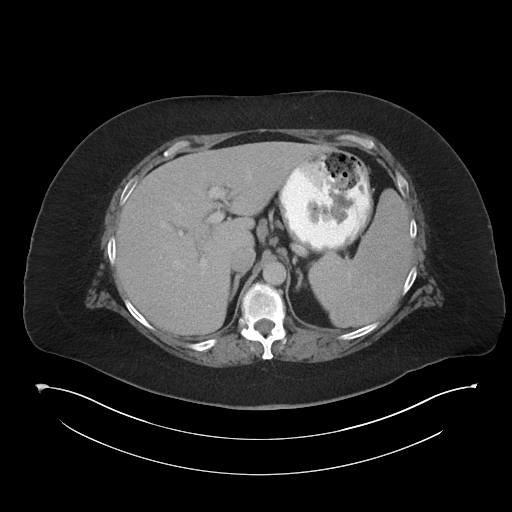
[im 82/98  soft-tissue]
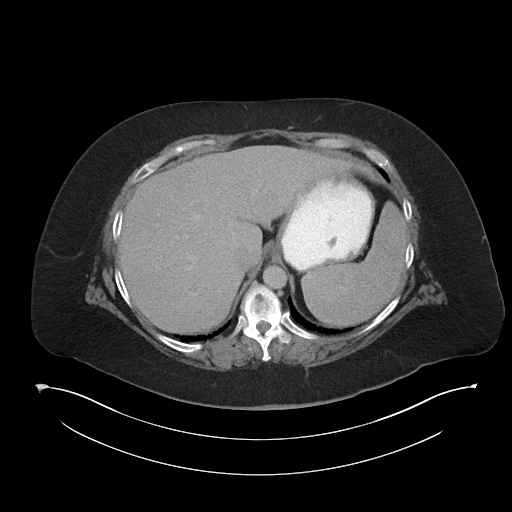
[im 92/98  soft-tissue]
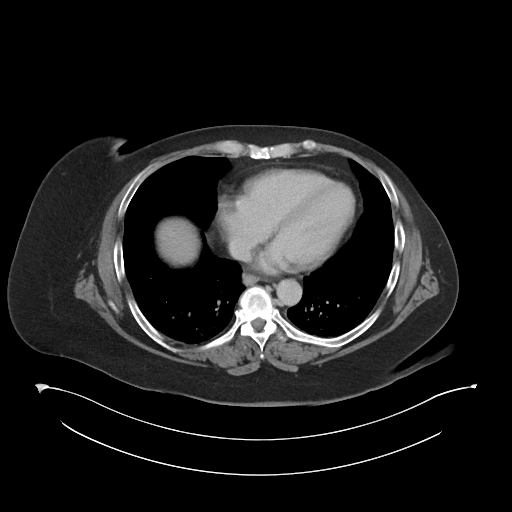

[Series 5: coronal · coronal · 0.99mm/px · 3 of 115 slices shown]
[im 39/115  soft-tissue]
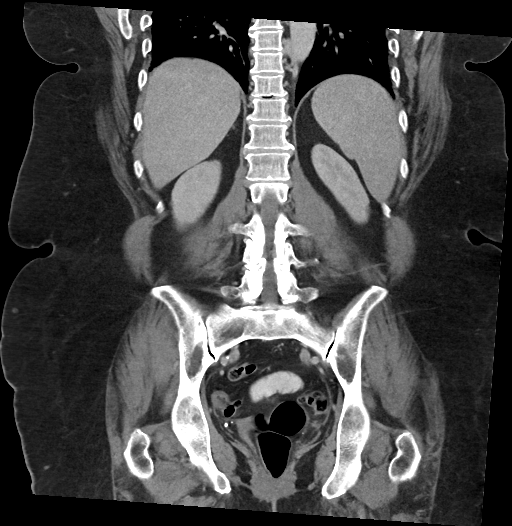
[im 51/115  soft-tissue]
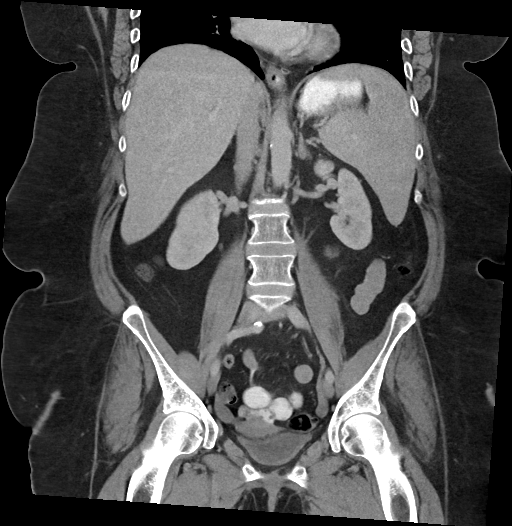
[im 64/115  soft-tissue]
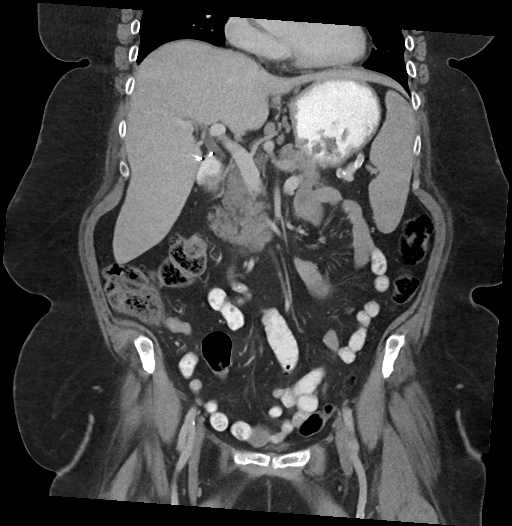

[15 of 46 positions shown; findings below may reference images not displayed]

RADIATION DOSE REDUCTION: This exam was performed according to the
departmental dose-optimization program which includes automated
exposure control, adjustment of the mA and/or kV according to
patient size and/or use of iterative reconstruction technique.

CONTRAST:  100mL OMNIPAQUE IOHEXOL 300 MG/ML  SOLN
FINDINGS: Lower chest: Dependent atelectasis in both lower lobes. No confluent
airspace disease or pleural effusion.

Hepatobiliary: No focal liver abnormality is seen. Status post
cholecystectomy. No biliary dilatation.

Pancreas: No ductal dilatation or inflammation.  No pancreatic mass.

Spleen: Again seen splenomegaly with spleen measuring 16 x 14 x 8 cm
(volume = 900 cm^3). No focal splenic abnormality.

Adrenals/Urinary Tract: No adrenal nodule. No hydronephrosis or
perinephric edema. Homogeneous renal enhancement with symmetric
excretion on delayed phase imaging. No renal calculi or focal renal
abnormality. Urinary bladder is partially distended without wall
thickening.

Stomach/Bowel: Small hiatal hernia with minimal enteric contrast in
the distal esophagus. The stomach is otherwise unremarkable. Normal
positioning of the duodenum and ligament of Treitz. Normal small
bowel without obstruction or inflammation. Administered enteric
contrast reaches the cecum. Normal appendix tentatively visualized,
regardless there is no appendicitis or evidence of appendiceal mass
moderate volume of stool throughout the colon with mild colonic
redundancy. There is no colonic wall thickening or pericolonic
edema. Occasional sigmoid colonic diverticulosis without
diverticulitis.

Vascular/Lymphatic: Mild aortic atherosclerosis. No aortic aneurysm.
Patent portal, splenic, and mesenteric veins. 15 mm periportal node,
stable from prior exams. There are scattered small retroperitoneal
lymph nodes are not enlarged by size criteria. No bulky or
suspicious abdominopelvic adenopathy.

Reproductive: Quiescent appearance of the uterus. Quiescent
appearance of the ovaries. No adnexal mass.

Other: No ascites. No abdominal wall hernia. Minimal focus of skin
thickening in the left lower quadrant abdominal wall, series 2,
image 75, nonspecific. No underlying fluid collection or
inflammation.

Musculoskeletal: Mild multilevel degenerative change in the spine.
Vacuum phenomena at L5-S1 with degenerative disc disease. There are
no acute or suspicious osseous abnormalities.
IMPRESSION: 1. No acute abnormality in the abdomen/pelvis.
2. Moderate colonic stool burden with mild colonic redundancy, can
be seen with constipation. No bowel obstruction or inflammation.
3. Minimal sigmoid colonic diverticulosis without diverticulitis.
4. Small hiatal hernia with minimal enteric contrast in the distal
esophagus, can be seen with reflux or delayed transit.
5. Unchanged splenomegaly.

Aortic Atherosclerosis ([id number]).

## 2022-09-30 ENCOUNTER — Other Ambulatory Visit: Payer: Self-pay

## 2022-09-30 ENCOUNTER — Ambulatory Visit: Payer: Medicare HMO | Attending: Sports Medicine | Admitting: Physical Therapy

## 2022-09-30 ENCOUNTER — Encounter: Payer: Self-pay | Admitting: Physical Therapy

## 2022-09-30 DIAGNOSIS — M6281 Muscle weakness (generalized): Secondary | ICD-10-CM

## 2022-09-30 DIAGNOSIS — M25512 Pain in left shoulder: Secondary | ICD-10-CM | POA: Diagnosis present

## 2022-09-30 DIAGNOSIS — R293 Abnormal posture: Secondary | ICD-10-CM | POA: Diagnosis present

## 2022-09-30 DIAGNOSIS — M542 Cervicalgia: Secondary | ICD-10-CM | POA: Diagnosis present

## 2022-09-30 DIAGNOSIS — G8929 Other chronic pain: Secondary | ICD-10-CM | POA: Diagnosis present
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
## 2022-10-06 ENCOUNTER — Ambulatory Visit: Payer: Medicare HMO | Admitting: *Deleted

## 2022-10-08 ENCOUNTER — Ambulatory Visit: Payer: Medicare HMO | Admitting: Physical Therapy

## 2022-10-14 ENCOUNTER — Ambulatory Visit: Payer: Medicare HMO | Attending: Sports Medicine | Admitting: Physical Therapy

## 2022-10-14 DIAGNOSIS — R293 Abnormal posture: Secondary | ICD-10-CM | POA: Insufficient documentation

## 2022-10-14 DIAGNOSIS — M542 Cervicalgia: Secondary | ICD-10-CM | POA: Diagnosis present
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
## 2022-10-16 ENCOUNTER — Ambulatory Visit: Payer: Medicare HMO | Admitting: Physical Therapy

## 2022-10-16 DIAGNOSIS — M542 Cervicalgia: Secondary | ICD-10-CM

## 2022-10-16 DIAGNOSIS — R293 Abnormal posture: Secondary | ICD-10-CM
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
## 2022-10-20 ENCOUNTER — Ambulatory Visit: Payer: Medicare HMO | Admitting: Physical Therapy

## 2022-10-22 ENCOUNTER — Encounter: Payer: Medicare HMO | Admitting: Physical Therapy

## 2022-10-29 ENCOUNTER — Telehealth: Payer: Self-pay

## 2022-10-29 NOTE — Telephone Encounter (Signed)
Left a message requesting pt return a call to the office. 

## 2022-11-07 LAB — COMPREHENSIVE METABOLIC PANEL
ALT: 33 IU/L — ABNORMAL HIGH (ref 0–32)
AST: 32 IU/L (ref 0–40)
Albumin/Globulin Ratio: 1.7 (ref 1.2–2.2)
Albumin: 4.1 g/dL (ref 3.9–4.9)
Alkaline Phosphatase: 147 IU/L — ABNORMAL HIGH (ref 44–121)
BUN/Creatinine Ratio: 14 (ref 12–28)
BUN: 13 mg/dL (ref 8–27)
Bilirubin Total: 0.4 mg/dL (ref 0.0–1.2)
CO2: 20 mmol/L (ref 20–29)
Calcium: 9.5 mg/dL (ref 8.7–10.3)
Chloride: 110 mmol/L — ABNORMAL HIGH (ref 96–106)
Creatinine, Ser: 0.96 mg/dL (ref 0.57–1.00)
Globulin, Total: 2.4 g/dL (ref 1.5–4.5)
Glucose: 198 mg/dL — ABNORMAL HIGH (ref 70–99)
Potassium: 4.5 mmol/L (ref 3.5–5.2)
Sodium: 143 mmol/L (ref 134–144)
Total Protein: 6.5 g/dL (ref 6.0–8.5)
eGFR: 66 mL/min/{1.73_m2} (ref >59)

## 2022-11-07 LAB — LIPID PANEL
Chol/HDL Ratio: 4.9 ratio — ABNORMAL HIGH (ref 0.0–4.4)
Cholesterol, Total: 186 mg/dL (ref 100–199)
HDL: 38 mg/dL — ABNORMAL LOW (ref >39)
LDL Chol Calc (NIH): 100 mg/dL — ABNORMAL HIGH (ref 0–99)
Triglycerides: 284 mg/dL — ABNORMAL HIGH (ref 0–149)
VLDL Cholesterol Cal: 48 mg/dL — ABNORMAL HIGH (ref 5–40)

## 2022-11-07 LAB — T4, FREE: Free T4: 0.96 ng/dL (ref 0.82–1.77)

## 2022-11-07 LAB — TSH: TSH: 1.99 u[IU]/mL (ref 0.450–4.500)

## 2022-11-10 ENCOUNTER — Ambulatory Visit: Payer: Medicare HMO | Attending: Sports Medicine | Admitting: Physical Therapy

## 2022-11-10 DIAGNOSIS — M6281 Muscle weakness (generalized): Secondary | ICD-10-CM | POA: Diagnosis present

## 2022-11-10 DIAGNOSIS — R293 Abnormal posture: Secondary | ICD-10-CM | POA: Diagnosis present

## 2022-11-10 DIAGNOSIS — M542 Cervicalgia: Secondary | ICD-10-CM | POA: Insufficient documentation
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
## 2022-11-11 ENCOUNTER — Ambulatory Visit (INDEPENDENT_AMBULATORY_CARE_PROVIDER_SITE_OTHER): Payer: Medicare HMO | Admitting: "Endocrinology

## 2022-11-11 ENCOUNTER — Encounter: Payer: Self-pay | Admitting: "Endocrinology

## 2022-11-11 VITALS — BP 116/76 | HR 76 | Ht 68.0 in | Wt 274.6 lb

## 2022-11-11 DIAGNOSIS — E1165 Type 2 diabetes mellitus with hyperglycemia: Secondary | ICD-10-CM

## 2022-11-11 DIAGNOSIS — I10 Essential (primary) hypertension: Secondary | ICD-10-CM | POA: Diagnosis not present

## 2022-11-11 DIAGNOSIS — E782 Mixed hyperlipidemia: Secondary | ICD-10-CM | POA: Diagnosis not present

## 2022-11-11 MED ORDER — OZEMPIC (2 MG/DOSE) 8 MG/3ML ~~LOC~~ SOPN: 2.0000 mg | PEN_INJECTOR | SUBCUTANEOUS | 2 refills | Status: DC

## 2022-11-11 NOTE — Progress Notes (Signed)
11/11/2022, 10:45 AM                Endocrinology follow-up note  Subjective:    Patient ID: Tina SpeckingBettina L Radcliffe, female    DOB: 04/11/1961.  Tina Sampson is being seen in follow-up in the management of currently uncontrolled symptomatic type 2 diabetes, hyperlipidemia, hypertension. PMD:  Bernadene PersonWhiteheart, Kathryn A, NP.   Past Medical History:  Diagnosis Date   Abdominal pain 10/15/2017   Diabetes mellitus    Diabetes mellitus, type II    H/O knee surgery    Hyperlipidemia    IBS (irritable bowel syndrome)    Past Surgical History:  Procedure Laterality Date   ANKLE SURGERY Left    CARDIAC CATHETERIZATION     catherization     CERVICAL DISC SURGERY     CESAREAN SECTION     CHOLECYSTECTOMY     COLONOSCOPY WITH PROPOFOL N/A 11/20/2021   Procedure: COLONOSCOPY WITH PROPOFOL;  Surgeon: Dolores Frameastaneda Mayorga, Daniel, MD;  [facility];  Service: Gastroenterology;  Laterality: N/A;  100   KNEE SURGERY     KNEE SURGERY     POLYPECTOMY  11/20/2021   Procedure: POLYPECTOMY;  Surgeon: Marguerita Merlesastaneda Mayorga, Reuel Boomaniel, MD;  [facility];  Service: Gastroenterology;;   TUBAL LIGATION     Social History   Socioeconomic History   Marital status: Married    Spouse name: Not on file   Number of children: Not on file   Years of education: Not on file   Highest education level: Not on file  Occupational History   Not on file  Tobacco Use   Smoking status: Never    Passive exposure: Current   Smokeless tobacco: Never  Vaping Use   Vaping Use: Never used  Substance and Sexual Activity   Alcohol use: No   Drug use: No   Sexual activity: Yes  Other Topics Concern   Not on file  Social History Narrative   Not on file   Social Determinants of Health   Financial Resource Strain: Not on file  Food Insecurity: Not on file  Transportation Needs: Not on file  Physical Activity: Not on file   Stress: Not on file  Social Connections: Not on file   Outpatient Encounter Medications as of 11/11/2022  Medication Sig   Semaglutide, 2 MG/DOSE, (OZEMPIC, 2 MG/DOSE,) 8 MG/3ML SOPN Inject 2 mg into the skin once a week.   Accu-Chek Softclix Lancets lancets TEST BLOOD SUGAR THREE TIMES DAILY AS DIRECTED   acetaminophen-codeine (TYLENOL #3) 300-30 MG tablet Take by mouth. (Patient not taking: Reported on 05/12/2022)   albuterol (VENTOLIN HFA) 108 (90 Base) MCG/ACT inhaler Inhale 1-2 puffs into the lungs every 4 (four) hours as needed for wheezing or shortness of breath.   Alcohol Swabs (DROPSAFE ALCOHOL PREP) 70 % PADS USE BEFORE TESTING GLUCOSE AND INJECTING BYDUREON   ascorbic acid (VITAMIN C) 500 MG tablet Take 500 mg by mouth daily.   aspirin EC 81 MG tablet Take 81 mg daily by mouth.   B Complex Vitamins (VITAMIN B COMPLEX PO) Take 1 tablet by mouth daily.   Blood  Glucose Monitoring Suppl (ACCU-CHEK GUIDE) w/Device KIT Use to test BG tid. DX e11.65   cholecalciferol (VITAMIN D3) 25 MCG (1000 UNIT) tablet Take 1,000 Units by mouth daily.   diphenhydramine-acetaminophen (TYLENOL PM) 25-500 MG TABS tablet Take 1 tablet by mouth at bedtime as needed (sleep/pain).   EPINEPHrine 0.3 mg/0.3 mL IJ SOAJ injection Inject 0.3 mg into the muscle as needed for anaphylaxis. (Patient not taking: Reported on 05/12/2022)   gabapentin (NEURONTIN) 300 MG capsule Take 900 mg by mouth 2 (two) times daily.    glucose blood (ACCU-CHEK GUIDE) test strip TEST BLOOD SUGAR THREE TIMES DAILY   hydrOXYzine (ATARAX/VISTARIL) 25 MG tablet Take 25 mg by mouth every 8 (eight) hours as needed for anxiety.   hyoscyamine (LEVSIN SL) 0.125 MG SL tablet Place 1 tablet (0.125 mg total) under the tongue every 6 (six) hours as needed for cramping.   Multiple Vitamin (MULTIVITAMIN) tablet Take 1 tablet daily by mouth.   omeprazole (PRILOSEC) 20 MG capsule Take 20 mg by mouth in the morning and at bedtime.   pramipexole (MIRAPEX)  0.5 MG tablet Take 0.5 mg by mouth 3 (three) times daily. One tid For RLS   propranolol (INDERAL) 10 MG tablet Take 10 mg by mouth 2 (two) times daily.   topiramate (TOPAMAX) 25 MG tablet Take 25 mg by mouth 2 (two) times daily.   traZODone (DESYREL) 50 MG tablet Take 50 mg by mouth at bedtime.   venlafaxine XR (EFFEXOR-XR) 150 MG 24 hr capsule Take 150 mg by mouth daily.   [DISCONTINUED] glipiZIDE (GLUCOTROL XL) 5 MG 24 hr tablet Take 1 tablet (5 mg total) by mouth daily with breakfast. (Patient not taking: Reported on 09/30/2022)   [DISCONTINUED] lisinopril (ZESTRIL) 5 MG tablet TAKE ONE TABLET ONCE DAILY (Patient not taking: Reported on 09/30/2022)   [DISCONTINUED] pravastatin (PRAVACHOL) 40 MG tablet Take 40 mg by mouth at bedtime.   [DISCONTINUED] Semaglutide, 1 MG/DOSE, 4 MG/3ML SOPN Inject 1 mg as directed once a week.   No facility-administered encounter medications on file as of 11/11/2022.    ALLERGIES: Allergies  Allergen Reactions   Bee Venom Shortness Of Breath, Itching and Swelling    VACCINATION STATUS: Immunization History  Administered Date(s) Administered   Influenza Split 05/07/2013   Moderna Sars-Covid-2 Vaccination 10/05/2019, 11/02/2019, 08/28/2020    Diabetes She presents for her follow-up diabetic visit. She has type 2 diabetes mellitus. Onset time: She was diagnosed at approximate age of 62 years. Her disease course has been worsening. There are no hypoglycemic associated symptoms. Pertinent negatives for hypoglycemia include no confusion, headaches, pallor or seizures. Pertinent negatives for diabetes include no blurred vision, no chest pain, no polydipsia, no polyphagia, no polyuria and no weight loss. There are no hypoglycemic complications. Symptoms are worsening. There are no diabetic complications. Risk factors for coronary artery disease include dyslipidemia, diabetes mellitus, obesity, sedentary lifestyle and post-menopausal. Her weight is increasing steadily.  She is following a generally unhealthy diet. When asked about meal planning, she reported none. She has not had a previous visit with a dietitian. She never participates in exercise. Her home blood glucose trend is increasing steadily. Her overall blood glucose range is 180-200 mg/dl. (She presents with her meter showing average blood glucose of 203 for the last 30 days.  She is monitoring mainly at fasting.  Her point-of-care A1c is 7.5%, increasing from 6.3% during her last visit.  She denies and did not document any hypoglycemia.     ) An ACE  inhibitor/angiotensin II receptor blocker is being taken. She does not see a podiatrist.Eye exam is current (She has not seen her ophthalmologist in 2 years, I urged to reschedule a visit.).  Hyperlipidemia This is a chronic problem. The current episode started more than 1 year ago. The problem is controlled. Exacerbating diseases include diabetes and obesity. Pertinent negatives include no chest pain, myalgias or shortness of breath. Current antihyperlipidemic treatment includes statins. Risk factors for coronary artery disease include diabetes mellitus, dyslipidemia, obesity, a sedentary lifestyle and post-menopausal.     Review of systems  Constitutional: + Progressive weight loss,  current  Body mass index is 41.75 kg/m. , no fatigue, no subjective hyperthermia, no subjective hypothermia   Objective:    BP 116/76   Pulse 76   Ht 5\' 8"  (1.727 m)   Wt 274 lb 9.6 oz (124.6 kg)   LMP 03/19/2011   BMI 41.75 kg/m   Wt Readings from Last 3 Encounters:  11/11/22 274 lb 9.6 oz (124.6 kg)  08/12/22 264 lb 6.4 oz (119.9 kg)  05/12/22 252 lb 6.4 oz (114.5 kg)    Physical Exam- Limited  Constitutional:  Body mass index is 41.75 kg/m. , not in acute distress, normal state of mind   Recent Results (from the past 2160 hour(s))  Comprehensive metabolic panel     Status: Abnormal   Collection Time: 11/06/22 10:08 AM  Result Value Ref Range   Glucose  198 (H) 70 - 99 mg/dL   BUN 13 8 - 27 mg/dL   Creatinine, Ser 1.61 0.57 - 1.00 mg/dL   eGFR 66 >09 UE/AVW/0.98   BUN/Creatinine Ratio 14 12 - 28   Sodium 143 134 - 144 mmol/L   Potassium 4.5 3.5 - 5.2 mmol/L   Chloride 110 (H) 96 - 106 mmol/L   CO2 20 20 - 29 mmol/L   Calcium 9.5 8.7 - 10.3 mg/dL   Total Protein 6.5 6.0 - 8.5 g/dL   Albumin 4.1 3.9 - 4.9 g/dL   Globulin, Total 2.4 1.5 - 4.5 g/dL   Albumin/Globulin Ratio 1.7 1.2 - 2.2   Bilirubin Total 0.4 0.0 - 1.2 mg/dL   Alkaline Phosphatase 147 (H) 44 - 121 IU/L   AST 32 0 - 40 IU/L   ALT 33 (H) 0 - 32 IU/L  Lipid panel     Status: Abnormal   Collection Time: 11/06/22 10:08 AM  Result Value Ref Range   Cholesterol, Total 186 100 - 199 mg/dL   Triglycerides 119 (H) 0 - 149 mg/dL   HDL 38 (L) >14 mg/dL   VLDL Cholesterol Cal 48 (H) 5 - 40 mg/dL   LDL Chol Calc (NIH) 782 (H) 0 - 99 mg/dL   Chol/HDL Ratio 4.9 (H) 0.0 - 4.4 ratio    Comment:                                   T. Chol/HDL Ratio                                             Men  Women                               1/2 Avg.Risk  3.4  3.3                                   Avg.Risk  5.0    4.4                                2X Avg.Risk  9.6    7.1                                3X Avg.Risk 23.4   11.0   TSH     Status: None   Collection Time: 11/06/22 10:08 AM  Result Value Ref Range   TSH 1.990 0.450 - 4.500 uIU/mL  T4, free     Status: None   Collection Time: 11/06/22 10:08 AM  Result Value Ref Range   Free T4 0.96 0.82 - 1.77 ng/dL     Lipid Panel     Component Value Date/Time   CHOL 186 11/06/2022 1008   TRIG 284 (H) 11/06/2022 1008   HDL 38 (L) 11/06/2022 1008   CHOLHDL 4.9 (H) 11/06/2022 1008   CHOLHDL 3.7 01/13/2020 1112   LDLCALC 100 (H) 11/06/2022 1008   LDLCALC 71 01/13/2020 1112    Assessment & Plan:   1. Uncontrolled type 2 diabetes mellitus with hyperglycemia (HCC)  - Lataunya L Bilderback has currently uncontrolled symptomatic type 2 DM  since  61 years of age.  She presents with her meter showing average blood glucose of 203 for the last 30 days.  She is monitoring mainly at fasting.  Her point-of-care A1c is 7.5%, increasing from 6.3% during her last visit.  She denies and did not document any hypoglycemia.      Recent labs reviewed.  -her diabetes is complicated by obesity/sedentary life and NASRIN INSLEY remains at a high risk for more acute and chronic complications which include CAD, CVA, CKD, retinopathy, and neuropathy. These are all discussed in detail with the patient.  - I have counseled her on diet management and weight loss, by adopting a carbohydrate restricted/protein rich diet.  She has disengaged from lifestyle medicine, gaining weight. - she acknowledges that there is a room for improvement in her food and drink choices. - Suggestion is made for her to avoid simple carbohydrates  from her diet including Cakes, Sweet Desserts, Ice Cream, Soda (diet and regular), Sweet Tea, Candies, Chips, Cookies, Store Bought Juices, Alcohol , Artificial Sweeteners,  Coffee Creamer, and "Sugar-free" Products, Lemonade. This will help patient to have more stable blood glucose profile and potentially avoid unintended weight gain.  The following Lifestyle Medicine recommendations according to American College of Lifestyle Medicine  [facility]) were discussed and and offered to patient and she  agrees to start the journey:  A. Whole Foods, Plant-Based Nutrition comprising of fruits and vegetables, plant-based proteins, whole-grain carbohydrates was discussed in detail with the patient.   A list for source of those nutrients were also provided to the patient.  Patient will use only water or unsweetened tea for hydration. B.  The need to stay away from risky substances including alcohol, smoking; obtaining 7 to 9 hours of restorative sleep, at least 150 minutes of moderate intensity exercise weekly, the importance of healthy social  connections,  and stress management techniques were discussed. C.  A full color page of  Calorie density of various food groups per pound showing examples of each food groups was provided to the patient.    - I encouraged her to switch to  unprocessed or minimally processed complex starch and increased protein intake (animal or plant source), fruits, and vegetables.   - I have approached her with the following individualized plan to manage diabetes and patient agrees:   -In light of her presentation with uncontrolled glycemic profile and increasing A1c, she is advised to increase her Ozempic to 2 mg subcutaneously weekly.  Side effects and precautions discussed with her.     She will also be reconsidered for low-dose metformin during her next visit if she cannot achieve control of diabetes to A1c under 7%.     2) BP/HTN -Her blood pressure is controlled to target.  She reports systolic blood pressures in the 80s and 90s at home.  She is advised to hold her lisinopril until next visit.      3) Lipids/HPL:   Her most recent lipid panel showed controlled LDL at 65.  She is advised to continue pravastatin 40 mg p.o. nightly.  Side effects and precautions discussed with her.     4) weight management: Her current BMI is 41.75 consistent with a category of morbid obesity.  This clearly interferes in the attempt to control her diabetes.   She is a candidate for modest weight loss.   5) Chronic Care/Health Maintenance:  -she  is on  Statin medications and   lisinopril is encouraged to continue to follow up with Ophthalmology, Dentist,  Podiatrist at least yearly or according to recommendations, and advised to  stay away from smoking. I have recommended yearly flu vaccine and pneumonia vaccination at least every 5 years; moderate intensity exercise for up to 150 minutes weekly; and  sleep for at least 7 hours a day.  Her screen ABI was abnormal in March 2022.  She was evaluated by vascular  surgery with better testing showing no evidence of occlusive peripheral arterial disease.   - I advised patient to maintain close follow up with Bernadene Person, NP for primary care needs.    I spent  26  minutes in the care of the patient today including review of labs from CMP, Lipids, Thyroid Function, Hematology (current and previous including abstractions from other facilities); face-to-face time discussing  her blood glucose readings/logs, discussing hypoglycemia and hyperglycemia episodes and symptoms, medications doses, her options of short and long term treatment based on the latest standards of care / guidelines;  discussion about incorporating lifestyle medicine;  and documenting the encounter. Risk reduction counseling performed per USPSTF guidelines to reduce  obesity and cardiovascular risk factors.     Please refer to Patient Instructions for Blood Glucose Monitoring and Insulin/Medications Dosing Guide"  in media tab for additional information. Please  also refer to " Patient Self Inventory" in the Media  tab for reviewed elements of pertinent patient history.  Tina Specking participated in the discussions, expressed understanding, and voiced agreement with the above plans.  All questions were answered to her satisfaction. she is encouraged to contact clinic should she have any questions or concerns prior to her return visit.   Follow up plan: - Return in about 4 months (around 03/13/2023) for Bring Meter/CGM Device/Logs- A1c in Office.  Marquis Lunch, MD [facility] [address] [place], [place] [address] Phone: [phone]  Fax: [phone]    11/11/2022, 10:45 AM  This  note was partially dictated with voice recognition software. Similar sounding words can be transcribed inadequately or may not  be corrected upon review.

## 2022-11-11 NOTE — Patient Instructions (Signed)

## 2022-11-18 ENCOUNTER — Encounter: Payer: Medicare HMO | Admitting: Physical Therapy

## 2022-11-19 ENCOUNTER — Ambulatory Visit: Payer: Medicare HMO | Admitting: Physical Therapy

## 2022-11-19 DIAGNOSIS — M542 Cervicalgia: Secondary | ICD-10-CM

## 2022-11-19 DIAGNOSIS — R293 Abnormal posture: Secondary | ICD-10-CM
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
## 2022-11-20 ENCOUNTER — Ambulatory Visit: Payer: Medicare HMO | Admitting: Physical Therapy

## 2022-11-20 DIAGNOSIS — M542 Cervicalgia: Secondary | ICD-10-CM | POA: Diagnosis not present

## 2022-11-20 DIAGNOSIS — R293 Abnormal posture: Secondary | ICD-10-CM
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
## 2022-11-26 ENCOUNTER — Ambulatory Visit: Payer: Medicare HMO | Admitting: Physical Therapy

## 2022-11-28 ENCOUNTER — Encounter: Payer: Medicare HMO | Admitting: *Deleted

## 2022-12-02 ENCOUNTER — Ambulatory Visit: Payer: Medicare HMO | Admitting: Physical Therapy

## 2022-12-02 DIAGNOSIS — M542 Cervicalgia: Secondary | ICD-10-CM | POA: Diagnosis not present

## 2022-12-02 DIAGNOSIS — M6281 Muscle weakness (generalized): Secondary | ICD-10-CM

## 2022-12-02 DIAGNOSIS — R293 Abnormal posture: Secondary | ICD-10-CM
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
## 2022-12-04 ENCOUNTER — Ambulatory Visit: Payer: Medicare HMO | Attending: Sports Medicine | Admitting: Physical Therapy

## 2022-12-04 DIAGNOSIS — M25512 Pain in left shoulder: Secondary | ICD-10-CM | POA: Diagnosis present

## 2022-12-04 DIAGNOSIS — R293 Abnormal posture: Secondary | ICD-10-CM

## 2022-12-04 DIAGNOSIS — M6281 Muscle weakness (generalized): Secondary | ICD-10-CM

## 2022-12-04 DIAGNOSIS — M542 Cervicalgia: Secondary | ICD-10-CM | POA: Insufficient documentation

## 2022-12-04 DIAGNOSIS — G8929 Other chronic pain: Secondary | ICD-10-CM | POA: Diagnosis present
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
## 2022-12-09 ENCOUNTER — Ambulatory Visit: Payer: Medicare HMO | Admitting: *Deleted

## 2022-12-11 ENCOUNTER — Encounter: Payer: Medicare HMO | Admitting: *Deleted

## 2022-12-12 ENCOUNTER — Other Ambulatory Visit: Payer: Self-pay | Admitting: "Endocrinology

## 2023-02-09 ENCOUNTER — Ambulatory Visit (INDEPENDENT_AMBULATORY_CARE_PROVIDER_SITE_OTHER): Payer: Medicare HMO | Admitting: Gastroenterology

## 2023-02-09 ENCOUNTER — Encounter (INDEPENDENT_AMBULATORY_CARE_PROVIDER_SITE_OTHER): Payer: Self-pay | Admitting: Gastroenterology

## 2023-02-09 VITALS — BP 116/80 | HR 91 | Temp 98.2°F | Ht 68.0 in | Wt 272.0 lb

## 2023-02-09 DIAGNOSIS — G8929 Other chronic pain: Secondary | ICD-10-CM | POA: Diagnosis not present

## 2023-02-09 DIAGNOSIS — R1031 Right lower quadrant pain: Secondary | ICD-10-CM | POA: Diagnosis not present

## 2023-02-09 MED ORDER — HYOSCYAMINE SULFATE 0.125 MG SL SUBL: 0.1250 mg | SUBLINGUAL_TABLET | Freq: Four times a day (QID) | SUBLINGUAL | 1 refills | Status: DC | PRN

## 2023-02-09 NOTE — Progress Notes (Signed)
Referring Provider: Bernadene Person, * Primary Care Physician:  Bernadene Person, NP Primary GI Physician: Levon Hedger   Chief Complaint  Patient presents with   Abdominal Pain    Follow up on RLQ pain. States it is about the same as last visit. States pain is always there. Sharp, cramping pain like a menstrual cramp. Takes dicyclomine for pain and it does not help.    HPI:   Tina Sampson is a 62 y.o. female with past medical history of DM type 2, HLD  Patient presenting today for follow up of abdominal pain  Last seen July 2023, at that time having ongoing RLQ pain, chronic since around 2019, taking bentyl TID with some relief. Having pain almost daily. Lost low FODMAP guide previously given to her. Taking miralax as needed, having 1-2 BMs daily. Saw GYN-pelvic US was normal.   Recommended continue with stress management, low fodmap guide, Rx levsin 0.125mg  q6h, food/symptom journal  Present: Continued RLQ pain. Can happen sometimes with activity, sometimes will come on with just sitting. She notes sometimes eating makes the pain worse, foods such as dairy, pork seem to worsen it. She is unsure if she ever tried levsin that was prescribed at last visit. Doing dicyclomine up to TID but does not note much improvement in her symptoms. She notes sitting for long periods of time makes it worse. No radiation of pain. No urinary symptoms. Denies changes in pain with defecation. She denies changes in appetite or weight loss. Having a BM about 3 times per day now, not currently taking anything for constipation. Denies rectal bleeding or melena, can have some nausea at times if pain becomes very bad.   4/23, CT pelvis with contrast: No acute abnormality in the abdomen/pelvis. 2. Moderate colonic stool burden with mild colonic redundancy, can be seen with constipation. No bowel obstruction or inflammation. 3. Minimal sigmoid colonic diverticulosis without diverticulitis. 4. Small  hiatal hernia with minimal enteric contrast in the distal esophagus, can be seen with reflux or delayed transit. 5. Unchanged splenomegaly. Last Colonoscopy: 11/20/21 Four 3 to 5 mm polyps in the transverse colon and in the ascending colon - One 4 mm polyp in the descending colon - Diverticulosis in the sigmoid colon and in the descending colon. - The distal rectum and anal verge are normal on retroflexion view. (4 tubular adenomas, one SSL) Last Endoscopy:never   Recommendations:  Repeat colonoscopy in April 2026    Past Medical History:  Diagnosis Date   Abdominal pain 10/15/2017   Diabetes mellitus    Diabetes mellitus, type II (HCC)    H/O knee surgery    Hyperlipidemia    IBS (irritable bowel syndrome)     Past Surgical History:  Procedure Laterality Date   ANKLE SURGERY Left    CARDIAC CATHETERIZATION     catherization     CERVICAL DISC SURGERY     CESAREAN SECTION     CHOLECYSTECTOMY     COLONOSCOPY WITH PROPOFOL N/A 11/20/2021   Procedure: COLONOSCOPY WITH PROPOFOL;  Surgeon: Dolores Frame, MD;  Location: [facility];  Service: Gastroenterology;  Laterality: N/A;  100   KNEE SURGERY     KNEE SURGERY     POLYPECTOMY  11/20/2021   Procedure: POLYPECTOMY;  Surgeon: Dolores Frame, MD;  Location: [facility];  Service: Gastroenterology;;   TUBAL LIGATION      Current Outpatient Medications  Medication Sig Dispense Refill   ACCU-CHEK GUIDE test strip TEST BLOOD  SUGAR THREE TIMES DAILY 300 strip 3   Accu-Chek Softclix Lancets lancets TEST BLOOD SUGAR THREE TIMES DAILY AS DIRECTED 200 each 2   albuterol (VENTOLIN HFA) 108 (90 Base) MCG/ACT inhaler Inhale 1-2 puffs into the lungs every 4 (four) hours as needed for wheezing or shortness of breath.     Alcohol Swabs (DROPSAFE ALCOHOL PREP) 70 % PADS USE BEFORE TESTING GLUCOSE AND INJECTING BYDUREON 300 each 2   ascorbic acid (VITAMIN C) 500 MG tablet Take 500 mg by mouth daily.     aspirin EC 81  MG tablet Take 81 mg daily by mouth.     B Complex Vitamins (VITAMIN B COMPLEX PO) Take 1 tablet by mouth daily.     Blood Glucose Monitoring Suppl (ACCU-CHEK GUIDE) w/Device KIT Use to test BG tid. DX e11.65 1 kit 0   cholecalciferol (VITAMIN D3) 25 MCG (1000 UNIT) tablet Take 1,000 Units by mouth daily.     diphenhydramine-acetaminophen (TYLENOL PM) 25-500 MG TABS tablet Take 1 tablet by mouth at bedtime as needed (sleep/pain).     EPINEPHrine 0.3 mg/0.3 mL IJ SOAJ injection Inject 0.3 mg into the muscle as needed for anaphylaxis.     gabapentin (NEURONTIN) 300 MG capsule Take 900 mg by mouth 2 (two) times daily.      hydrOXYzine (ATARAX/VISTARIL) 25 MG tablet Take 25 mg by mouth every 8 (eight) hours as needed for anxiety.     Multiple Vitamin (MULTIVITAMIN) tablet Take 1 tablet daily by mouth.     omeprazole (PRILOSEC) 20 MG capsule Take 20 mg by mouth in the morning and at bedtime.     pramipexole (MIRAPEX) 0.5 MG tablet Take 0.5 mg by mouth 3 (three) times daily. One tid For RLS     propranolol (INDERAL) 10 MG tablet Take 10 mg by mouth 2 (two) times daily.     Semaglutide, 2 MG/DOSE, (OZEMPIC, 2 MG/DOSE,) 8 MG/3ML SOPN Inject 2 mg into the skin once a week. 3 mL 2   traZODone (DESYREL) 50 MG tablet Take 50 mg by mouth at bedtime.     venlafaxine XR (EFFEXOR-XR) 150 MG 24 hr capsule Take 150 mg by mouth daily.     hyoscyamine (LEVSIN SL) 0.125 MG SL tablet Place 1 tablet (0.125 mg total) under the tongue every 6 (six) hours as needed for cramping. (Patient not taking: Reported on 02/09/2023) 60 tablet 2   No current facility-administered medications for this visit.    Allergies as of 02/09/2023 - Review Complete 02/09/2023  Allergen Reaction Noted   Bee venom Shortness Of Breath, Itching, and Swelling 05/15/2011    Family History  Problem Relation Age of Onset   Cancer Mother    Heart failure Mother    Hyperlipidemia Mother    Diabetes Mother    Osteoarthritis Sister    Heart  failure Brother     Social History   Socioeconomic History   Marital status: Married    Spouse name: Not on file   Number of children: Not on file   Years of education: Not on file   Highest education level: Not on file  Occupational History   Not on file  Tobacco Use   Smoking status: Never    Passive exposure: Current   Smokeless tobacco: Never  Vaping Use   Vaping Use: Never used  Substance and Sexual Activity   Alcohol use: No   Drug use: No   Sexual activity: Yes  Other Topics Concern   Not on  file  Social History Narrative   Not on file   Social Determinants of Health   Financial Resource Strain: Not on file  Food Insecurity: Not on file  Transportation Needs: Not on file  Physical Activity: Not on file  Stress: Not on file  Social Connections: Not on file    Review of systems General: negative for malaise, night sweats, fever, chills, weight loss Neck: Negative for lumps, goiter, pain and significant neck swelling Resp: Negative for cough, wheezing, dyspnea at rest CV: Negative for chest pain, leg swelling, palpitations, orthopnea GI: denies melena, hematochezia, nausea, vomiting, diarrhea, constipation, dysphagia, odyonophagia, early satiety or unintentional weight loss. +RLQ pain  MSK: Negative for joint pain or swelling, back pain, and muscle pain. Derm: Negative for itching or rash Psych: Denies depression, anxiety, memory loss, confusion. No homicidal or suicidal ideation.  Heme: Negative for prolonged bleeding, bruising easily, and swollen nodes. Endocrine: Negative for cold or heat intolerance, polyuria, polydipsia and goiter. Neuro: negative for tremor, gait imbalance, syncope and seizures. The remainder of the review of systems is noncontributory.  Physical Exam: BP 116/80 (BP Location: Right Arm, Cuff Size: Large)   Pulse 91   Temp 98.2 F (36.8 C) (Oral)   Ht 5\' 8"  (1.727 m)   Wt 272 lb (123.4 kg)   LMP 03/19/2011   BMI 41.36 kg/m   General:   Alert and oriented. No distress noted. Pleasant and cooperative.  Head:  Normocephalic and atraumatic. Eyes:  Conjuctiva clear without scleral icterus. Mouth:  Oral mucosa pink and moist. Good dentition. No lesions. Heart: Normal rate and rhythm, s1 and s2 heart sounds present.  Lungs: Clear lung sounds in all lobes. Respirations equal and unlabored. Abdomen:  +BS, soft, non-tender and non-distended. No rebound or guarding. No HSM or masses noted. Derm: No palmar erythema or jaundice Msk:  Symmetrical without gross deformities. Normal posture. Extremities:  Without edema. Neurologic:  Alert and  oriented x4 Psych:  Alert and cooperative. Normal mood and affect.  Invalid input(s): "6 MONTHS"   ASSESSMENT: Tina Sampson is a 62 y.o. female presenting today for ongoing/chronic RLQ pain  Patient with chronic RLQ pain since around 2019 with multiple evaluations to include CT imaging, pelvic and abdominal US, Colonoscopy without findings to explain her pain. She has no rectal bleeding, melena, weight loss, changes in appetite. No constipation or diarrhea currently. Pain can come on with sitting, activity, eating certain foods. Denies any radiation of pain, no urinary symptoms. Taking dicyclomine which does not provide much relief. Does not recall trying levsin that was sent at last visit and misplaced low FODMAP food guide. Will stop dicyclomine, start levsin q6h PRN and provide low fodmap guide. I considered this could be referred pain from musculoskeletal source, however, she notes some onset of pain with certain foods. I did discuss with the patient that if levsin does not work, we may consider this as more functional abdominal pain which we may try a low dose TCA to help treat. She will call me with an update on how levsin is working for her in the next few weeks, consider addition of elavil 10mg  at bedtime if no improvement.    PLAN:  Stop dicyclomine  2. Start levsin Q6h PRN   3. Low FODMAP food guide 4. Consider elavil 10mg  at bedtime   All questions were answered, patient verbalized understanding and is in agreement with plan as outlined above.   Follow Up: 4 months   Tattiana Fakhouri L. Jeanmarie Hubert, MSN,  APRN, AGNP-C Adult-Gerontology Nurse Practitioner [facility]  I have reviewed the note and agree with the APP's assessment as described in this progress note  Katrinka Blazing, MD Gastroenterology and Hepatology [facility]

## 2023-02-09 NOTE — Patient Instructions (Addendum)
Please stop dicyclomine We will start levsin 0.125mg  every 6 hours as needed  I am also providing the low fodmap food guide as this can be helpful in eliminating specific foods that tend to worsen abdominal cramping,pain. As discussed, if the above interventions are not providing any relief of your pain, we may consider starting a low dose anti depressant to help. You can call with an update in a few weeks on how levsin is working for you  Follow up 4 months   It was a pleasure to see you today. I want to create trusting relationships with patients and provide genuine, compassionate, and quality care. I truly value your feedback! please be on the lookout for a survey regarding your visit with me today. I appreciate your input about our visit and your time in completing this!    Earlie Arciga L. Jeanmarie Hubert, MSN, APRN, AGNP-C Adult-Gerontology Nurse Practitioner [facility]

## 2023-02-16 ENCOUNTER — Other Ambulatory Visit: Payer: Self-pay | Admitting: "Endocrinology

## 2023-03-04 ENCOUNTER — Telehealth: Payer: Self-pay | Admitting: "Endocrinology

## 2023-03-04 ENCOUNTER — Other Ambulatory Visit: Payer: Self-pay

## 2023-03-04 ENCOUNTER — Other Ambulatory Visit: Payer: Self-pay | Admitting: "Endocrinology

## 2023-03-04 MED ORDER — METFORMIN HCL ER 500 MG PO TB24: 500.0000 mg | ORAL_TABLET | Freq: Every day | ORAL | 1 refills | Status: DC

## 2023-03-04 MED ORDER — METFORMIN HCL ER 500 MG PO TB24: 500.0000 mg | ORAL_TABLET | Freq: Every day | ORAL | 0 refills | Status: DC

## 2023-03-04 NOTE — Telephone Encounter (Signed)
Pt said her readings have been over 200 the last week. Monday- 210 Tuesday-202 Wednesday-220

## 2023-03-04 NOTE — Telephone Encounter (Signed)
BG readings below are in the a.m. before breakfast. She states no new dietary changes, no prednisone.  Her next appt is Aug 18th

## 2023-03-04 NOTE — Telephone Encounter (Signed)
Pt notified and agrees. 

## 2023-03-05 ENCOUNTER — Telehealth: Payer: Self-pay | Admitting: "Endocrinology

## 2023-03-05 MED ORDER — METFORMIN HCL ER 500 MG PO TB24: 500.0000 mg | ORAL_TABLET | Freq: Every day | ORAL | 0 refills | Status: DC

## 2023-03-05 NOTE — Telephone Encounter (Signed)
Can you resend her Metformin? It did not go through

## 2023-03-05 NOTE — Telephone Encounter (Signed)
Rx sent 

## 2023-03-19 ENCOUNTER — Ambulatory Visit: Payer: Medicare HMO | Admitting: "Endocrinology

## 2023-03-23 ENCOUNTER — Ambulatory Visit: Payer: Medicare HMO | Admitting: "Endocrinology

## 2023-03-23 ENCOUNTER — Encounter: Payer: Self-pay | Admitting: "Endocrinology

## 2023-03-23 VITALS — BP 108/72 | HR 80 | Ht 68.0 in | Wt 265.4 lb

## 2023-03-23 DIAGNOSIS — E1165 Type 2 diabetes mellitus with hyperglycemia: Secondary | ICD-10-CM | POA: Diagnosis not present

## 2023-03-23 DIAGNOSIS — E782 Mixed hyperlipidemia: Secondary | ICD-10-CM

## 2023-03-23 DIAGNOSIS — I10 Essential (primary) hypertension: Secondary | ICD-10-CM | POA: Diagnosis not present

## 2023-03-23 DIAGNOSIS — Z7985 Long-term (current) use of injectable non-insulin antidiabetic drugs: Secondary | ICD-10-CM

## 2023-03-23 LAB — POCT GLYCOSYLATED HEMOGLOBIN (HGB A1C): HbA1c, POC (controlled diabetic range): 7.6 % — AB (ref 0.0–7.0)

## 2023-03-23 MED ORDER — EMPAGLIFLOZIN 10 MG PO TABS: 10.0000 mg | ORAL_TABLET | Freq: Every day | ORAL | 1 refills | Status: DC

## 2023-03-23 MED ORDER — TIRZEPATIDE 5 MG/0.5ML ~~LOC~~ SOAJ: 5.0000 mg | SUBCUTANEOUS | 1 refills | Status: DC

## 2023-03-23 NOTE — Progress Notes (Signed)
[facility]                                 03/23/2023, 2:46 PM                Endocrinology follow-up note  Subjective:    Patient ID: Tina Sampson, female    DOB: [birth year].  Tina Sampson is being seen in follow-up in the management of currently uncontrolled symptomatic type 2 diabetes, hyperlipidemia, hypertension. PMD:  Bernadene Person, NP.   Past Medical History:  Diagnosis Date   Abdominal pain 10/15/2017   Diabetes mellitus    Diabetes mellitus, type II (HCC)    H/O knee surgery    Hyperlipidemia    IBS (irritable bowel syndrome)    Past Surgical History:  Procedure Laterality Date   ANKLE SURGERY Left    CARDIAC CATHETERIZATION     catherization     CERVICAL DISC SURGERY     CESAREAN SECTION     CHOLECYSTECTOMY     COLONOSCOPY WITH PROPOFOL N/A 11/20/2021   Procedure: COLONOSCOPY WITH PROPOFOL;  Surgeon: Dolores Frame, MD;  [facility];  Service: Gastroenterology;  Laterality: N/A;  100   KNEE SURGERY     KNEE SURGERY     POLYPECTOMY  11/20/2021   Procedure: POLYPECTOMY;  Surgeon: Marguerita Merles, Reuel Boom, MD;  Location: [facility];  Service: Gastroenterology;;   TUBAL LIGATION     Social History   Socioeconomic History   Marital status: Married    Spouse name: Not on file   Number of children: Not on file   Years of education: Not on file   Highest education level: Not on file  Occupational History   Not on file  Tobacco Use   Smoking status: Never    Passive exposure: Current   Smokeless tobacco: Never  Vaping Use   Vaping status: Never Used  Substance and Sexual Activity   Alcohol use: No   Drug use: No   Sexual activity: Yes  Other Topics Concern   Not on file  Social History Narrative   Not on file   Social Determinants of Health   Financial Resource Strain: Low Risk  (11/02/2022)   Received from [facility], [facility]   Overall Financial Resource  Strain (CARDIA)    Difficulty of Paying Living Expenses: Not hard at all  Food Insecurity: No Food Insecurity (11/02/2022)   Received from [facility], [facility]   Hunger Vital Sign    Worried About Running Out of Food in the Last Year: Never true    Ran Out of Food in the Last Year: Never true  Transportation Needs: No Transportation Needs (11/02/2022)   Received from [facility], [facility]   PRAPARE - Transportation    Lack of Transportation (Medical): No    Lack of Transportation (Non-Medical): No  Physical Activity: Unknown (11/02/2022)   Received from [facility], [facility]   Exercise Vital Sign    Days of Exercise per Week: 0 days    Minutes of Exercise per Session: Not on file  Stress: Stress Concern Present (11/02/2022)   Received from [facility] - Occupational Stress Questionnaire    Feeling of  Stress : To some extent  Social Connections: Moderately Integrated (11/02/2022)   Received from [facility], [facility]   Social Network    How would you rate your social network (family, work, friends)?: Adequate participation with social networks   Outpatient Encounter Medications as of 03/23/2023  Medication Sig   empagliflozin (JARDIANCE) 10 MG TABS tablet Take 1 tablet (10 mg total) by mouth daily before breakfast.   tirzepatide (MOUNJARO) 5 MG/0.5ML Pen Inject 5 mg into the skin once a week.   ACCU-CHEK GUIDE test strip TEST BLOOD SUGAR THREE TIMES DAILY   Accu-Chek Softclix Lancets lancets TEST BLOOD SUGAR THREE TIMES DAILY AS DIRECTED   albuterol (VENTOLIN HFA) 108 (90 Base) MCG/ACT inhaler Inhale 1-2 puffs into the lungs every 4 (four) hours as needed for wheezing or shortness of breath.   Alcohol Swabs (DROPSAFE ALCOHOL PREP) 70 % PADS USE BEFORE TESTING GLUCOSE AND INJECTING BYDUREON   ascorbic acid (VITAMIN C) 500 MG tablet Take 500 mg by mouth daily.   aspirin EC 81 MG tablet Take 81 mg daily  by mouth.   B Complex Vitamins (VITAMIN B COMPLEX PO) Take 1 tablet by mouth daily.   Blood Glucose Monitoring Suppl (ACCU-CHEK GUIDE) w/Device KIT Use to test BG tid. DX e11.65   cholecalciferol (VITAMIN D3) 25 MCG (1000 UNIT) tablet Take 1,000 Units by mouth daily.   diphenhydramine-acetaminophen (TYLENOL PM) 25-500 MG TABS tablet Take 1 tablet by mouth at bedtime as needed (sleep/pain).   EPINEPHrine 0.3 mg/0.3 mL IJ SOAJ injection Inject 0.3 mg into the muscle as needed for anaphylaxis.   hydrOXYzine (ATARAX/VISTARIL) 25 MG tablet Take 25 mg by mouth every 8 (eight) hours as needed for anxiety.   hyoscyamine (LEVSIN SL) 0.125 MG SL tablet Place 1 tablet (0.125 mg total) under the tongue every 6 (six) hours as needed.   metFORMIN (GLUCOPHAGE-XR) 500 MG 24 hr tablet Take 1 tablet (500 mg total) by mouth daily with breakfast.   Multiple Vitamin (MULTIVITAMIN) tablet Take 1 tablet daily by mouth.   omeprazole (PRILOSEC) 20 MG capsule Take 20 mg by mouth in the morning and at bedtime.   pramipexole (MIRAPEX) 0.5 MG tablet Take 0.5 mg by mouth 3 (three) times daily. One tid For RLS   propranolol (INDERAL) 10 MG tablet Take 10 mg by mouth 2 (two) times daily.   traZODone (DESYREL) 50 MG tablet Take 50 mg by mouth at bedtime.   venlafaxine XR (EFFEXOR-XR) 150 MG 24 hr capsule Take 150 mg by mouth daily.   [DISCONTINUED] gabapentin (NEURONTIN) 300 MG capsule Take 900 mg by mouth 2 (two) times daily.    [DISCONTINUED] OZEMPIC, 2 MG/DOSE, 8 MG/3ML SOPN INJECT TWO MG into THE SKIN ONCE A WEEK   No facility-administered encounter medications on file as of 03/23/2023.    ALLERGIES: Allergies  Allergen Reactions   Bee Venom Shortness Of Breath, Itching and Swelling    VACCINATION STATUS: Immunization History  Administered Date(s) Administered   Influenza Split 05/07/2013   Moderna Sars-Covid-2 Vaccination 10/05/2019, 11/02/2019, 08/28/2020    Diabetes She presents for her follow-up diabetic  visit. She has type 2 diabetes mellitus. Onset time: She was diagnosed at approximate age of 62 years. Her disease course has been fluctuating. There are no hypoglycemic associated symptoms. Pertinent negatives for hypoglycemia include no confusion, headaches, pallor or seizures. Pertinent negatives for diabetes include no blurred vision, no chest pain, no polydipsia, no polyphagia, no polyuria and no weight loss. There are no hypoglycemic complications.  Symptoms are worsening. There are no diabetic complications. Risk factors for coronary artery disease include dyslipidemia, diabetes mellitus, obesity, sedentary lifestyle and post-menopausal. Her weight is decreasing steadily. She is following a generally unhealthy diet. When asked about meal planning, she reported none. She has not had a previous visit with a dietitian. She never participates in exercise. Her home blood glucose trend is fluctuating minimally. Her breakfast blood glucose range is generally 140-180 mg/dl. Her bedtime blood glucose range is generally 140-180 mg/dl. Her overall blood glucose range is 140-180 mg/dl. (She presents with her meter averaging 169-195 for the last 30 days.  Her point-of-care A1c is 7.6%.  She does not report hypoglycemia.       ) An ACE inhibitor/angiotensin II receptor blocker is being taken. She does not see a podiatrist.Eye exam is current (She has not seen her ophthalmologist in 2 years, I urged to reschedule a visit.).  Hyperlipidemia This is a chronic problem. The current episode started more than 1 year ago. The problem is controlled. Exacerbating diseases include diabetes and obesity. Pertinent negatives include no chest pain, myalgias or shortness of breath. Current antihyperlipidemic treatment includes statins. Risk factors for coronary artery disease include diabetes mellitus, dyslipidemia, obesity, a sedentary lifestyle and post-menopausal.     Review of systems  Constitutional: + Progressive weight  loss,  current  Body mass index is 40.35 kg/m. , no fatigue, no subjective hyperthermia, no subjective hypothermia   Objective:    BP 108/72   Pulse 80   Ht 5\' 8"  (1.727 m)   Wt 265 lb 6.4 oz (120.4 kg)   LMP 03/19/2011   BMI 40.35 kg/m   Wt Readings from Last 3 Encounters:  03/23/23 265 lb 6.4 oz (120.4 kg)  02/09/23 272 lb (123.4 kg)  11/11/22 274 lb 9.6 oz (124.6 kg)    Physical Exam- Limited  Constitutional:  Body mass index is 40.35 kg/m. , not in acute distress, normal state of mind   Recent Results (from the past 2160 hour(s))  HgB A1c     Status: Abnormal   Collection Time: 03/23/23  2:15 PM  Result Value Ref Range   Hemoglobin A1C     HbA1c POC (<> result, manual entry)     HbA1c, POC (prediabetic range)     HbA1c, POC (controlled diabetic range) 7.6 (A) 0.0 - 7.0 %     Lipid Panel     Component Value Date/Time   CHOL 186 11/06/2022 1008   TRIG 284 (H) 11/06/2022 1008   HDL 38 (L) 11/06/2022 1008   CHOLHDL 4.9 (H) 11/06/2022 1008   CHOLHDL 3.7 01/13/2020 1112   LDLCALC 100 (H) 11/06/2022 1008   LDLCALC 71 01/13/2020 1112    Assessment & Plan:   1. Uncontrolled type 2 diabetes mellitus with hyperglycemia (HCC)  - Tina Sampson has currently uncontrolled symptomatic type 2 DM since  62 years of age.  She presents with her meter averaging 169-195 for the last 30 days.  Her point-of-care A1c is 7.6%.  She does not report hypoglycemia.       Recent labs reviewed.  -her diabetes is complicated by obesity/sedentary Sampson and Tina Sampson remains at a high risk for more acute and chronic complications which include CAD, CVA, CKD, retinopathy, and neuropathy. These are all discussed in detail with the patient.  - I have counseled her on diet management and weight loss, by adopting a carbohydrate restricted/protein rich diet.  She has disengaged from lifestyle medicine, gaining  weight. - she acknowledges that there is a room for improvement in her  food and drink choices. - Suggestion is made for her to avoid simple carbohydrates  from her diet including Cakes, Sweet Desserts, Ice Cream, Soda (diet and regular), Sweet Tea, Candies, Chips, Cookies, Store Bought Juices, Alcohol , Artificial Sweeteners,  Coffee Creamer, and "Sugar-free" Products, Lemonade. This will help patient to have more stable blood glucose profile and potentially avoid unintended weight gain.  The following Lifestyle Medicine recommendations according to American College of Lifestyle Medicine  [facility]) were discussed and and offered to patient and she  agrees to start the journey:  A. Whole Foods, Plant-Based Nutrition comprising of fruits and vegetables, plant-based proteins, whole-grain carbohydrates was discussed in detail with the patient.   A list for source of those nutrients were also provided to the patient.  Patient will use only water or unsweetened tea for hydration. B.  The need to stay away from risky substances including alcohol, smoking; obtaining 7 to 9 hours of restorative sleep, at least 150 minutes of moderate intensity exercise weekly, the importance of healthy social connections,  and stress management techniques were discussed. C.  A full color page of  Calorie density of various food groups per pound showing examples of each food groups was provided to the patient.    - I encouraged her to switch to  unprocessed or minimally processed complex starch and increased protein intake (animal or plant source), fruits, and vegetables.   - I have approached her with the following individualized plan to manage diabetes and patient agrees:   -In light of her presentation with uncontrolled glycemic profile and increasing A1c to 7.6%, she is approached for initiation of low-dose SGLT2 inhibitor which she agrees to.  I discussed and added Jardiance 10 mg p.o. daily at breakfast.  Side effects and precautions discussed with her. - After he finishes her current  supplies of Ozempic 2 mg subcutaneously weekly, she will be switched to Mounjaro 5 mg subcutaneously weekly.  This medication will be advanced if tolerated.   She will also be reconsidered for low-dose metformin during her next visit if she cannot achieve control of diabetes to A1c under 7%.     2) BP/HTN -Her blood pressure is controlled to target.  She remains off of lisinopril.  Her blood pressure today is 108/72. Marland Kitchen      3) Lipids/HPL:   Her most recent lipid panel showed controlled LDL at 65.  She is advised to continue pravastatin 40 mg p.o. nightly.     Side effects and precautions discussed with her.     4) weight management: Her current BMI is 40.35- consistent with a category of morbid obesity.  This clearly interferes in the attempt to control her diabetes.   She is a candidate for modest weight loss.   5) Chronic Care/Health Maintenance:  -she  is on  Statin medications and   lisinopril is encouraged to continue to follow up with Ophthalmology, Dentist,  Podiatrist at least yearly or according to recommendations, and advised to  stay away from smoking. I have recommended yearly flu vaccine and pneumonia vaccination at least every 5 years; moderate intensity exercise for up to 150 minutes weekly; and  sleep for at least 7 hours a day.  Her screen ABI was abnormal in March 2022.  She was evaluated by vascular surgery with better testing showing no evidence of occlusive peripheral arterial disease.   - I advised patient to maintain  close follow up with Tina Purpura Mellody Life, NP for primary care needs.   I spent  41  minutes in the care of the patient today including review of labs from CMP, Lipids, Thyroid Function, Hematology (current and previous including abstractions from other facilities); face-to-face time discussing  her blood glucose readings/logs, discussing hypoglycemia and hyperglycemia episodes and symptoms, medications doses, her options of short and long term  treatment based on the latest standards of care / guidelines;  discussion about incorporating lifestyle medicine;  and documenting the encounter. Risk reduction counseling performed per USPSTF guidelines to reduce  obesity and cardiovascular risk factors.     Please refer to Patient Instructions for Blood Glucose Monitoring and Insulin/Medications Dosing Guide"  in media tab for additional information. Please  also refer to " Patient Self Inventory" in the Media  tab for reviewed elements of pertinent patient history.  Tina Sampson participated in the discussions, expressed understanding, and voiced agreement with the above plans.  All questions were answered to her satisfaction. she is encouraged to contact clinic should she have any questions or concerns prior to her return visit.   Follow up plan: - Return in about 4 months (around 07/23/2023) for F/U with Pre-visit Labs, Meter/CGM/Logs, A1c here.  Marquis Lunch, MD [facility] [address] [place], [place] [address] Phone: [phone]  Fax: [phone]    03/23/2023, 2:46 PM  This note was partially dictated with voice recognition software. Similar sounding words can be transcribed inadequately or may not  be corrected upon review.

## 2023-03-23 NOTE — Patient Instructions (Signed)

## 2023-03-24 ENCOUNTER — Other Ambulatory Visit: Payer: Self-pay

## 2023-03-24 ENCOUNTER — Telehealth: Payer: Self-pay

## 2023-03-24 DIAGNOSIS — E1165 Type 2 diabetes mellitus with hyperglycemia: Secondary | ICD-10-CM

## 2023-03-24 MED ORDER — METFORMIN HCL ER 500 MG PO TB24
500.0000 mg | ORAL_TABLET | Freq: Every day | ORAL | 0 refills | Status: AC
Start: 2023-03-24 — End: ?

## 2023-03-24 NOTE — Telephone Encounter (Signed)
Left a message requesting pt return call to the office. 

## 2023-04-08 ENCOUNTER — Other Ambulatory Visit: Payer: Self-pay | Admitting: "Endocrinology

## 2023-04-08 DIAGNOSIS — E1165 Type 2 diabetes mellitus with hyperglycemia: Secondary | ICD-10-CM

## 2023-05-05 ENCOUNTER — Other Ambulatory Visit: Payer: Self-pay | Admitting: "Endocrinology

## 2023-05-05 DIAGNOSIS — E1165 Type 2 diabetes mellitus with hyperglycemia: Secondary | ICD-10-CM

## 2023-05-19 ENCOUNTER — Other Ambulatory Visit: Payer: Self-pay

## 2023-05-19 ENCOUNTER — Ambulatory Visit: Payer: Medicare HMO | Attending: Physician Assistant | Admitting: Physical Therapy

## 2023-05-19 ENCOUNTER — Encounter: Payer: Self-pay | Admitting: Physical Therapy

## 2023-05-19 DIAGNOSIS — M62830 Muscle spasm of back: Secondary | ICD-10-CM | POA: Diagnosis present

## 2023-05-19 DIAGNOSIS — M542 Cervicalgia: Secondary | ICD-10-CM | POA: Insufficient documentation

## 2023-05-19 DIAGNOSIS — R293 Abnormal posture: Secondary | ICD-10-CM | POA: Diagnosis present

## 2023-05-19 DIAGNOSIS — M6281 Muscle weakness (generalized): Secondary | ICD-10-CM | POA: Insufficient documentation
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
## 2023-05-22 ENCOUNTER — Encounter: Payer: Self-pay | Admitting: *Deleted

## 2023-05-22 ENCOUNTER — Ambulatory Visit: Payer: Medicare HMO | Admitting: *Deleted

## 2023-05-22 DIAGNOSIS — M542 Cervicalgia: Secondary | ICD-10-CM

## 2023-05-22 DIAGNOSIS — M6281 Muscle weakness (generalized): Secondary | ICD-10-CM

## 2023-05-22 DIAGNOSIS — R293 Abnormal posture: Secondary | ICD-10-CM
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
## 2023-05-26 ENCOUNTER — Ambulatory Visit: Payer: Medicare HMO | Admitting: *Deleted

## 2023-05-26 ENCOUNTER — Encounter: Payer: Self-pay | Admitting: *Deleted

## 2023-05-26 DIAGNOSIS — M542 Cervicalgia: Secondary | ICD-10-CM | POA: Diagnosis not present

## 2023-05-26 DIAGNOSIS — R293 Abnormal posture: Secondary | ICD-10-CM

## 2023-05-26 DIAGNOSIS — M6281 Muscle weakness (generalized): Secondary | ICD-10-CM
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
## 2023-05-28 ENCOUNTER — Ambulatory Visit: Payer: Medicare HMO | Admitting: *Deleted

## 2023-05-28 ENCOUNTER — Encounter: Payer: Self-pay | Admitting: *Deleted

## 2023-05-28 DIAGNOSIS — M6281 Muscle weakness (generalized): Secondary | ICD-10-CM

## 2023-05-28 DIAGNOSIS — M542 Cervicalgia: Secondary | ICD-10-CM | POA: Diagnosis not present

## 2023-05-28 DIAGNOSIS — R293 Abnormal posture: Secondary | ICD-10-CM
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
## 2023-06-01 NOTE — Telephone Encounter (Signed)
Called and relayed [facility] directions to pt.  Pt understood

## 2023-06-01 NOTE — Telephone Encounter (Signed)
Pt called with high BG readings.   Date Before breakfast Before lunch Before supper Bedtime  05/27/23 240     05/28/23 202           05/30/23 188     05/31/23 271     06/01/23 255(at 1:02 p.m.)             Pt taking: Metformin 500 once a day, Jardiance 10 mg once a day, mounjaro 5 mg once a week

## 2023-06-01 NOTE — Telephone Encounter (Signed)
Have her check her glucose twice daily (before breakfast and before bed).  Have her increase her Jardiance to 2 pills once daily for now (if I need to send in a refill I will be sending in for the Jardiance 25 mg tabs).  Have her reach out in a week with Dr. Fransico Him with readings and we can make adjustments.

## 2023-06-02 ENCOUNTER — Encounter: Payer: Self-pay | Admitting: *Deleted

## 2023-06-02 ENCOUNTER — Ambulatory Visit: Payer: Medicare HMO | Admitting: *Deleted

## 2023-06-02 DIAGNOSIS — M542 Cervicalgia: Secondary | ICD-10-CM

## 2023-06-02 DIAGNOSIS — R293 Abnormal posture: Secondary | ICD-10-CM
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
## 2023-06-04 ENCOUNTER — Encounter: Payer: Medicare HMO | Admitting: Physical Therapy

## 2023-06-04 ENCOUNTER — Telehealth: Payer: Self-pay | Admitting: "Endocrinology

## 2023-06-04 MED ORDER — EMPAGLIFLOZIN 25 MG PO TABS: 25.0000 mg | ORAL_TABLET | Freq: Every day | ORAL | 1 refills | Status: DC

## 2023-06-04 NOTE — Telephone Encounter (Signed)
Pt left a VM stating she needs a refill on her Jardiance. She is running out too soon. [facility]

## 2023-06-04 NOTE — Telephone Encounter (Signed)
Spoke with pharmacy prior to speaking with Mrs Tina Sampson. They state she should not be out but unsure if she is still on the 10mg  or 25mg . 25mg  was sent in but resent in case pt is still taking 2 10mg . Pt did not answer. Left msg.

## 2023-06-09 ENCOUNTER — Encounter: Payer: Self-pay | Admitting: *Deleted

## 2023-06-09 ENCOUNTER — Ambulatory Visit: Payer: Medicare HMO | Attending: Physician Assistant | Admitting: *Deleted

## 2023-06-09 DIAGNOSIS — R293 Abnormal posture: Secondary | ICD-10-CM | POA: Diagnosis present

## 2023-06-09 DIAGNOSIS — M6281 Muscle weakness (generalized): Secondary | ICD-10-CM | POA: Insufficient documentation

## 2023-06-09 DIAGNOSIS — M542 Cervicalgia: Secondary | ICD-10-CM | POA: Insufficient documentation
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
## 2023-06-11 ENCOUNTER — Encounter: Payer: Medicare HMO | Admitting: Physical Therapy

## 2023-06-15 ENCOUNTER — Ambulatory Visit (INDEPENDENT_AMBULATORY_CARE_PROVIDER_SITE_OTHER): Payer: Medicare HMO | Admitting: Gastroenterology

## 2023-06-15 ENCOUNTER — Encounter (INDEPENDENT_AMBULATORY_CARE_PROVIDER_SITE_OTHER): Payer: Self-pay

## 2023-06-16 ENCOUNTER — Encounter: Payer: Medicare HMO | Admitting: Physical Therapy

## 2023-06-17 ENCOUNTER — Ambulatory Visit: Payer: Medicare HMO | Admitting: Physical Therapy

## 2023-06-17 DIAGNOSIS — M542 Cervicalgia: Secondary | ICD-10-CM

## 2023-06-17 DIAGNOSIS — R293 Abnormal posture: Secondary | ICD-10-CM | POA: Diagnosis not present
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
## 2023-06-18 ENCOUNTER — Encounter (INDEPENDENT_AMBULATORY_CARE_PROVIDER_SITE_OTHER): Payer: Self-pay | Admitting: Gastroenterology

## 2023-06-18 ENCOUNTER — Ambulatory Visit (INDEPENDENT_AMBULATORY_CARE_PROVIDER_SITE_OTHER): Payer: Medicare HMO | Admitting: Gastroenterology

## 2023-06-18 ENCOUNTER — Ambulatory Visit: Payer: Medicare HMO | Admitting: Physical Therapy

## 2023-06-18 VITALS — BP 104/71 | HR 74 | Ht 68.0 in | Wt 260.5 lb

## 2023-06-18 DIAGNOSIS — R1031 Right lower quadrant pain: Secondary | ICD-10-CM | POA: Diagnosis not present

## 2023-06-18 DIAGNOSIS — M542 Cervicalgia: Secondary | ICD-10-CM

## 2023-06-18 DIAGNOSIS — R293 Abnormal posture: Secondary | ICD-10-CM

## 2023-06-18 DIAGNOSIS — G8929 Other chronic pain: Secondary | ICD-10-CM | POA: Diagnosis not present

## 2023-06-18 DIAGNOSIS — K581 Irritable bowel syndrome with constipation: Secondary | ICD-10-CM | POA: Diagnosis not present

## 2023-06-18 MED ORDER — HYOSCYAMINE SULFATE 0.125 MG SL SUBL: 0.1250 mg | SUBLINGUAL_TABLET | Freq: Four times a day (QID) | SUBLINGUAL | 1 refills | Status: DC | PRN
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
## 2023-06-18 NOTE — Progress Notes (Addendum)
Referring Provider: Bernadene Person, * Primary Care Physician:  Bernadene Person, NP Primary GI Physician: Dr. Levon Hedger   Chief Complaint  Patient presents with   Abdominal Pain    Follow up on abdominal pain. States she is doing ok. Taking omeprazole 40mg  bid. States she has belching some.    Irritable Bowel Syndrome    Wanted to discuss IBS c.     HPI:   Tina Sampson is a 62 y.o. female with past medical history of DM type 2, HLD, GERD, IBS  Patient presenting today for follow up of IBS, chronic abdominal pain   Last seen July 2024, at that time continued right lower quadrant pain since 2019 that symptoms happens with activity sometimes comes on with sitting.  Notes some time certain foods seem to make pain worse.  To be dicyclomine up to 3 times daily without much improvement.  Having 3 BMs per day.  Occasional nausea pain becomes very bad.  Has also had GYN workup for pain with pelvic ultrasound that was normal  Patient has stopped dicyclomine start Levsin every 6 hours as needed, low FODMAP food guide, consider Elavil 10 mg at bedtime if pain persist.  Present: States some continued RLQ pain though occurring less frequently than before, maybe every few months. No constipation or diarrhea, having a BM 1-2 times per day. Does not think she tried levsin that was prescribed at last visit. Not currently taking anything fro her abdominal pain. Appetite is good. She is down from 274 lbs to 260 lbs since April, notes she is trying to lose some weight and has been with her grand kids so has been more active doing things with them. Does not think anything specific as far as foods makes her pain come on.   On omeprazole 40mg  BID though this was not prescribed by [place]. She is doing well on this. No dysphagia or odynophagia   4/23, CT pelvis with contrast: No acute abnormality in the abdomen/pelvis. 2. Moderate colonic stool burden with mild colonic redundancy, can be seen with  constipation. No bowel obstruction or inflammation. 3. Minimal sigmoid colonic diverticulosis without diverticulitis. 4. Small hiatal hernia with minimal enteric contrast in the distal esophagus, can be seen with reflux or delayed transit. 5. Unchanged splenomegaly. Last Colonoscopy: 11/20/21 Four 3 to 5 mm polyps in the transverse colon and in the ascending colon - One 4 mm polyp in the descending colon - Diverticulosis in the sigmoid colon and in the descending colon. - The distal rectum and anal verge are normal on retroflexion view. (4 tubular adenomas, one SSL) Last Endoscopy:never   Recommendations:  Repeat colonoscopy in April 2026    Past Medical History:  Diagnosis Date   Abdominal pain 10/15/2017   Diabetes mellitus    Diabetes mellitus, type II (HCC)    H/O knee surgery    Hyperlipidemia    IBS (irritable bowel syndrome)     Past Surgical History:  Procedure Laterality Date   ANKLE SURGERY Left    CARDIAC CATHETERIZATION     catherization     CERVICAL DISC SURGERY     CESAREAN SECTION     CHOLECYSTECTOMY     COLONOSCOPY WITH PROPOFOL N/A 11/20/2021   Procedure: COLONOSCOPY WITH PROPOFOL;  Surgeon: Dolores Frame, MD;  [facility];  Service: Gastroenterology;  Laterality: N/A;  100   KNEE SURGERY     KNEE SURGERY     POLYPECTOMY  11/20/2021   Procedure:  POLYPECTOMY;  Surgeon: Marguerita Merles, Reuel Boom, MD;  [facility];  Service: Gastroenterology;;   TUBAL LIGATION      Current Outpatient Medications  Medication Sig Dispense Refill   ACCU-CHEK GUIDE test strip TEST BLOOD SUGAR THREE TIMES DAILY 300 strip 3   Accu-Chek Softclix Lancets lancets TEST BLOOD SUGAR THREE TIMES DAILY AS DIRECTED 200 each 2   albuterol (VENTOLIN HFA) 108 (90 Base) MCG/ACT inhaler Inhale 1-2 puffs into the lungs every 4 (four) hours as needed for wheezing or shortness of breath.     Alcohol Swabs (DROPSAFE ALCOHOL PREP) 70 % PADS USE BEFORE TESTING  GLUCOSE AND INJECTING BYDUREON 300 each 2   ascorbic acid (VITAMIN C) 500 MG tablet Take 500 mg by mouth daily.     aspirin EC 81 MG tablet Take 81 mg daily by mouth.     B Complex Vitamins (VITAMIN B COMPLEX PO) Take 1 tablet by mouth daily.     Blood Glucose Monitoring Suppl (ACCU-CHEK GUIDE) w/Device KIT Use to test BG tid. DX e11.65 1 kit 0   cholecalciferol (VITAMIN D3) 25 MCG (1000 UNIT) tablet Take 1,000 Units by mouth daily.     diphenhydramine-acetaminophen (TYLENOL PM) 25-500 MG TABS tablet Take 1 tablet by mouth at bedtime as needed (sleep/pain).     empagliflozin (JARDIANCE) 25 MG TABS tablet Take 1 tablet (25 mg total) by mouth daily. 30 tablet 1   EPINEPHrine 0.3 mg/0.3 mL IJ SOAJ injection Inject 0.3 mg into the muscle as needed for anaphylaxis.     hydrOXYzine (ATARAX/VISTARIL) 25 MG tablet Take 25 mg by mouth every 8 (eight) hours as needed for anxiety.     hyoscyamine (LEVSIN SL) 0.125 MG SL tablet Place 1 tablet (0.125 mg total) under the tongue every 6 (six) hours as needed. 90 tablet 1   metFORMIN (GLUCOPHAGE-XR) 500 MG 24 hr tablet TAKE 1 TABLET EVERY DAY WITH BREAKFAST 90 tablet 1   Multiple Vitamin (MULTIVITAMIN) tablet Take 1 tablet daily by mouth.     omeprazole (PRILOSEC) 20 MG capsule Take 20 mg by mouth in the morning and at bedtime.     pramipexole (MIRAPEX) 0.5 MG tablet Take 0.5 mg by mouth 3 (three) times daily. One tid For RLS     propranolol (INDERAL) 10 MG tablet Take 10 mg by mouth 2 (two) times daily.     tirzepatide [facility]) 5 MG/0.5ML Pen Inject 5 mg into the skin once a week. 6 mL 1   traZODone (DESYREL) 50 MG tablet Take 50 mg by mouth at bedtime.     venlafaxine XR (EFFEXOR-XR) 150 MG 24 hr capsule Take 150 mg by mouth daily.     No current facility-administered medications for this visit.    Allergies as of 06/18/2023 - Review Complete 06/18/2023  Allergen Reaction Noted   Bee venom Shortness Of Breath, Itching, and Swelling 05/15/2011     Family History  Problem Relation Age of Onset   Cancer Mother    Heart failure Mother    Hyperlipidemia Mother    Diabetes Mother    Osteoarthritis Sister    Heart failure Brother     Social History   Socioeconomic History   Marital status: Married    Spouse name: Not on file   Number of children: Not on file   Years of education: Not on file   Highest education level: Not on file  Occupational History   Not on file  Tobacco Use   Smoking status: Never  Passive exposure: Current   Smokeless tobacco: Never  Vaping Use   Vaping status: Never Used  Substance and Sexual Activity   Alcohol use: No   Drug use: No   Sexual activity: Yes  Other Topics Concern   Not on file  Social History Narrative   Not on file   Social Determinants of Health   Financial Resource Strain: Low Risk  (11/02/2022)   Received from [facility], [facility]   Overall Financial Resource Strain (CARDIA)    Difficulty of Paying Living Expenses: Not hard at all  Food Insecurity: No Food Insecurity (11/02/2022)   Received from [facility], [facility]   Hunger Vital Sign    Worried About Running Out of Food in the Last Year: Never true    Ran Out of Food in the Last Year: Never true  Transportation Needs: No Transportation Needs (11/02/2022)   Received from [facility], [facility]   PRAPARE - Transportation    Lack of Transportation (Medical): No    Lack of Transportation (Non-Medical): No  Physical Activity: Unknown (11/02/2022)   Received from [facility], [facility]   Exercise Vital Sign    Days of Exercise per Week: 0 days    Minutes of Exercise per Session: Not on file  Stress: Stress Concern Present (11/02/2022)   Received from [facility], [facility] of Occupational Health - Occupational Stress Questionnaire    Feeling of Stress : To some extent  Social Connections: Moderately Integrated (11/02/2022)   Received from [facility], [facility]   Social Network    How would you rate your social network (family, work, friends)?: Adequate participation with social networks    Review of systems General: negative for malaise, night sweats, fever, chills, weight loss Neck: Negative for lumps, goiter, pain and significant neck swelling Resp: Negative for cough, wheezing, dyspnea at rest CV: Negative for chest pain, leg swelling, palpitations, orthopnea GI: denies melena, hematochezia, nausea, vomiting, diarrhea, constipation, dysphagia, odyonophagia, early satiety or unintentional weight loss. +chronic RLQ pain The remainder of the review of systems is noncontributory.  Physical Exam: BP 104/71 (BP Location: Right Arm, Patient Position: Sitting, Cuff Size: Small)   Pulse 74   Ht 5\' 8"  (1.727 m)   Wt 260 lb 8 oz (118.2 kg)   LMP 03/19/2011   BMI 39.61 kg/m  General:   Alert and oriented. No distress noted. Pleasant and cooperative.  Head:  Normocephalic and atraumatic. Eyes:  Conjuctiva clear without scleral icterus. Mouth:  Oral mucosa pink and moist. Good dentition. No lesions. Heart: Normal rate and rhythm, s1 and s2 heart sounds present.  Lungs: Clear lung sounds in all lobes. Respirations equal and unlabored. Abdomen:  +BS, soft, non-tender and non-distended. No rebound or guarding. No HSM or masses noted. Derm: No palmar erythema or jaundice Msk:  Symmetrical without gross deformities. Normal posture. Extremities:  Without edema. Neurologic:  Alert and  oriented x4 Psych:  Alert and cooperative. Normal mood and affect.  Invalid input(s): "6 MONTHS"   ASSESSMENT: Tina Sampson is a 62 y.o. female presenting today for follow up of IBS-C and chronic RLQ pain  Patient has a current issues with constipation, having a bowel movement 1-2 times per day without intervention.  She is trying to drink good amount of water.  Denies rectal bleeding or melena.  Continued right lower quadrant pain which has been  intermittent, chronic since 2019 with extensive workup as outlined above.  Suspect  pain is secondary to her IBS/functional, could be some underlying nerve damage/scar tissue from multiple previous abdominal surgeries.  She has tried dicyclomine in the past without much improvement, Levsin was prescribed at last visit but she does not think she ever tried this, we have discussed low-dose TCA in the past if antispasmodics do not provide improvement.  She does feel that right lower quadrant pain has lessened in frequency, we will trial Levsin every 6 hours for her pain, if this does not improve symptoms we will try low-dose nightly Elavil.  She should continue with good water intake, diet high in fruits, veggies, whole grains.   PLAN:  Trial levsin q6h PRN 2. Consider elavil if RLQ not improved with anti spasmodics 3. Increase water intake, aim for atleast 64 oz per day Increase fruits, veggies and whole grains, kiwi and prunes are especially good for constipation4 m  All questions were answered, patient verbalized understanding and is in agreement with plan as outlined above.    Follow Up: 4 months  Jaydon Soroka L. Jeanmarie Hubert, MSN, APRN, AGNP-C Adult-Gerontology Nurse Practitioner [facility]  I have reviewed the note and agree with the APP's assessment as described in this progress note  Katrinka Blazing, MD Gastroenterology and Hepatology [facility] Gastroenterology

## 2023-06-18 NOTE — Patient Instructions (Signed)
We will try levsin every 6 hours as needed for your abdominal pain If you do not have good results with this and pain continues we may trial low dose amitriptyline  Follow up 4 months  It was a pleasure to see you today. I want to create trusting relationships with patients and provide genuine, compassionate, and quality care. I truly value your feedback! please be on the lookout for a survey regarding your visit with me today. I appreciate your input about our visit and your time in completing this!    Paislei Dorval L. Jeanmarie Hubert, MSN, APRN, AGNP-C Adult-Gerontology Nurse Practitioner [facility] Gastroenterology at [facility]

## 2023-06-22 ENCOUNTER — Telehealth: Payer: Self-pay | Admitting: "Endocrinology

## 2023-06-22 MED ORDER — TIRZEPATIDE 7.5 MG/0.5ML ~~LOC~~ SOAJ: 7.5000 mg | SUBCUTANEOUS | 0 refills | Status: DC

## 2023-06-22 NOTE — Telephone Encounter (Signed)
Lets try increasing her Mounjaro to 7.5 mg SQ weekly, I will send in the prescription for her.  That will take some time to bring those numbers down.  In the interim, reinforce healthy diet (3 meals per day) no sweet drinks.

## 2023-06-22 NOTE — Telephone Encounter (Signed)
Patient was called and made aware. 

## 2023-06-22 NOTE — Telephone Encounter (Signed)
Pt called with high BG readings.   Date Before breakfast Before lunch Before supper Bedtime  11/15 221   Doesn't have it  11/16 212   Doesn't have it  11/17 225   313  11/18 340 Right now 297      Pt taking: Mounjaro 5mg  1x a week Metformin 500 mg Q.D Jardiance 25mg  Q.D

## 2023-06-23 ENCOUNTER — Ambulatory Visit: Payer: Medicare HMO | Admitting: *Deleted

## 2023-06-23 DIAGNOSIS — R293 Abnormal posture: Secondary | ICD-10-CM | POA: Diagnosis not present

## 2023-06-23 DIAGNOSIS — M6281 Muscle weakness (generalized): Secondary | ICD-10-CM

## 2023-06-23 DIAGNOSIS — M542 Cervicalgia: Secondary | ICD-10-CM
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
## 2023-06-25 ENCOUNTER — Encounter: Payer: Medicare HMO | Admitting: Physical Therapy

## 2023-06-30 ENCOUNTER — Encounter: Payer: Self-pay | Admitting: *Deleted

## 2023-06-30 ENCOUNTER — Ambulatory Visit: Payer: Medicare HMO | Admitting: *Deleted

## 2023-06-30 DIAGNOSIS — R293 Abnormal posture: Secondary | ICD-10-CM

## 2023-06-30 DIAGNOSIS — M6281 Muscle weakness (generalized): Secondary | ICD-10-CM

## 2023-06-30 DIAGNOSIS — M542 Cervicalgia: Secondary | ICD-10-CM
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
## 2023-07-06 ENCOUNTER — Encounter: Payer: Self-pay | Admitting: *Deleted

## 2023-07-06 ENCOUNTER — Ambulatory Visit: Payer: Medicare HMO | Attending: Physician Assistant | Admitting: *Deleted

## 2023-07-06 DIAGNOSIS — M542 Cervicalgia: Secondary | ICD-10-CM | POA: Diagnosis present

## 2023-07-06 DIAGNOSIS — R293 Abnormal posture: Secondary | ICD-10-CM | POA: Insufficient documentation

## 2023-07-06 DIAGNOSIS — M6281 Muscle weakness (generalized): Secondary | ICD-10-CM | POA: Insufficient documentation
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
## 2023-07-07 ENCOUNTER — Ambulatory Visit: Payer: Medicare HMO | Admitting: *Deleted

## 2023-07-10 ENCOUNTER — Ambulatory Visit: Payer: Medicare HMO

## 2023-07-10 DIAGNOSIS — R293 Abnormal posture: Secondary | ICD-10-CM

## 2023-07-10 DIAGNOSIS — M542 Cervicalgia: Secondary | ICD-10-CM
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
## 2023-07-16 ENCOUNTER — Other Ambulatory Visit: Payer: Self-pay | Admitting: "Endocrinology

## 2023-07-16 DIAGNOSIS — E1165 Type 2 diabetes mellitus with hyperglycemia: Secondary | ICD-10-CM

## 2023-07-16 LAB — COMPREHENSIVE METABOLIC PANEL
ALT: 60 [IU]/L — ABNORMAL HIGH (ref 0–32)
AST: 56 [IU]/L — ABNORMAL HIGH (ref 0–40)
Albumin: 4.2 g/dL (ref 3.9–4.9)
Alkaline Phosphatase: 127 [IU]/L — ABNORMAL HIGH (ref 44–121)
BUN/Creatinine Ratio: 13 (ref 12–28)
BUN: 12 mg/dL (ref 8–27)
Bilirubin Total: 0.4 mg/dL (ref 0.0–1.2)
CO2: 20 mmol/L (ref 20–29)
Calcium: 10.1 mg/dL (ref 8.7–10.3)
Chloride: 104 mmol/L (ref 96–106)
Creatinine, Ser: 0.92 mg/dL (ref 0.57–1.00)
Globulin, Total: 2.8 g/dL (ref 1.5–4.5)
Glucose: 302 mg/dL — ABNORMAL HIGH (ref 70–99)
Potassium: 4.7 mmol/L (ref 3.5–5.2)
Sodium: 140 mmol/L (ref 134–144)
Total Protein: 7 g/dL (ref 6.0–8.5)
eGFR: 70 mL/min/{1.73_m2} (ref >59)

## 2023-07-16 LAB — LIPID PANEL
Chol/HDL Ratio: 4.7 {ratio} — ABNORMAL HIGH (ref 0.0–4.4)
Cholesterol, Total: 205 mg/dL — ABNORMAL HIGH (ref 100–199)
HDL: 44 mg/dL (ref >39)
LDL Chol Calc (NIH): 117 mg/dL — ABNORMAL HIGH (ref 0–99)
Triglycerides: 250 mg/dL — ABNORMAL HIGH (ref 0–149)
VLDL Cholesterol Cal: 44 mg/dL — ABNORMAL HIGH (ref 5–40)

## 2023-07-16 LAB — TSH: TSH: 3.54 u[IU]/mL (ref 0.450–4.500)

## 2023-07-16 LAB — T4, FREE: Free T4: 1.12 ng/dL (ref 0.82–1.77)

## 2023-07-23 ENCOUNTER — Encounter: Payer: Self-pay | Admitting: "Endocrinology

## 2023-07-23 ENCOUNTER — Ambulatory Visit: Payer: Medicare HMO | Admitting: "Endocrinology

## 2023-07-23 VITALS — BP 108/70 | HR 68 | Ht 68.0 in | Wt 263.2 lb

## 2023-07-23 DIAGNOSIS — E1165 Type 2 diabetes mellitus with hyperglycemia: Secondary | ICD-10-CM | POA: Diagnosis not present

## 2023-07-23 DIAGNOSIS — E782 Mixed hyperlipidemia: Secondary | ICD-10-CM | POA: Diagnosis not present

## 2023-07-23 DIAGNOSIS — I10 Essential (primary) hypertension: Secondary | ICD-10-CM | POA: Diagnosis not present

## 2023-07-23 DIAGNOSIS — Z7985 Long-term (current) use of injectable non-insulin antidiabetic drugs: Secondary | ICD-10-CM

## 2023-07-23 MED ORDER — TIRZEPATIDE 10 MG/0.5ML ~~LOC~~ SOAJ: 10.0000 mg | SUBCUTANEOUS | 0 refills | Status: DC

## 2023-07-23 NOTE — Progress Notes (Signed)
07/23/2023, 1:00 PM                Endocrinology follow-up note  Subjective:    Patient ID: Tina Sampson, female    DOB: 11-03-1960.  Tina Sampson is being seen in follow-up in the management of currently uncontrolled symptomatic type 2 diabetes, hyperlipidemia, hypertension. PMD:  Tina Person, NP.   Past Medical History:  Diagnosis Date   Abdominal pain 10/15/2017   Diabetes mellitus    Diabetes mellitus, type II (HCC)    H/O knee surgery    Hyperlipidemia    IBS (irritable bowel syndrome)    Past Surgical History:  Procedure Laterality Date   ANKLE SURGERY Left    CARDIAC CATHETERIZATION     catherization     CERVICAL DISC SURGERY     CESAREAN SECTION     CHOLECYSTECTOMY     COLONOSCOPY WITH PROPOFOL N/A 11/20/2021   Procedure: COLONOSCOPY WITH PROPOFOL;  Surgeon: Dolores Frame, MD;  Location: [facility];  Service: Gastroenterology;  Laterality: N/A;  100   KNEE SURGERY     KNEE SURGERY     POLYPECTOMY  11/20/2021   Procedure: POLYPECTOMY;  Surgeon: Marguerita Merles, Reuel Boom, MD;  Location: [facility];  Service: Gastroenterology;;   TUBAL LIGATION     Social History   Socioeconomic History   Marital status: Married    Spouse name: Not on file   Number of children: Not on file   Years of education: Not on file   Highest education level: Not on file  Occupational History   Not on file  Tobacco Use   Smoking status: Never    Passive exposure: Current   Smokeless tobacco: Never  Vaping Use   Vaping status: Never Used  Substance and Sexual Activity   Alcohol use: No   Drug use: No   Sexual activity: Yes  Other Topics Concern   Not on file  Social History Narrative   Not on file   Social Drivers of Health   Financial Resource Strain: Low Risk  (07/11/2023)   Received from [facility]   Overall Financial Resource Strain (CARDIA)    Difficulty of Paying  Living Expenses: Not hard at all  Food Insecurity: No Food Insecurity (07/11/2023)   Received from [facility]   Hunger Vital Sign    Worried About Running Out of Food in the Last Year: Never true    Ran Out of Food in the Last Year: Never true  Transportation Needs: No Transportation Needs (07/11/2023)   Received from [facility] - Transportation    Lack of Transportation (Medical): No    Lack of Transportation (Non-Medical): No  Physical Activity: Unknown (07/11/2023)   Received from [facility]   Exercise Vital Sign    Days of Exercise per Week: 0 days    Minutes of Exercise per Session: Not on file  Stress: No Stress Concern Present (07/11/2023)   Received from [facility] - Occupational Stress Questionnaire    Feeling of Stress : Not at all  Social Connections: Socially Integrated (07/11/2023)   Received from [facility]   Social Network    How would  you rate your social network (family, work, friends)?: Good participation with social networks   Outpatient Encounter Medications as of 07/23/2023  Medication Sig   tirzepatide (MOUNJARO) 10 MG/0.5ML Pen Inject 10 mg into the skin once a week.   ACCU-CHEK GUIDE test strip TEST BLOOD SUGAR THREE TIMES DAILY   Accu-Chek Softclix Lancets lancets TEST BLOOD SUGAR THREE TIMES DAILY AS DIRECTED   albuterol (VENTOLIN HFA) 108 (90 Base) MCG/ACT inhaler Inhale 1-2 puffs into the lungs every 4 (four) hours as needed for wheezing or shortness of breath.   Alcohol Swabs (DROPSAFE ALCOHOL PREP) 70 % PADS USE BEFORE TESTING GLUCOSE AND INJECTING BYDUREON   ascorbic acid (VITAMIN C) 500 MG tablet Take 500 mg by mouth daily.   aspirin EC 81 MG tablet Take 81 mg daily by mouth.   B Complex Vitamins (VITAMIN B COMPLEX PO) Take 1 tablet by mouth daily.   Blood Glucose Monitoring Suppl (ACCU-CHEK GUIDE) w/Device KIT Use to test BG tid. DX e11.65   cholecalciferol (VITAMIN D3) 25 MCG (1000  UNIT) tablet Take 1,000 Units by mouth daily.   diphenhydramine-acetaminophen (TYLENOL PM) 25-500 MG TABS tablet Take 1 tablet by mouth at bedtime as needed (sleep/pain).   empagliflozin (JARDIANCE) 25 MG TABS tablet Take 1 tablet (25 mg total) by mouth daily.   EPINEPHrine 0.3 mg/0.3 mL IJ SOAJ injection Inject 0.3 mg into the muscle as needed for anaphylaxis.   hydrOXYzine (ATARAX/VISTARIL) 25 MG tablet Take 25 mg by mouth every 8 (eight) hours as needed for anxiety.   hyoscyamine (LEVSIN SL) 0.125 MG SL tablet Place 1 tablet (0.125 mg total) under the tongue every 6 (six) hours as needed.   metFORMIN (GLUCOPHAGE-XR) 500 MG 24 hr tablet TAKE ONE TABLET DAILY WITH BREAKFAST   Multiple Vitamin (MULTIVITAMIN) tablet Take 1 tablet daily by mouth.   omeprazole (PRILOSEC) 20 MG capsule Take 20 mg by mouth in the morning and at bedtime.   pramipexole (MIRAPEX) 0.5 MG tablet Take 0.5 mg by mouth 3 (three) times daily. One tid For RLS   propranolol (INDERAL) 10 MG tablet Take 10 mg by mouth 2 (two) times daily.   traZODone (DESYREL) 50 MG tablet Take 50 mg by mouth at bedtime.   venlafaxine XR (EFFEXOR-XR) 150 MG 24 hr capsule Take 150 mg by mouth daily.   [DISCONTINUED] tirzepatide (MOUNJARO) 7.5 MG/0.5ML Pen Inject 7.5 mg into the skin once a week.   No facility-administered encounter medications on file as of 07/23/2023.    ALLERGIES: Allergies  Allergen Reactions   Bee Venom Shortness Of Breath, Itching and Swelling    VACCINATION STATUS: Immunization History  Administered Date(s) Administered   Influenza Split 05/07/2013   Moderna Sars-Covid-2 Vaccination 10/05/2019, 11/02/2019, 08/28/2020    Diabetes She presents for her follow-up diabetic visit. She has type 2 diabetes mellitus. Onset time: She was diagnosed at approximate age of 48 years. Her disease course has been worsening. There are no hypoglycemic associated symptoms. Pertinent negatives for hypoglycemia include no confusion,  headaches, pallor or seizures. Pertinent negatives for diabetes include no blurred vision, no chest pain, no polydipsia, no polyphagia, no polyuria and no weight loss. There are no hypoglycemic complications. Symptoms are worsening. There are no diabetic complications. Risk factors for coronary artery disease include dyslipidemia, diabetes mellitus, obesity, sedentary lifestyle and post-menopausal. Her weight is decreasing steadily. She is following a generally unhealthy diet. When asked about meal planning, she reported none. She has not had a previous visit with a dietitian. She  never participates in exercise. Her home blood glucose trend is increasing steadily. Her breakfast blood glucose range is generally 140-180 mg/dl. Her bedtime blood glucose range is generally 140-180 mg/dl. Her overall blood glucose range is 140-180 mg/dl. (She presents with her meter is showing significantly above target glycemic profile averaging 244 mg per DL over the last 30 days.  Her previsit A1c is 8.3% increasing from 7.6%.    She does not report hypoglycemia.    ) An ACE inhibitor/angiotensin II receptor blocker is being taken. She does not see a podiatrist.Eye exam is current (She has not seen her ophthalmologist in 2 years, I urged to reschedule a visit.).  Hyperlipidemia This is a chronic problem. The current episode started more than 1 year ago. The problem is controlled. Exacerbating diseases include diabetes and obesity. Pertinent negatives include no chest pain, myalgias or shortness of breath. Current antihyperlipidemic treatment includes statins. Risk factors for coronary artery disease include diabetes mellitus, dyslipidemia, obesity, a sedentary lifestyle and post-menopausal.     Review of systems  Constitutional: + Progressive weight loss,  current  Body mass index is 40.02 kg/m. , no fatigue, no subjective hyperthermia, no subjective hypothermia   Objective:    BP 108/70   Pulse 68   Ht 5\' 8"  (1.727  m)   Wt 263 lb 3.2 oz (119.4 kg)   LMP 03/19/2011   BMI 40.02 kg/m   Wt Readings from Last 3 Encounters:  07/23/23 263 lb 3.2 oz (119.4 kg)  06/18/23 260 lb 8 oz (118.2 kg)  03/23/23 265 lb 6.4 oz (120.4 kg)    Physical Exam- Limited  Constitutional:  Body mass index is 40.02 kg/m. , not in acute distress, normal state of mind   Recent Results (from the past 2160 hours)  Comprehensive metabolic panel     Status: Abnormal   Collection Time: 07/15/23 10:33 AM  Result Value Ref Range   Glucose 302 (H) 70 - 99 mg/dL   BUN 12 8 - 27 mg/dL   Creatinine, Ser 1.61 0.57 - 1.00 mg/dL   eGFR 70 >09 UE/AVW/0.98   BUN/Creatinine Ratio 13 12 - 28   Sodium 140 134 - 144 mmol/L   Potassium 4.7 3.5 - 5.2 mmol/L   Chloride 104 96 - 106 mmol/L   CO2 20 20 - 29 mmol/L   Calcium 10.1 8.7 - 10.3 mg/dL   Total Protein 7.0 6.0 - 8.5 g/dL   Albumin 4.2 3.9 - 4.9 g/dL   Globulin, Total 2.8 1.5 - 4.5 g/dL   Bilirubin Total 0.4 0.0 - 1.2 mg/dL   Alkaline Phosphatase 127 (H) 44 - 121 IU/L   AST 56 (H) 0 - 40 IU/L   ALT 60 (H) 0 - 32 IU/L  Lipid panel     Status: Abnormal   Collection Time: 07/15/23 10:33 AM  Result Value Ref Range   Cholesterol, Total 205 (H) 100 - 199 mg/dL   Triglycerides 119 (H) 0 - 149 mg/dL   HDL 44 >14 mg/dL   VLDL Cholesterol Cal 44 (H) 5 - 40 mg/dL   LDL Chol Calc (NIH) 782 (H) 0 - 99 mg/dL   Chol/HDL Ratio 4.7 (H) 0.0 - 4.4 ratio    Comment:                                   T. Chol/HDL Ratio  Men  Women                               1/2 Avg.Risk  3.4    3.3                                   Avg.Risk  5.0    4.4                                2X Avg.Risk  9.6    7.1                                3X Avg.Risk 23.4   11.0   TSH     Status: None   Collection Time: 07/15/23 10:33 AM  Result Value Ref Range   TSH 3.540 0.450 - 4.500 uIU/mL  T4, free     Status: None   Collection Time: 07/15/23 10:33 AM  Result Value Ref  Range   Free T4 1.12 0.82 - 1.77 ng/dL     Lipid Panel     Component Value Date/Time   CHOL 205 (H) 07/15/2023 1033   TRIG 250 (H) 07/15/2023 1033   HDL 44 07/15/2023 1033   CHOLHDL 4.7 (H) 07/15/2023 1033   CHOLHDL 3.7 01/13/2020 1112   LDLCALC 117 (H) 07/15/2023 1033   LDLCALC 71 01/13/2020 1112    Assessment & Plan:   1. Uncontrolled type 2 diabetes mellitus with hyperglycemia (HCC)  - Tina Sampson has currently uncontrolled symptomatic type 2 DM since  62 years of age.  She presents with her meter is showing significantly above target glycemic profile averaging 244 mg per DL over the last 30 days.  Her previsit A1c is 8.3% increasing from 7.6%.    She does not report hypoglycemia.      Recent labs reviewed.  -her diabetes is complicated by obesity/sedentary life and Tina Sampson remains at a high risk for more acute and chronic complications which include CAD, CVA, CKD, retinopathy, and neuropathy. These are all discussed in detail with the patient.  - I have counseled her on diet management and weight loss, by adopting a carbohydrate restricted/protein rich diet.  She has disengaged from lifestyle medicine, gaining weight.  - she acknowledges that there is a room for improvement in her food and drink choices. - Suggestion is made for her to avoid simple carbohydrates  from her diet including Cakes, Sweet Desserts, Ice Cream, Soda (diet and regular), Sweet Tea, Candies, Chips, Cookies, Store Bought Juices, Alcohol , Artificial Sweeteners,  Coffee Creamer, and "Sugar-free" Products, Lemonade. This will help patient to have more stable blood glucose profile and potentially avoid unintended weight gain.  The following Lifestyle Medicine recommendations according to American College of Lifestyle Medicine  [facility]) were discussed and and offered to patient and she  agrees to start the journey:  A. Whole Foods, Plant-Based Nutrition comprising of fruits and vegetables,  plant-based proteins, whole-grain carbohydrates was discussed in detail with the patient.   A list for source of those nutrients were also provided to the patient.  Patient will use only water or unsweetened tea for hydration. B.  The need to stay away from risky substances including alcohol, smoking; obtaining 7  to 9 hours of restorative sleep, at least 150 minutes of moderate intensity exercise weekly, the importance of healthy social connections,  and stress management techniques were discussed. C.  A full color page of  Calorie density of various food groups per pound showing examples of each food groups was provided to the patient.    - I encouraged her to switch to  unprocessed or minimally processed complex starch and increased protein intake (animal or plant source), fruits, and vegetables.   - I have approached her with the following individualized plan to manage diabetes and patient agrees:   -In light of her presentation with uncontrolled glycemic profile with increasing A1c of 8.3%, she is approached for a higher dose of Mounjaro and she agrees.    I discussed and increase her Mounjaro to 10 mg subcutaneously weekly.  Side effects and precautions discussed with her.    She is also advised to continue Jardiance 25 mg p.o. daily at breakfast along with metformin 500 mg p.o. daily at breakfast.   She is willing and advised to continue monitoring blood glucose twice a day-daily before breakfast and at bedtime and encouraged to call clinic for hypoglycemia under 70 or hyperglycemia above 200 mg per DL.    2) BP/HTN -Her blood pressure is controlled to target.  She remains off of lisinopril.  Her blood pressure today is 108/72. Marland Kitchen      3) Lipids/HPL:   Her most recent lipid panel showed worsening LDL to 117 from 65.  She could not confirm consistency in taking her statin.  I advised her to continue pravastatin 40 mg p.o. nightly.       Side effects and precautions discussed with her.      4) weight management: Her current BMI is 40.02- consistent with a category of morbid obesity.  This clearly interferes in the attempt to control her diabetes.   She is a candidate for modest weight loss.   5) Chronic Care/Health Maintenance:  -she  is on  Statin medications and   lisinopril is encouraged to continue to follow up with Ophthalmology, Dentist,  Podiatrist at least yearly or according to recommendations, and advised to  stay away from smoking. I have recommended yearly flu vaccine and pneumonia vaccination at least every 5 years; moderate intensity exercise for up to 150 minutes weekly; and  sleep for at least 7 hours a day.  Her screen ABI was abnormal in March 2022.  She was evaluated by vascular surgery with better testing showing no evidence of occlusive peripheral arterial disease.   - I advised patient to maintain close follow up with Tina Person, NP for primary care needs.   I spent  41  minutes in the care of the patient today including review of labs from CMP, Lipids, Thyroid Function, Hematology (current and previous including abstractions from other facilities); face-to-face time discussing  her blood glucose readings/logs, discussing hypoglycemia and hyperglycemia episodes and symptoms, medications doses, her options of short and long term treatment based on the latest standards of care / guidelines;  discussion about incorporating lifestyle medicine;  and documenting the encounter. Risk reduction counseling performed per USPSTF guidelines to reduce  obesity and cardiovascular risk factors.     Please refer to Patient Instructions for Blood Glucose Monitoring and Insulin/Medications Dosing Guide"  in media tab for additional information. Please  also refer to " Patient Self Inventory" in the Media  tab for reviewed elements of pertinent patient history.  Tina Sampson participated in the discussions, expressed understanding, and voiced agreement with the  above plans.  All questions were answered to her satisfaction. she is encouraged to contact clinic should she have any questions or concerns prior to her return visit.    Follow up plan: - Return in about 3 months (around 10/21/2023) for Bring Meter/CGM Device/Logs- A1c in Office.  Tina Lunch, MD [employer] [facility] [address] [place], [place] [address] Phone: [phone]  Fax: [phone]    07/23/2023, 1:00 PM  This note was partially dictated with voice recognition software. Similar sounding words can be transcribed inadequately or may not  be corrected upon review.

## 2023-07-23 NOTE — Patient Instructions (Signed)

## 2023-08-12 ENCOUNTER — Other Ambulatory Visit: Payer: Self-pay | Admitting: Nurse Practitioner

## 2023-09-17 ENCOUNTER — Other Ambulatory Visit (HOSPITAL_COMMUNITY): Payer: Self-pay

## 2023-09-22 ENCOUNTER — Other Ambulatory Visit (HOSPITAL_COMMUNITY): Payer: Self-pay

## 2023-09-22 MED ORDER — VENLAFAXINE HCL ER 150 MG PO CP24
150.0000 mg | ORAL_CAPSULE | Freq: Every morning | ORAL | 3 refills | Status: AC
  Filled 2023-09-25: qty 90, 90d supply, fill #0
  Filled 2024-01-18: qty 90, 90d supply, fill #1

## 2023-09-22 MED ORDER — METFORMIN HCL ER 500 MG PO TB24
500.0000 mg | ORAL_TABLET | Freq: Every day | ORAL | 1 refills | Status: DC
  Filled 2023-09-25: qty 90, 90d supply, fill #0

## 2023-09-22 MED ORDER — HYDROXYZINE HCL 25 MG PO TABS: 25.0000 mg | ORAL_TABLET | Freq: Three times a day (TID) | ORAL | 11 refills | Status: AC | PRN

## 2023-09-22 MED ORDER — GLUCOSE BLOOD VI STRP
ORAL_STRIP | Freq: Three times a day (TID) | 3 refills | Status: DC
  Filled 2023-09-25: qty 250, 83d supply, fill #0
  Filled 2023-10-16 – 2023-10-30 (×2): qty 300, 100d supply, fill #0

## 2023-09-22 MED ORDER — OMEPRAZOLE 20 MG PO CPDR
40.0000 mg | DELAYED_RELEASE_CAPSULE | Freq: Every morning | ORAL | 3 refills | Status: DC
  Filled 2023-09-25: qty 180, 90d supply, fill #0

## 2023-09-23 ENCOUNTER — Other Ambulatory Visit (HOSPITAL_BASED_OUTPATIENT_CLINIC_OR_DEPARTMENT_OTHER): Payer: Self-pay

## 2023-09-23 ENCOUNTER — Other Ambulatory Visit (HOSPITAL_COMMUNITY): Payer: Self-pay

## 2023-09-25 ENCOUNTER — Other Ambulatory Visit (HOSPITAL_COMMUNITY): Payer: Self-pay

## 2023-09-25 ENCOUNTER — Other Ambulatory Visit (HOSPITAL_BASED_OUTPATIENT_CLINIC_OR_DEPARTMENT_OTHER): Payer: Self-pay

## 2023-09-25 ENCOUNTER — Other Ambulatory Visit: Payer: Self-pay

## 2023-10-13 ENCOUNTER — Telehealth: Payer: Self-pay

## 2023-10-13 NOTE — Telephone Encounter (Signed)
 Pt called stating she received a cortisone injection yesterday. States yesterday evening her BG was 438 at 6:50pm and 399 around 11:00pm. States her FBG this morning was 221. Pt is taking Jardiance 25mg  every day, Mounjaro 10mg  SQ weekly and Metformin XR 500mg  every day. Discussed pt's readings and BG results with Dr.Nida. Advised pt to increase her Metformin XR to 1000mg  every day and to continue her Mounjaro and Jardiance as previously prescribed per Dr.Nida's orders. Pt voiced understanding.

## 2023-10-15 ENCOUNTER — Ambulatory Visit (INDEPENDENT_AMBULATORY_CARE_PROVIDER_SITE_OTHER): Payer: Medicare HMO | Admitting: Gastroenterology

## 2023-10-15 ENCOUNTER — Encounter (INDEPENDENT_AMBULATORY_CARE_PROVIDER_SITE_OTHER): Payer: Self-pay | Admitting: Gastroenterology

## 2023-10-15 VITALS — BP 122/82 | HR 85 | Temp 97.5°F | Ht 68.0 in | Wt 251.8 lb

## 2023-10-15 DIAGNOSIS — G8929 Other chronic pain: Secondary | ICD-10-CM

## 2023-10-15 DIAGNOSIS — R1310 Dysphagia, unspecified: Secondary | ICD-10-CM | POA: Diagnosis not present

## 2023-10-15 DIAGNOSIS — R1031 Right lower quadrant pain: Secondary | ICD-10-CM | POA: Diagnosis not present

## 2023-10-15 DIAGNOSIS — K219 Gastro-esophageal reflux disease without esophagitis: Secondary | ICD-10-CM

## 2023-10-15 MED ORDER — HYOSCYAMINE SULFATE 0.125 MG SL SUBL: 0.1250 mg | SUBLINGUAL_TABLET | Freq: Four times a day (QID) | SUBLINGUAL | 0 refills | Status: AC | PRN

## 2023-10-15 MED ORDER — PANTOPRAZOLE SODIUM 40 MG PO TBEC: 40.0000 mg | DELAYED_RELEASE_TABLET | Freq: Every day | ORAL | 1 refills | Status: DC

## 2023-10-15 NOTE — Progress Notes (Signed)
 Referring Provider: Bernadene Person, * Primary Care Physician:  Bernadene Person, NP Primary GI Physician: Dr. Levon Hedger   Chief Complaint  Patient presents with   Follow-up    States that she has been having a lot of indigestion and they even water makes her belch. Currently taking omeprazole 20 mg bid. States that the abdominal pain comes and goes and if she sits for a long time.    HPI:   Tina Sampson is a 63 y.o. female with past medical history of DM type 2, HLD, GERD, IBS and chronic abdominal pain   Patient presenting today for:  abdominal pain, GERD and dysphagia  Last seen November 2024, at that time patient having some continued right lower quadrant pain occurring less frequently than before.  Having 1-2 bowel movements per day does not think she tried Levsin that was prescribed last visit.  Trying to lose some weight.  Omeprazole 40 mg twice daily doing well on this.  Patient recommended trial Levsin every 6 hours as needed, consider Elavil if right lower quadrant pain not improved with antispasmodics, continue omeprazole 20 mg twice daily  Present: Has not had much RLQ Pain recently. Never tried levsin as pharmacy stated they never got this. She denies rectal bleeding, melena, constipation or diarrhea. Pain tends to occur usually when she has been siting more.   Currently on omeprazole 20mg  BID, having more belching recently. Denies heartburn or acid regurgitation. She has occasional dysphagia where foods feel they want to sit in her throat. She has been maintained on omeprazole for a while but has never had an EGD. She does not recall what her initial symptoms were when she was started on PPI previously. No nausea or vomiting. No changes in appetite early satiety or unintentional weight loss.    4/23, CT pelvis with contrast: No acute abnormality in the abdomen/pelvis. 2. Moderate colonic stool burden with mild colonic redundancy, can be seen with  constipation. No bowel obstruction or inflammation. 3. Minimal sigmoid colonic diverticulosis without diverticulitis. 4. Small hiatal hernia with minimal enteric contrast in the distal esophagus, can be seen with reflux or delayed transit. 5. Unchanged splenomegaly. Last Colonoscopy: 11/20/21 Four 3 to 5 mm polyps in the transverse colon and in the ascending colon - One 4 mm polyp in the descending colon - Diverticulosis in the sigmoid colon and in the descending colon. - The distal rectum and anal verge are normal on retroflexion view. (4 tubular adenomas, one SSL) Last Endoscopy:never   Recommendations:  Repeat colonoscopy in April 2026  Filed Weights   10/15/23 1439  Weight: 251 lb 12.8 oz (114.2 kg)     Past Medical History:  Diagnosis Date   Abdominal pain 10/15/2017   Diabetes mellitus    Diabetes mellitus, type II (HCC)    H/O knee surgery    Hyperlipidemia    IBS (irritable bowel syndrome)     Past Surgical History:  Procedure Laterality Date   ANKLE SURGERY Left    CARDIAC CATHETERIZATION     catherization     CERVICAL DISC SURGERY     CESAREAN SECTION     CHOLECYSTECTOMY     COLONOSCOPY WITH PROPOFOL N/A 11/20/2021   Procedure: COLONOSCOPY WITH PROPOFOL;  Surgeon: Dolores Frame, MD;  [facility];  Service: Gastroenterology;  Laterality: N/A;  100   KNEE SURGERY     KNEE SURGERY     POLYPECTOMY  11/20/2021   Procedure: POLYPECTOMY;  Surgeon: Levon Hedger  Alisia Ferrari, MD;  [facility];  Service: Gastroenterology;;   TUBAL LIGATION      Current Outpatient Medications  Medication Sig Dispense Refill   ACCU-CHEK GUIDE test strip TEST BLOOD SUGAR THREE TIMES DAILY 300 strip 3   Accu-Chek Softclix Lancets lancets TEST BLOOD SUGAR THREE TIMES DAILY AS DIRECTED 200 each 2   albuterol (VENTOLIN HFA) 108 (90 Base) MCG/ACT inhaler Inhale 1-2 puffs into the lungs every 4 (four) hours as needed for wheezing or shortness of breath.      Alcohol Swabs (DROPSAFE ALCOHOL PREP) 70 % PADS USE BEFORE TESTING GLUCOSE AND INJECTING BYDUREON 300 each 2   ascorbic acid (VITAMIN C) 500 MG tablet Take 500 mg by mouth daily.     aspirin EC 81 MG tablet Take 81 mg daily by mouth.     B Complex Vitamins (VITAMIN B COMPLEX PO) Take 1 tablet by mouth daily.     Blood Glucose Monitoring Suppl (ACCU-CHEK GUIDE) w/Device KIT Use to test BG tid. DX e11.65 1 kit 0   cholecalciferol (VITAMIN D3) 25 MCG (1000 UNIT) tablet Take 1,000 Units by mouth daily.     diphenhydramine-acetaminophen (TYLENOL PM) 25-500 MG TABS tablet Take 1 tablet by mouth at bedtime as needed (sleep/pain).     EPINEPHrine 0.3 mg/0.3 mL IJ SOAJ injection Inject 0.3 mg into the muscle as needed for anaphylaxis.     glucose blood test strip Use to test blood sugar three times a day 300 each 3   hydrOXYzine (ATARAX) 25 MG tablet Take 1 tablet (25 mg total) by mouth 3 (three) times daily as needed for itching 90 tablet 11   hyoscyamine (LEVSIN SL) 0.125 MG SL tablet Place 1 tablet (0.125 mg total) under the tongue every 6 (six) hours as needed. 90 tablet 1   JARDIANCE 25 MG TABS tablet TAKE ONE TABLET DAILY 30 tablet 1   metFORMIN (GLUCOPHAGE-XR) 500 MG 24 hr tablet TAKE ONE TABLET DAILY WITH BREAKFAST (Patient taking differently: Take 1,000 mg by mouth daily with breakfast.) 30 tablet 2   Multiple Vitamin (MULTIVITAMIN) tablet Take 1 tablet daily by mouth.     omeprazole (PRILOSEC) 20 MG capsule Take 20 mg by mouth in the morning and at bedtime.     pramipexole (MIRAPEX) 0.5 MG tablet Take 0.5 mg by mouth 3 (three) times daily. One tid For RLS     propranolol (INDERAL) 10 MG tablet Take 10 mg by mouth 2 (two) times daily.     tirzepatide (MOUNJARO) 10 MG/0.5ML Pen Inject 10 mg into the skin once a week. 6 mL 0   traZODone (DESYREL) 50 MG tablet Take 50 mg by mouth at bedtime.     venlafaxine XR (EFFEXOR-XR) 150 MG 24 hr capsule Take 1 capsule (150 mg total) by mouth in the morning. 90  capsule 3   No current facility-administered medications for this visit.    Allergies as of 10/15/2023 - Review Complete 10/15/2023  Allergen Reaction Noted   Bee venom Shortness Of Breath, Itching, and Swelling 05/15/2011    Social History   Socioeconomic History   Marital status: Married    Spouse name: Not on file   Number of children: Not on file   Years of education: Not on file   Highest education level: Not on file  Occupational History   Not on file  Tobacco Use   Smoking status: Never    Passive exposure: Current   Smokeless tobacco: Never  Vaping Use  Vaping status: Never Used  Substance and Sexual Activity   Alcohol use: No   Drug use: No   Sexual activity: Yes  Other Topics Concern   Not on file  Social History Narrative   Not on file   Social Drivers of Health   Financial Resource Strain: Low Risk  (07/11/2023)   Received from [facility]   Overall Financial Resource Strain (CARDIA)    Difficulty of Paying Living Expenses: Not hard at all  Food Insecurity: No Food Insecurity (07/11/2023)   Received from [facility]   Hunger Vital Sign    Worried About Running Out of Food in the Last Year: Never true    Ran Out of Food in the Last Year: Never true  Transportation Needs: No Transportation Needs (07/11/2023)   Received from [facility] - Transportation    Lack of Transportation (Medical): No    Lack of Transportation (Non-Medical): No  Physical Activity: Unknown (07/11/2023)   Received from [facility]   Exercise Vital Sign    Days of Exercise per Week: 0 days    Minutes of Exercise per Session: Not on file  Stress: No Stress Concern Present (07/11/2023)   Received from [facility] - Occupational Stress Questionnaire    Feeling of Stress : Not at all  Social Connections: Socially Integrated (07/11/2023)   Received from [facility]   Social Network    How would you rate your social  network (family, work, friends)?: Good participation with social networks    Review of systems General: negative for malaise, night sweats, fever, chills, weight loss Neck: Negative for lumps, goiter, pain and significant neck swelling Resp: Negative for cough, wheezing, dyspnea at rest CV: Negative for chest pain, leg swelling, palpitations, orthopnea GI: denies melena, hematochezia, nausea, vomiting, diarrhea, constipation, odyonophagia, early satiety or unintentional weight loss. +dysphagia +belching +RLQ Pain  The remainder of the review of systems is noncontributory.  Physical Exam: BP 122/82 (BP Location: Right Arm, Patient Position: Sitting, Cuff Size: Large)   Pulse 85   Temp (!) 97.5 F (36.4 C) (Oral)   Ht 5\' 8"  (1.727 m)   Wt 251 lb 12.8 oz (114.2 kg)   LMP 03/19/2011   SpO2 92%   BMI 38.29 kg/m  General:   Alert and oriented. No distress noted. Pleasant and cooperative.  Head:  Normocephalic and atraumatic. Eyes:  Conjuctiva clear without scleral icterus. Mouth:  Oral mucosa pink and moist. Good dentition. No lesions. Heart: Normal rate and rhythm, s1 and s2 heart sounds present.  Lungs: Clear lung sounds in all lobes. Respirations equal and unlabored. Abdomen:  +BS, soft,  and non-distended. TTP of diffuse lower abdomen though No rebound or guarding. No HSM or masses noted. Neurologic:  Alert and  oriented x4 Psych:  Alert and cooperative. Normal mood and affect.  Invalid input(s): "6 MONTHS"   ASSESSMENT: KENIKA SAHM is a 63 y.o. female presenting today for abdominal pain and GERD   Abdominal pain: Patient with chronic, intermittent right lower quadrant pain since 2019 with extensive workup as outlined above.  Pain has been suspected secondary to IBS/functional aspect in the past.  Could also be some nerve damage/scar tissue from multiple previous abdominal surgeries.  She has tried dicyclomine in the past with much improvement.  Levsin has been prescribed in  the past though she reports pharmacy states he never got the medication.  We also discussed low-dose TCA if  antispasmodics do not provide improvement.  Fortunately she notes right lower quadrant pain has not occurred recently and appears to be less severe than previous.  She has no constipation or diarrhea, denies rectal bleeding, melena.  I will send Levsin again for her to try as needed.  I requested she let me know if pharmacy has any issues getting this medication to her.  GERD: Patient with long history of GERD, has been maintained on omeprazole 20 mg twice daily for quite some time with notable recent increase in belching though she denies any heartburn or acid regurgitation.  She notes some occasional dysphag[place] get [place].  She has no nausea or vomiting, denies early satiety or weight loss.  At this time would recommend stopping omeprazole and starting Protonix 40 mg once daily, would also recommend proceeding with EGD for dysphagia and because she has never had endoscopic evaluation of the upper GI tract, would be beneficial to rule out any presence of Barrett's esophagus given long history of GERD. Indications, risks and benefits of procedure discussed in detail with patient. Patient verbalized understanding and is in agreement to proceed with EGD.    PLAN:  -stop omeprazole  -Schedule EGD +/- dilation-ASA III  -protonix 40mg  daily, 30 minutes prior to breakfast  -re send levsin 0.125mg  every 6 hours PRN -consider low dose TCA if anti spasmodics do not improve RLQ Pain   All questions were answered, patient verbalized understanding and is in agreement with plan as outlined above.   Follow Up: 3-4 months   Jamelia Varano L. Jeanmarie Hubert, MSN, APRN, AGNP-C Adult-Gerontology Nurse Practitioner [facility] for GI Diseases  I have reviewed the note and agree with the APP's assessment as described in this progress note  Katrinka Blazing, MD Gastroenterology and  Hepatology [facility]astroenterology

## 2023-10-15 NOTE — H&P (View-Only) (Signed)
 Referring Provider: Bernadene Person, * Primary Care Physician:  Bernadene Person, NP Primary GI Physician: Dr. Levon Hedger   Chief Complaint  Patient presents with   Follow-up    States that she has been having a lot of indigestion and they even water makes her belch. Currently taking omeprazole 20 mg bid. States that the abdominal pain comes and goes and if she sits for a long time.    HPI:   Tina Sampson is a 63 y.o. female with past medical history of DM type 2, HLD, GERD, IBS and chronic abdominal pain   Patient presenting today for:  abdominal pain, GERD and dysphagia  Last seen November 2024, at that time patient having some continued right lower quadrant pain occurring less frequently than before.  Having 1-2 bowel movements per day does not think she tried Levsin that was prescribed last visit.  Trying to lose some weight.  Omeprazole 40 mg twice daily doing well on this.  Patient recommended trial Levsin every 6 hours as needed, consider Elavil if right lower quadrant pain not improved with antispasmodics, continue omeprazole 20 mg twice daily  Present: Has not had much RLQ Pain recently. Never tried levsin as pharmacy stated they never got this. She denies rectal bleeding, melena, constipation or diarrhea. Pain tends to occur usually when she has been siting more.   Currently on omeprazole 20mg  BID, having more belching recently. Denies heartburn or acid regurgitation. She has occasional dysphagia where foods feel they want to sit in her throat. She has been maintained on omeprazole for a while but has never had an EGD. She does not recall what her initial symptoms were when she was started on PPI previously. No nausea or vomiting. No changes in appetite early satiety or unintentional weight loss.    4/23, CT pelvis with contrast: No acute abnormality in the abdomen/pelvis. 2. Moderate colonic stool burden with mild colonic redundancy, can be seen with  constipation. No bowel obstruction or inflammation. 3. Minimal sigmoid colonic diverticulosis without diverticulitis. 4. Small hiatal hernia with minimal enteric contrast in the distal esophagus, can be seen with reflux or delayed transit. 5. Unchanged splenomegaly. Last Colonoscopy: 11/20/21 Four 3 to 5 mm polyps in the transverse colon and in the ascending colon - One 4 mm polyp in the descending colon - Diverticulosis in the sigmoid colon and in the descending colon. - The distal rectum and anal verge are normal on retroflexion view. (4 tubular adenomas, one SSL) Last Endoscopy:never   Recommendations:  Repeat colonoscopy in April 2026  Filed Weights   10/15/23 1439  Weight: 251 lb 12.8 oz (114.2 kg)     Past Medical History:  Diagnosis Date   Abdominal pain 10/15/2017   Diabetes mellitus    Diabetes mellitus, type II (HCC)    H/O knee surgery    Hyperlipidemia    IBS (irritable bowel syndrome)     Past Surgical History:  Procedure Laterality Date   ANKLE SURGERY Left    CARDIAC CATHETERIZATION     catherization     CERVICAL DISC SURGERY     CESAREAN SECTION     CHOLECYSTECTOMY     COLONOSCOPY WITH PROPOFOL N/A 11/20/2021   Procedure: COLONOSCOPY WITH PROPOFOL;  Surgeon: Dolores Frame, MD;  [facility];  Service: Gastroenterology;  Laterality: N/A;  100   KNEE SURGERY     KNEE SURGERY     POLYPECTOMY  11/20/2021   Procedure: POLYPECTOMY;  Surgeon: Levon Hedger  Alisia Ferrari, MD;  [facility];  Service: Gastroenterology;;   TUBAL LIGATION      Current Outpatient Medications  Medication Sig Dispense Refill   ACCU-CHEK GUIDE test strip TEST BLOOD SUGAR THREE TIMES DAILY 300 strip 3   Accu-Chek Softclix Lancets lancets TEST BLOOD SUGAR THREE TIMES DAILY AS DIRECTED 200 each 2   albuterol (VENTOLIN HFA) 108 (90 Base) MCG/ACT inhaler Inhale 1-2 puffs into the lungs every 4 (four) hours as needed for wheezing or shortness of breath.      Alcohol Swabs (DROPSAFE ALCOHOL PREP) 70 % PADS USE BEFORE TESTING GLUCOSE AND INJECTING BYDUREON 300 each 2   ascorbic acid (VITAMIN C) 500 MG tablet Take 500 mg by mouth daily.     aspirin EC 81 MG tablet Take 81 mg daily by mouth.     B Complex Vitamins (VITAMIN B COMPLEX PO) Take 1 tablet by mouth daily.     Blood Glucose Monitoring Suppl (ACCU-CHEK GUIDE) w/Device KIT Use to test BG tid. DX e11.65 1 kit 0   cholecalciferol (VITAMIN D3) 25 MCG (1000 UNIT) tablet Take 1,000 Units by mouth daily.     diphenhydramine-acetaminophen (TYLENOL PM) 25-500 MG TABS tablet Take 1 tablet by mouth at bedtime as needed (sleep/pain).     EPINEPHrine 0.3 mg/0.3 mL IJ SOAJ injection Inject 0.3 mg into the muscle as needed for anaphylaxis.     glucose blood test strip Use to test blood sugar three times a day 300 each 3   hydrOXYzine (ATARAX) 25 MG tablet Take 1 tablet (25 mg total) by mouth 3 (three) times daily as needed for itching 90 tablet 11   hyoscyamine (LEVSIN SL) 0.125 MG SL tablet Place 1 tablet (0.125 mg total) under the tongue every 6 (six) hours as needed. 90 tablet 1   JARDIANCE 25 MG TABS tablet TAKE ONE TABLET DAILY 30 tablet 1   metFORMIN (GLUCOPHAGE-XR) 500 MG 24 hr tablet TAKE ONE TABLET DAILY WITH BREAKFAST (Patient taking differently: Take 1,000 mg by mouth daily with breakfast.) 30 tablet 2   Multiple Vitamin (MULTIVITAMIN) tablet Take 1 tablet daily by mouth.     omeprazole (PRILOSEC) 20 MG capsule Take 20 mg by mouth in the morning and at bedtime.     pramipexole (MIRAPEX) 0.5 MG tablet Take 0.5 mg by mouth 3 (three) times daily. One tid For RLS     propranolol (INDERAL) 10 MG tablet Take 10 mg by mouth 2 (two) times daily.     tirzepatide (MOUNJARO) 10 MG/0.5ML Pen Inject 10 mg into the skin once a week. 6 mL 0   traZODone (DESYREL) 50 MG tablet Take 50 mg by mouth at bedtime.     venlafaxine XR (EFFEXOR-XR) 150 MG 24 hr capsule Take 1 capsule (150 mg total) by mouth in the morning. 90  capsule 3   No current facility-administered medications for this visit.    Allergies as of 10/15/2023 - Review Complete 10/15/2023  Allergen Reaction Noted   Bee venom Shortness Of Breath, Itching, and Swelling 05/15/2011    Social History   Socioeconomic History   Marital status: Married    Spouse name: Not on file   Number of children: Not on file   Years of education: Not on file   Highest education level: Not on file  Occupational History   Not on file  Tobacco Use   Smoking status: Never    Passive exposure: Current   Smokeless tobacco: Never  Vaping Use  Vaping status: Never Used  Substance and Sexual Activity   Alcohol use: No   Drug use: No   Sexual activity: Yes  Other Topics Concern   Not on file  Social History Narrative   Not on file   Social Drivers of Health   Financial Resource Strain: Low Risk  (07/11/2023)   Received from [facility]   Overall Financial Resource Strain (CARDIA)    Difficulty of Paying Living Expenses: Not hard at all  Food Insecurity: No Food Insecurity (07/11/2023)   Received from [facility]   Hunger Vital Sign    Worried About Running Out of Food in the Last Year: Never true    Ran Out of Food in the Last Year: Never true  Transportation Needs: No Transportation Needs (07/11/2023)   Received from [facility] - Transportation    Lack of Transportation (Medical): No    Lack of Transportation (Non-Medical): No  Physical Activity: Unknown (07/11/2023)   Received from [facility]   Exercise Vital Sign    Days of Exercise per Week: 0 days    Minutes of Exercise per Session: Not on file  Stress: No Stress Concern Present (07/11/2023)   Received from [facility] - Occupational Stress Questionnaire    Feeling of Stress : Not at all  Social Connections: Socially Integrated (07/11/2023)   Received from [facility]   Social Network    How would you rate your social  network (family, work, friends)?: Good participation with social networks    Review of systems General: negative for malaise, night sweats, fever, chills, weight loss Neck: Negative for lumps, goiter, pain and significant neck swelling Resp: Negative for cough, wheezing, dyspnea at rest CV: Negative for chest pain, leg swelling, palpitations, orthopnea GI: denies melena, hematochezia, nausea, vomiting, diarrhea, constipation, odyonophagia, early satiety or unintentional weight loss. +dysphagia +belching +RLQ Pain  The remainder of the review of systems is noncontributory.  Physical Exam: BP 122/82 (BP Location: Right Arm, Patient Position: Sitting, Cuff Size: Large)   Pulse 85   Temp (!) 97.5 F (36.4 C) (Oral)   Ht 5\' 8"  (1.727 m)   Wt 251 lb 12.8 oz (114.2 kg)   LMP 03/19/2011   SpO2 92%   BMI 38.29 kg/m  General:   Alert and oriented. No distress noted. Pleasant and cooperative.  Head:  Normocephalic and atraumatic. Eyes:  Conjuctiva clear without scleral icterus. Mouth:  Oral mucosa pink and moist. Good dentition. No lesions. Heart: Normal rate and rhythm, s1 and s2 heart sounds present.  Lungs: Clear lung sounds in all lobes. Respirations equal and unlabored. Abdomen:  +BS, soft,  and non-distended. TTP of diffuse lower abdomen though No rebound or guarding. No HSM or masses noted. Neurologic:  Alert and  oriented x4 Psych:  Alert and cooperative. Normal mood and affect.  Invalid input(s): "6 MONTHS"   ASSESSMENT: Tina Sampson is a 63 y.o. female presenting today for abdominal pain and GERD   Abdominal pain: Patient with chronic, intermittent right lower quadrant pain since 2019 with extensive workup as outlined above.  Pain has been suspected secondary to IBS/functional aspect in the past.  Could also be some nerve damage/scar tissue from multiple previous abdominal surgeries.  She has tried dicyclomine in the past with much improvement.  Levsin has been prescribed in  the past though she reports pharmacy states he never got the medication.  We also discussed low-dose TCA if  antispasmodics do not provide improvement.  Fortunately she notes right lower quadrant pain has not occurred recently and appears to be less severe than previous.  She has no constipation or diarrhea, denies rectal bleeding, melena.  I will send Levsin again for her to try as needed.  I requested she let me know if pharmacy has any issues getting this medication to her.  GERD: Patient with long history of GERD, has been maintained on omeprazole 20 mg twice daily for quite some time with notable recent increase in belching though she denies any heartburn or acid regurgitation.  She notes some [place] dysphag[place].  She has no nausea or vomiting, denies early satiety or weight loss.  At this time would recommend stopping omeprazole and starting Protonix 40 mg once daily, would also recommend proceeding with EGD for dysphagia and because she has never had endoscopic evaluation of the upper GI tract, would be beneficial to rule out any presence of Barrett's esophagus given long history of GERD. Indications, risks and benefits of procedure discussed in detail with patient. Patient verbalized understanding and is in agreement to proceed with EGD.    PLAN:  -stop omeprazole  -Schedule EGD +/- dilation-ASA III  -protonix 40mg  daily, 30 minutes prior to breakfast  -re send levsin 0.125mg  every 6 hours PRN -consider low dose TCA if anti spasmodics do not improve RLQ Pain   All questions were answered, patient verbalized understanding and is in agreement with plan as outlined above.   Follow Up: 3-4 months   Fionnuala Hemmerich L. Jeanmarie Hubert, MSN, APRN, AGNP-C Adult-Gerontology Nurse Practitioner [facility] for GI Diseases  I have reviewed the note and agree with the APP's assessment as described in this progress note  Katrinka Blazing, MD Gastroenterology and  Hepatology [facility] Gastroenterology

## 2023-10-15 NOTE — Patient Instructions (Signed)
 Please stop omeprazole and start Protonix 40 mg once daily, take this 30 minutes prior to breakfast in the mornings Be mindful of greasy, spicy, tomato/citrus based foods, caffeine, chocolate, alcohol as these can increase reflux symptoms Avoid eating late and stay upright 2 to 3 hours after eating prior to lying down Scheduled for upper endoscopy as you have history of GERD and has never had endoscopic evaluation.  For your ongoing abdominal pain I have sent Levsin to take up to every 6 hours as needed, please let me know if your pharmacy has any issues getting this for you  Follow-up 3-4 months  It was a pleasure to see you today. I want to create trusting relationships with patients and provide genuine, compassionate, and quality care. I truly value your feedback! please be on the lookout for a survey regarding your visit with me today. I appreciate your input about our visit and your time in completing this!    Aibhlinn Kalmar L. Jeanmarie Hubert, MSN, APRN, AGNP-C Adult-Gerontology Nurse Practitioner [facility] at [facility]

## 2023-10-17 ENCOUNTER — Other Ambulatory Visit (HOSPITAL_COMMUNITY): Payer: Self-pay

## 2023-10-17 ENCOUNTER — Other Ambulatory Visit (HOSPITAL_BASED_OUTPATIENT_CLINIC_OR_DEPARTMENT_OTHER): Payer: Self-pay

## 2023-10-19 ENCOUNTER — Telehealth (INDEPENDENT_AMBULATORY_CARE_PROVIDER_SITE_OTHER): Payer: Self-pay | Admitting: Gastroenterology

## 2023-10-19 NOTE — Telephone Encounter (Signed)
 Pt returned call and has been scheduled. Will mail instructions once pre op is received. Pt has [employer] so no pa needed

## 2023-10-19 NOTE — Telephone Encounter (Signed)
 [employer] to schedule EGD +/-ED

## 2023-10-21 ENCOUNTER — Ambulatory Visit (INDEPENDENT_AMBULATORY_CARE_PROVIDER_SITE_OTHER): Payer: Medicare HMO | Admitting: "Endocrinology

## 2023-10-21 ENCOUNTER — Other Ambulatory Visit: Payer: Self-pay

## 2023-10-21 ENCOUNTER — Encounter: Payer: Self-pay | Admitting: "Endocrinology

## 2023-10-21 VITALS — BP 108/72 | HR 72 | Ht 68.0 in | Wt 252.6 lb

## 2023-10-21 DIAGNOSIS — I10 Essential (primary) hypertension: Secondary | ICD-10-CM

## 2023-10-21 DIAGNOSIS — Z7985 Long-term (current) use of injectable non-insulin antidiabetic drugs: Secondary | ICD-10-CM

## 2023-10-21 DIAGNOSIS — E1165 Type 2 diabetes mellitus with hyperglycemia: Secondary | ICD-10-CM | POA: Diagnosis not present

## 2023-10-21 DIAGNOSIS — Z789 Other specified health status: Secondary | ICD-10-CM | POA: Insufficient documentation

## 2023-10-21 DIAGNOSIS — E782 Mixed hyperlipidemia: Secondary | ICD-10-CM | POA: Diagnosis not present

## 2023-10-21 MED ORDER — EMPAGLIFLOZIN 10 MG PO TABS: 10.0000 mg | ORAL_TABLET | Freq: Every day | ORAL | 1 refills | Status: DC

## 2023-10-21 MED ORDER — TIRZEPATIDE 12.5 MG/0.5ML ~~LOC~~ SOAJ: 12.5000 mg | SUBCUTANEOUS | 1 refills | Status: DC

## 2023-10-21 MED ORDER — METFORMIN HCL ER 500 MG PO TB24: 1000.0000 mg | ORAL_TABLET | Freq: Every day | ORAL | 1 refills | Status: DC

## 2023-10-21 NOTE — Progress Notes (Signed)
 10/21/2023, 11:42 AM                Endocrinology follow-up note  Subjective:    Patient ID: Tina Sampson, female    DOB: 05-11-1961.  Tina Sampson is being seen in follow-up in the management of currently uncontrolled symptomatic type 2 diabetes, hyperlipidemia, hypertension. PMD:  Bernadene Person, NP.   Past Medical History:  Diagnosis Date   Abdominal pain 10/15/2017   Diabetes mellitus    Diabetes mellitus, type II (HCC)    H/O knee surgery    Hyperlipidemia    IBS (irritable bowel syndrome)    Past Surgical History:  Procedure Laterality Date   ANKLE SURGERY Left    CARDIAC CATHETERIZATION     catherization     CERVICAL DISC SURGERY     CESAREAN SECTION     CHOLECYSTECTOMY     COLONOSCOPY WITH PROPOFOL N/A 11/20/2021   Procedure: COLONOSCOPY WITH PROPOFOL;  Surgeon: Dolores Frame, MD;  Location: [facility];  Service: Gastroenterology;  Laterality: N/A;  100   KNEE SURGERY     KNEE SURGERY     POLYPECTOMY  11/20/2021   Procedure: POLYPECTOMY;  Surgeon: Marguerita Merles, Reuel Boom, MD;  Location: [facility];  Service: Gastroenterology;;   TUBAL LIGATION     Social History   Socioeconomic History   Marital status: Married    Spouse name: Not on file   Number of children: Not on file   Years of education: Not on file   Highest education level: Not on file  Occupational History   Not on file  Tobacco Use   Smoking status: Never    Passive exposure: Current   Smokeless tobacco: Never  Vaping Use   Vaping status: Never Used  Substance and Sexual Activity   Alcohol use: No   Drug use: No   Sexual activity: Yes  Other Topics Concern   Not on file  Social History Narrative   Not on file   Social Drivers of Health   Financial Resource Strain: Low Risk  (07/11/2023)   Received from [facility]   Overall Financial Resource Strain (CARDIA)    Difficulty of Paying  Living Expenses: Not hard at all  Food Insecurity: No Food Insecurity (07/11/2023)   Received from [facility]   Hunger Vital Sign    Worried About Running Out of Food in the Last Year: Never true    Ran Out of Food in the Last Year: Never true  Transportation Needs: No Transportation Needs (07/11/2023)   Received from [facility] - Transportation    Lack of Transportation (Medical): No    Lack of Transportation (Non-Medical): No  Physical Activity: Unknown (07/11/2023)   Received from [facility]   Exercise Vital Sign    Days of Exercise per Week: 0 days    Minutes of Exercise per Session: Not on file  Stress: No Stress Concern Present (07/11/2023)   Received from [facility] - Occupational Stress Questionnaire    Feeling of Stress : Not at all  Social Connections: Socially Integrated (07/11/2023)   Received from [facility]   Social Network    How would  you rate your social network (family, work, friends)?: Good participation with social networks   Outpatient Encounter Medications as of 10/21/2023  Medication Sig   pravastatin (PRAVACHOL) 20 MG tablet Take 20 mg by mouth at bedtime.   tirzepatide (MOUNJARO) 12.5 MG/0.5ML Pen Inject 12.5 mg into the skin once a week.   ACCU-CHEK GUIDE test strip TEST BLOOD SUGAR THREE TIMES DAILY   Accu-Chek Softclix Lancets lancets TEST BLOOD SUGAR THREE TIMES DAILY AS DIRECTED   albuterol (VENTOLIN HFA) 108 (90 Base) MCG/ACT inhaler Inhale 1-2 puffs into the lungs every 4 (four) hours as needed for wheezing or shortness of breath.   Alcohol Swabs (DROPSAFE ALCOHOL PREP) 70 % PADS USE BEFORE TESTING GLUCOSE AND INJECTING BYDUREON   ascorbic acid (VITAMIN C) 500 MG tablet Take 500 mg by mouth daily.   aspirin EC 81 MG tablet Take 81 mg daily by mouth.   B Complex Vitamins (VITAMIN B COMPLEX PO) Take 1 tablet by mouth daily.   Blood Glucose Monitoring Suppl (ACCU-CHEK GUIDE) w/Device KIT Use  to test BG tid. DX e11.65   cholecalciferol (VITAMIN D3) 25 MCG (1000 UNIT) tablet Take 1,000 Units by mouth daily.   diphenhydramine-acetaminophen (TYLENOL PM) 25-500 MG TABS tablet Take 1 tablet by mouth at bedtime as needed (sleep/pain).   empagliflozin (JARDIANCE) 10 MG TABS tablet Take 1 tablet (10 mg total) by mouth daily.   EPINEPHrine 0.3 mg/0.3 mL IJ SOAJ injection Inject 0.3 mg into the muscle as needed for anaphylaxis.   glucose blood test strip Use to test blood sugar three times a day   hydrOXYzine (ATARAX) 25 MG tablet Take 1 tablet (25 mg total) by mouth 3 (three) times daily as needed for itching   hyoscyamine (LEVSIN SL) 0.125 MG SL tablet Place 1 tablet (0.125 mg total) under the tongue every 6 (six) hours as needed.   metFORMIN (GLUCOPHAGE-XR) 500 MG 24 hr tablet Take 2 tablets (1,000 mg total) by mouth daily with breakfast.   Multiple Vitamin (MULTIVITAMIN) tablet Take 1 tablet daily by mouth.   pantoprazole (PROTONIX) 40 MG tablet Take 1 tablet (40 mg total) by mouth daily.   pramipexole (MIRAPEX) 0.5 MG tablet Take 0.5 mg by mouth 3 (three) times daily. One tid For RLS   propranolol (INDERAL) 10 MG tablet Take 10 mg by mouth 2 (two) times daily.   traZODone (DESYREL) 50 MG tablet Take 50 mg by mouth at bedtime.   venlafaxine XR (EFFEXOR-XR) 150 MG 24 hr capsule Take 1 capsule (150 mg total) by mouth in the morning.   [DISCONTINUED] JARDIANCE 25 MG TABS tablet TAKE ONE TABLET DAILY   [DISCONTINUED] metFORMIN (GLUCOPHAGE-XR) 500 MG 24 hr tablet TAKE ONE TABLET DAILY WITH BREAKFAST (Patient taking differently: Take 1,000 mg by mouth daily with breakfast.)   [DISCONTINUED] tirzepatide (MOUNJARO) 10 MG/0.5ML Pen Inject 10 mg into the skin once a week.   No facility-administered encounter medications on file as of 10/21/2023.    ALLERGIES: Allergies  Allergen Reactions   Bee Venom Shortness Of Breath, Itching and Swelling    VACCINATION STATUS: Immunization History   Administered Date(s) Administered   Influenza Split 05/07/2013   Moderna Sars-Covid-2 Vaccination 10/05/2019, 11/02/2019, 08/28/2020    Diabetes She presents for her follow-up diabetic visit. She has type 2 diabetes mellitus. Onset time: She was diagnosed at approximate age of 48 years. Her disease course has been improving. There are no hypoglycemic associated symptoms. Pertinent negatives for hypoglycemia include no confusion, headaches, pallor or seizures. Pertinent  negatives for diabetes include no blurred vision, no chest pain, no polydipsia, no polyphagia, no polyuria and no weight loss. There are no hypoglycemic complications. Symptoms are improving. There are no diabetic complications. Risk factors for coronary artery disease include dyslipidemia, diabetes mellitus, obesity, sedentary lifestyle and post-menopausal. Her weight is decreasing steadily. She is following a generally unhealthy diet. When asked about meal planning, she reported none. She has not had a previous visit with a dietitian. She never participates in exercise. Her home blood glucose trend is fluctuating minimally. Her breakfast blood glucose range is generally >200 mg/dl. Her bedtime blood glucose range is generally >200 mg/dl. Her overall blood glucose range is >200 mg/dl. (She reports recent exposure to intra-articular steroid injections.  Her average blood glucose is 251.  Her point-of-care A1c 7.9% improving from 8.3%.   She does not report hypoglycemia.    ) An ACE inhibitor/angiotensin II receptor blocker is being taken. She does not see a podiatrist.Eye exam is current (She has not seen her ophthalmologist in 2 years, I urged to reschedule a visit.).  Hyperlipidemia This is a chronic problem. The current episode started more than 1 year ago. The problem is controlled. Exacerbating diseases include diabetes and obesity. Pertinent negatives include no chest pain, myalgias or shortness of breath. Current antihyperlipidemic  treatment includes statins. Risk factors for coronary artery disease include diabetes mellitus, dyslipidemia, obesity, a sedentary lifestyle and post-menopausal.     Review of systems  Constitutional: + Progressive weight loss,  current  Body mass index is 38.41 kg/m. , no fatigue, no subjective hyperthermia, no subjective hypothermia   Objective:    BP 108/72   Pulse 72   Ht 5\' 8"  (1.727 m)   Wt 252 lb 9.6 oz (114.6 kg)   LMP 03/19/2011   BMI 38.41 kg/m   Wt Readings from Last 3 Encounters:  10/21/23 252 lb 9.6 oz (114.6 kg)  10/15/23 251 lb 12.8 oz (114.2 kg)  07/23/23 263 lb 3.2 oz (119.4 kg)    Physical Exam- Limited  Constitutional:  Body mass index is 38.41 kg/m. , not in acute distress, normal state of mind   No results found for this or any previous visit (from the past 2160 hours).    Lipid Panel     Component Value Date/Time   CHOL 205 (H) 07/15/2023 1033   TRIG 250 (H) 07/15/2023 1033   HDL 44 07/15/2023 1033   CHOLHDL 4.7 (H) 07/15/2023 1033   CHOLHDL 3.7 01/13/2020 1112   LDLCALC 117 (H) 07/15/2023 1033   LDLCALC 71 01/13/2020 1112    Assessment & Plan:   1. Uncontrolled type 2 diabetes mellitus with hyperglycemia (HCC)  - Tina Sampson has currently uncontrolled symptomatic type 2 DM since  63 years of age.  She reports recent exposure to intra-articular steroid injections.  Her average blood glucose is 251.  Her point-of-care A1c 7.9% improving from 8.3%.   She does not report hypoglycemia.    Recent labs reviewed.  -her diabetes is complicated by obesity/sedentary life and Tina Sampson remains at a high risk for more acute and chronic complications which include CAD, CVA, CKD, retinopathy, and neuropathy. These are all discussed in detail with the patient.  - I have counseled her on diet management and weight loss, by adopting a carbohydrate restricted/protein rich diet.  She has disengaged from lifestyle medicine, gaining  weight.  - she acknowledges that there is a room for improvement in her food and drink choices. -  Suggestion is made for her to avoid simple carbohydrates  from her diet including Cakes, Sweet Desserts, Ice Cream, Soda (diet and regular), Sweet Tea, Candies, Chips, Cookies, Store Bought Juices, Alcohol , Artificial Sweeteners,  Coffee Creamer, and "Sugar-free" Products, Lemonade. This will help patient to have more stable blood glucose profile and potentially avoid unintended weight gain.  The following Lifestyle Medicine recommendations according to American College of Lifestyle Medicine  [facility]) were discussed and and offered to patient and she  agrees to start the journey:  A. Whole Foods, Plant-Based Nutrition comprising of fruits and vegetables, plant-based proteins, whole-grain carbohydrates was discussed in detail with the patient.   A list for source of those nutrients were also provided to the patient.  Patient will use only water or unsweetened tea for hydration. B.  The need to stay away from risky substances including alcohol, smoking; obtaining 7 to 9 hours of restorative sleep, at least 150 minutes of moderate intensity exercise weekly, the importance of healthy social connections,  and stress management techniques were discussed. C.  A full color page of  Calorie density of various food groups per pound showing examples of each food groups was provided to the patient.   - I encouraged her to switch to  unprocessed or minimally processed complex starch and increased protein intake (animal or plant source), fruits, and vegetables.   - I have approached her with the following individualized plan to manage diabetes and patient agrees:   -In light of her presentation with significantly above target glycemic profile, she will need a higher dose of Mounjaro.  I discussed and increased her Mounjaro to 12.5 mg subcutaneously weekly.  Side effects and precautions discussed with her.    She is  reporting significant side effects from Jardiance 25 mg.  I discussed and lowered her Jardiance to 10 mg p.o. daily at breakfast.  She is advised to continue metformin 1000 mg p.o. daily at breakfast.    She is willing and advised to continue monitoring blood glucose twice a day-daily before breakfast and at bedtime and encouraged to call clinic for hypoglycemia under 70 or hyperglycemia above 200 mg per DL.    2) BP/HTN Her blood pressure is controlled to target.  She remains off of lisinopril.  Her blood pressure today is 108/72. Marland Kitchen      3) Lipids/HPL:   Her most recent lipid panel showed worsening LDL to 117 from 65.  She reports statin intolerance.  She is advised to lower the dose of her pravastatin to 20 mg p.o. nightly and continue to assess.   Side effects and precautions discussed with her.     4) weight management: Her current BMI is 38.41-  This clearly interferes in the attempt to control her diabetes.   She is a candidate for modest weight loss, not engaged with lifestyle medicine nutrition.  She may benefit from continuous adjustment in the dose of her Mounjaro.   5) Chronic Care/Health Maintenance:  -she  is on  Statin medications and   lisinopril is encouraged to continue to follow up with Ophthalmology, Dentist,  Podiatrist at least yearly or according to recommendations, and advised to  stay away from smoking. I have recommended yearly flu vaccine and pneumonia vaccination at least every 5 years; moderate intensity exercise for up to 150 minutes weekly; and  sleep for at least 7 hours a day.  Her screen ABI was abnormal in March 2022.  She was evaluated by vascular surgery with  better testing showing no evidence of occlusive peripheral arterial disease.   - I advised patient to maintain close follow up with Bernadene Person, NP for primary care needs.  I spent  41  minutes in the care of the patient today including review of labs from CMP, Lipids, Thyroid Function,  Hematology (current and previous including abstractions from other facilities); face-to-face time discussing  her blood glucose readings/logs, discussing hypoglycemia and hyperglycemia episodes and symptoms, medications doses, her options of short and long term treatment based on the latest standards of care / guidelines;  discussion about incorporating lifestyle medicine;  and documenting the encounter. Risk reduction counseling performed per USPSTF guidelines to reduce  obesity and cardiovascular risk factors.     Please refer to Patient Instructions for Blood Glucose Monitoring and Insulin/Medications Dosing Guide"  in media tab for additional information. Please  also refer to " Patient Self Inventory" in the Media  tab for reviewed elements of pertinent patient history.  Governor Specking participated in the discussions, expressed understanding, and voiced agreement with the above plans.  All questions were answered to her satisfaction. she is encouraged to contact clinic should she have any questions or concerns prior to her return visit.    Follow up plan: - Return in about 4 months (around 02/20/2024) for F/U with Pre-visit Labs, Meter/CGM/Logs, A1c here.  Marquis Lunch, MD [facility] [address] [place], [place] [address] Phone: [phone]  Fax: [phone]    10/21/2023, 11:42 AM  This note was partially dictated with voice recognition software. Similar sounding words can be transcribed inadequately or may not  be corrected upon review.

## 2023-10-21 NOTE — Patient Instructions (Signed)

## 2023-10-27 ENCOUNTER — Other Ambulatory Visit: Payer: Self-pay | Admitting: "Endocrinology

## 2023-10-27 ENCOUNTER — Other Ambulatory Visit: Payer: Self-pay | Admitting: Nurse Practitioner

## 2023-10-28 ENCOUNTER — Telehealth: Payer: Self-pay | Admitting: Pharmacy Technician

## 2023-10-28 ENCOUNTER — Other Ambulatory Visit (HOSPITAL_COMMUNITY): Payer: Self-pay

## 2023-10-28 NOTE — Telephone Encounter (Signed)
 Pharmacy Patient Advocate Encounter  Received notification from [facility] [employer] that Prior Authorization for [facility] 12.5mg   has been APPROVED from 10/28/2023 to 10/27/2024. Ran test claim, Copay is $0.00. This test claim was processed through [facility]- copay amounts may vary at other pharmacies due to pharmacy/plan contracts, or as the patient moves through the different stages of their insurance plan.   PA #/Case ID/Reference #: [id number]  Key: Tina Sampson

## 2023-10-30 ENCOUNTER — Other Ambulatory Visit: Payer: Self-pay

## 2023-10-30 ENCOUNTER — Other Ambulatory Visit (HOSPITAL_COMMUNITY): Payer: Self-pay

## 2023-10-30 DIAGNOSIS — E1165 Type 2 diabetes mellitus with hyperglycemia: Secondary | ICD-10-CM

## 2023-10-30 MED ORDER — EMPAGLIFLOZIN 10 MG PO TABS: 10.0000 mg | ORAL_TABLET | Freq: Every day | ORAL | 1 refills | Status: DC

## 2023-10-31 ENCOUNTER — Other Ambulatory Visit (HOSPITAL_COMMUNITY): Payer: Self-pay

## 2023-10-31 ENCOUNTER — Other Ambulatory Visit (HOSPITAL_BASED_OUTPATIENT_CLINIC_OR_DEPARTMENT_OTHER): Payer: Self-pay

## 2023-11-02 ENCOUNTER — Other Ambulatory Visit: Payer: Self-pay

## 2023-11-02 ENCOUNTER — Other Ambulatory Visit (HOSPITAL_COMMUNITY): Payer: Self-pay

## 2023-11-02 NOTE — Patient Instructions (Signed)
 Tina Sampson  11/02/2023     @PREFPERIOPPHARMACY @   Your procedure is scheduled on  11/10/2023.   Report to [facility] at  0745  A.M.   Call this number if you have problems the morning of surgery:  [phone]  If you experience any cold or flu symptoms such as cough, fever, chills, shortness of breath, etc. between now and your scheduled surgery, please notify us at the above number.   Remember:        Your last dose of jardiance should be on 11/06/2023.         Your last dose of mounjaro should be on 11/02/2023.   Follow the diet instructions given to you by the office.        Use your inhaler before you come and bring your rescue inhaler with you.   You may drink clear liquids until  0545 am on 11/10/2023.    Clear liquids allowed are:                    Water, Juice (No red color; non-citric and without pulp; diabetics please choose diet or no sugar options), Carbonated beverages (diabetics please choose diet or no sugar options), Clear Tea (No creamer, milk, or cream, including half & half and powdered creamer), Black Coffee Only (No creamer, milk or cream, including half & half and powdered creamer), and Clear Sports drink (No red color; diabetics please choose diet or no sugar options)    Take these medicines the morning of surgery with A SIP OF WATER       hydroxyzine, pantoprazole, pramipexole, propranolol, venlafaxine.     Do not wear jewelry, make-up or nail polish, including gel polish,  artificial nails, or any other type of covering on natural nails (fingers and  toes).  Do not wear lotions, powders, or perfumes, or deodorant.  Do not shave 48 hours prior to surgery.  Men may shave face and neck.  Do not bring valuables to the hospital.  [facility] is not responsible for any belongings or valuables.  Contacts, dentures or bridgework may not be worn into surgery.  Leave your suitcase in the car.  After surgery it may be brought to your room.  For  patients admitted to the hospital, discharge time will be determined by your treatment team.  Patients discharged the day of surgery will not be allowed to drive home and must have someone with them for 24 hours.    Special instructions:   DO NOT smoke tobacco or vape for 24 hours before your procedure.  Please read over the following fact sheets that you were given. Anesthesia Post-op Instructions and Care and Recovery After Surgery      Upper Endoscopy, Adult, Care After After the procedure, it is common to have a sore throat. It is also common to have: Mild stomach pain or discomfort. Bloating. Nausea. Follow these instructions at home: The instructions below may help you care for yourself at home. Your health care provider may give you more instructions. If you have questions, ask your health care provider. If you were given a sedative during the procedure, it can affect you for several hours. Do not drive or operate machinery until your health care provider says that it is safe. If you will be going home right after the procedure, plan to have a responsible adult: Take you home from the hospital or clinic. You will not be allowed to  drive. Care for you for the time you are told. Follow instructions from your health care provider about what you may eat and drink. Return to your normal activities as told by your health care provider. Ask your health care provider what activities are safe for you. Take over-the-counter and prescription medicines only as told by your health care provider. Contact a health care provider if you: Have a sore throat that lasts longer than one day. Have trouble swallowing. Have a fever. Get help right away if you: Vomit blood or your vomit looks like coffee grounds. Have bloody, black, or tarry stools. Have a very bad sore throat or you cannot swallow. Have difficulty breathing or very bad pain in your chest or abdomen. These symptoms may be an  emergency. Get help right away. Call 911. Do not wait to see if the symptoms will go away. Do not drive yourself to the hospital. Summary After the procedure, it is common to have a sore throat, mild stomach discomfort, bloating, and nausea. If you were given a sedative during the procedure, it can affect you for several hours. Do not drive until your health care provider says that it is safe. Follow instructions from your health care provider about what you may eat and drink. Return to your normal activities as told by your health care provider. This information is not intended to replace advice given to you by your health care provider. Make sure you discuss any questions you have with your health care provider. Document Revised: 10/30/2021 Document Reviewed: 10/30/2021 Elsevier Patient Education  2024 [employer]Esophageal Dilatation Esophageal dilatation, or dilation, is done to stretch a blocked or narrowed part of your esophagus. The esophagus is the part of your body that moves food from your mouth to your stomach. You may need to have it stretched if: You have a lot of scar tissue and it makes it hard or painful to swallow. You have cancer of the esophagus. There's a problem with how food moves through your esophagus. In some cases, you may need to have this procedure done more than once. Tell a health care provider about: Any allergies you have. All medicines you're taking, including vitamins, herbs, eye drops, creams, and over-the-counter medicines. Any problems you or family members have had with anesthesia. Any bleeding problems you have. Any surgeries you've had. Any medical conditions you have. Whether you're pregnant or may be pregnant. What are the risks? Your health care provider will talk with you about risks. These may include: Bleeding. A hole or tear in your esophagus. What happens before the procedure? When to stop eating and drinking Follow instructions from  your provider about what you may eat and drink. These may include: 8 hours before your procedure Stop eating most foods. Do not eat meat, fried foods, or fatty foods. Eat only light foods, such as toast or crackers. All liquids are okay except energy drinks and alcohol. 6 hours before your procedure Stop eating. Drink only clear liquids, such as water, clear fruit juice, black coffee, plain tea, and sports drinks. Do not drink energy drinks or alcohol. 2 hours before your procedure Stop drinking all liquids. You may be allowed to take medicines with small sips of water. If you don't follow your provider's instructions, your procedure may be delayed or canceled. Medicines Ask your provider about: Changing or stopping your regular medicines. These include any diabetes medicines or blood thinners you take. Taking medicines such as aspirin and ibuprofen. These medicines can thin  your blood. Do not take them unless your provider tells you to. Taking over-the-counter medicines, vitamins, herbs, and supplements. General instructions If you'll be going home right after the procedure, plan to have a responsible adult: Take you home from the hospital or clinic. You won't be allowed to drive. Care for you for the time you're told. What happens during the procedure? You may be given: A sedative. This helps you relax. Anesthesia. This keeps you from feeling pain. It will numb certain areas of your body. The stretching may be done with: Simple dilators. These are tools put in your esophagus to stretch it. Guide wires. These wires are put in using a tube called an endoscope. A dilator is put over the wires to stretch your esophagus. Then the wires are taken out. A balloon. The balloon is on the end of a tube. It's inflated to stretch your esophagus. The procedure may vary among providers and hospitals. What can I expect after the procedure? Your blood pressure, heart rate, breathing rate, and blood  oxygen level will be monitored until you leave the hospital or clinic. Your throat may feel sore and numb. This will get better over time. You won't be allowed to eat or drink until your throat is no longer numb. You may be able to go home when you can: Drink. Pee. Sit on the edge of the bed without nausea or dizziness. Follow these instructions at home: Activity If you were given a sedative during the procedure, it can affect you for several hours. Do not drive or operate machinery until your provider says it's safe. Return to your normal activities as told by your provider. Ask your provider what activities are safe for you. General instructions Take over-the-counter and prescription medicines only as told by your provider. Follow instructions from your provider about what you may eat and drink. Do not use any products that contain nicotine or tobacco. These products include cigarettes, chewing tobacco, and vaping devices, such as e-cigarettes. If you need help quitting, ask your provider. Keep all follow-up visits. Your provider will make sure the procedure worked. Where to find more information American Society for Gastrointestinal Endoscopy (ASGE): asge.org Contact a health care provider if: You have trouble swallowing. You have a fever. Your pain doesn't get better with medicine. Get help right away if: You have chest pain. You have trouble breathing. You vomit blood. Your poop is: Black. Tarry. Bloody. These symptoms may be an emergency. Get help right away. Call 911. Do not wait to see if the symptoms will go away. Do not drive yourself to the hospital. This information is not intended to replace advice given to you by your health care provider. Make sure you discuss any questions you have with your health care provider. Document Revised: 10/17/2022 Document Reviewed: 10/17/2022 Elsevier Patient Education  2024 Elsevier Inc.General Anesthesia, Adult, Care After The  following information offers guidance on how to care for yourself after your procedure. Your health care provider may also give you more specific instructions. If you have problems or questions, contact your health care provider. What can I expect after the procedure? After the procedure, it is common for people to: Have pain or discomfort at the IV site. Have nausea or vomiting. Have a sore throat or hoarseness. Have trouble concentrating. Feel cold or chills. Feel weak, sleepy, or tired (fatigue). Have soreness and body aches. These can affect parts of the body that were not involved in surgery. Follow these instructions at home: For  the time period you were told by your health care provider:  Rest. Do not participate in activities where you could fall or become injured. Do not drive or use machinery. Do not drink alcohol. Do not take sleeping pills or medicines that cause drowsiness. Do not make important decisions or sign legal documents. Do not take care of children on your own. General instructions Drink enough fluid to keep your urine pale yellow. If you have sleep apnea, surgery and certain medicines can increase your risk for breathing problems. Follow instructions from your health care provider about wearing your sleep device: Anytime you are sleeping, including during daytime naps. While taking prescription pain medicines, sleeping medicines, or medicines that make you drowsy. Return to your normal activities as told by your health care provider. Ask your health care provider what activities are safe for you. Take over-the-counter and prescription medicines only as told by your health care provider. Do not use any products that contain nicotine or tobacco. These products include cigarettes, chewing tobacco, and vaping devices, such as e-cigarettes. These can delay incision healing after surgery. If you need help quitting, ask your health care provider. Contact a health care  provider if: You have nausea or vomiting that does not get better with medicine. You vomit every time you eat or drink. You have pain that does not get better with medicine. You cannot urinate or have bloody urine. You develop a skin rash. You have a fever. Get help right away if: You have trouble breathing. You have chest pain. You vomit blood. These symptoms may be an emergency. Get help right away. Call 911. Do not wait to see if the symptoms will go away. Do not drive yourself to the hospital. Summary After the procedure, it is common to have a sore throat, hoarseness, nausea, vomiting, or to feel weak, sleepy, or fatigue. For the time period you were told by your health care provider, do not drive or use machinery. Get help right away if you have difficulty breathing, have chest pain, or vomit blood. These symptoms may be an emergency. This information is not intended to replace advice given to you by your health care provider. Make sure you discuss any questions you have with your health care provider. Document Revised: 10/18/2021 Document Reviewed: 10/18/2021 Elsevier Patient Education  2024 [employer].

## 2023-11-03 ENCOUNTER — Encounter (HOSPITAL_COMMUNITY)
Admission: RE | Admit: 2023-11-03 | Discharge: 2023-11-03 | Disposition: A | Discharge status: Home / Self Care | Source: Ambulatory Visit | Attending: Gastroenterology | Admitting: Gastroenterology

## 2023-11-03 ENCOUNTER — Other Ambulatory Visit: Payer: Self-pay

## 2023-11-03 VITALS — BP 108/72 | HR 72 | Temp 97.5°F | Resp 18 | Ht 68.0 in | Wt 252.6 lb

## 2023-11-03 DIAGNOSIS — I10 Essential (primary) hypertension: Secondary | ICD-10-CM | POA: Insufficient documentation

## 2023-11-03 DIAGNOSIS — Z6838 Body mass index (BMI) 38.0-38.9, adult: Secondary | ICD-10-CM | POA: Diagnosis not present

## 2023-11-03 DIAGNOSIS — Z01818 Encounter for other preprocedural examination: Secondary | ICD-10-CM | POA: Insufficient documentation

## 2023-11-03 DIAGNOSIS — E1165 Type 2 diabetes mellitus with hyperglycemia: Secondary | ICD-10-CM | POA: Insufficient documentation

## 2023-11-03 LAB — BASIC METABOLIC PANEL WITH GFR
Anion gap: 9 (ref 5–15)
BUN: 9 mg/dL (ref 8–23)
CO2: 23 mmol/L (ref 22–32)
Calcium: 9.4 mg/dL (ref 8.9–10.3)
Chloride: 107 mmol/L (ref 98–111)
Creatinine, Ser: 0.72 mg/dL (ref 0.44–1.00)
GFR, Estimated: >60 mL/min (ref >60)
Glucose, Bld: 187 mg/dL — ABNORMAL HIGH (ref 70–99)
Potassium: 4.3 mmol/L (ref 3.5–5.1)
Sodium: 139 mmol/L (ref 135–145)

## 2023-11-04 ENCOUNTER — Other Ambulatory Visit: Payer: Self-pay

## 2023-11-04 DIAGNOSIS — E782 Mixed hyperlipidemia: Secondary | ICD-10-CM

## 2023-11-04 MED ORDER — PRAVASTATIN SODIUM 20 MG PO TABS
20.0000 mg | ORAL_TABLET | Freq: Every day | ORAL | 1 refills | Status: DC
Start: 2023-11-04 — End: 2024-04-14

## 2023-11-05 ENCOUNTER — Other Ambulatory Visit (HOSPITAL_COMMUNITY): Payer: Self-pay

## 2023-11-06 ENCOUNTER — Other Ambulatory Visit (HOSPITAL_COMMUNITY): Payer: Self-pay

## 2023-11-09 ENCOUNTER — Ambulatory Visit: Admitting: Physical Therapy

## 2023-11-10 ENCOUNTER — Encounter (HOSPITAL_COMMUNITY): Payer: Self-pay | Admitting: Gastroenterology

## 2023-11-10 ENCOUNTER — Ambulatory Visit (HOSPITAL_COMMUNITY)
Admission: RE | Admit: 2023-11-10 | Discharge: 2023-11-10 | Disposition: A | Discharge status: Home / Self Care | Attending: Gastroenterology | Admitting: Gastroenterology

## 2023-11-10 ENCOUNTER — Encounter (HOSPITAL_COMMUNITY)
Admission: RE | Disposition: A | Discharge status: Home / Self Care | Payer: Self-pay | Source: Home / Self Care | Attending: Gastroenterology

## 2023-11-10 ENCOUNTER — Ambulatory Visit (HOSPITAL_BASED_OUTPATIENT_CLINIC_OR_DEPARTMENT_OTHER): Admitting: Certified Registered"

## 2023-11-10 ENCOUNTER — Ambulatory Visit (HOSPITAL_COMMUNITY): Admitting: Certified Registered"

## 2023-11-10 DIAGNOSIS — I10 Essential (primary) hypertension: Secondary | ICD-10-CM | POA: Insufficient documentation

## 2023-11-10 DIAGNOSIS — E119 Type 2 diabetes mellitus without complications: Secondary | ICD-10-CM | POA: Diagnosis not present

## 2023-11-10 DIAGNOSIS — G8929 Other chronic pain: Secondary | ICD-10-CM | POA: Diagnosis not present

## 2023-11-10 DIAGNOSIS — Z7985 Long-term (current) use of injectable non-insulin antidiabetic drugs: Secondary | ICD-10-CM | POA: Insufficient documentation

## 2023-11-10 DIAGNOSIS — K449 Diaphragmatic hernia without obstruction or gangrene: Secondary | ICD-10-CM | POA: Diagnosis not present

## 2023-11-10 DIAGNOSIS — E785 Hyperlipidemia, unspecified: Secondary | ICD-10-CM | POA: Diagnosis not present

## 2023-11-10 DIAGNOSIS — K589 Irritable bowel syndrome without diarrhea: Secondary | ICD-10-CM | POA: Insufficient documentation

## 2023-11-10 DIAGNOSIS — R1310 Dysphagia, unspecified: Secondary | ICD-10-CM

## 2023-11-10 DIAGNOSIS — R1030 Lower abdominal pain, unspecified: Secondary | ICD-10-CM | POA: Diagnosis present

## 2023-11-10 DIAGNOSIS — Z7984 Long term (current) use of oral hypoglycemic drugs: Secondary | ICD-10-CM | POA: Diagnosis not present

## 2023-11-10 DIAGNOSIS — K219 Gastro-esophageal reflux disease without esophagitis: Secondary | ICD-10-CM | POA: Diagnosis not present

## 2023-11-10 DIAGNOSIS — Z79899 Other long term (current) drug therapy: Secondary | ICD-10-CM | POA: Insufficient documentation

## 2023-11-10 HISTORY — PX: ESOPHAGEAL DILATION: SHX303

## 2023-11-10 HISTORY — PX: ESOPHAGOGASTRODUODENOSCOPY: SHX5428

## 2023-11-10 LAB — GLUCOSE, CAPILLARY: Glucose-Capillary: 200 mg/dL — ABNORMAL HIGH (ref 70–99)

## 2023-11-10 SURGERY — EGD (ESOPHAGOGASTRODUODENOSCOPY)
Anesthesia: General

## 2023-11-10 MED ORDER — STERILE WATER FOR IRRIGATION IR SOLN
Status: DC | PRN
  Administered 2023-11-10: 80 mL

## 2023-11-10 MED ORDER — LACTATED RINGERS IV SOLN: INTRAVENOUS | Status: DC | PRN

## 2023-11-10 MED ORDER — PROPOFOL 10 MG/ML IV BOLUS
INTRAVENOUS | Status: DC | PRN
  Administered 2023-11-10: 100 mg via INTRAVENOUS
  Administered 2023-11-10: 50 mg via INTRAVENOUS

## 2023-11-10 MED ORDER — LACTATED RINGERS IV SOLN: INTRAVENOUS | Status: DC

## 2023-11-10 MED ORDER — PROPOFOL 500 MG/50ML IV EMUL
INTRAVENOUS | Status: DC | PRN
  Administered 2023-11-10: 150 ug/kg/min via INTRAVENOUS

## 2023-11-10 MED ORDER — LIDOCAINE HCL (PF) 2 % IJ SOLN
INTRAMUSCULAR | Status: DC | PRN
  Administered 2023-11-10: 100 mg via INTRADERMAL

## 2023-11-10 NOTE — Discharge Instructions (Signed)
 You are being discharged to home.  Resume your previous diet.  Continue your present medications.

## 2023-11-10 NOTE — Transfer of Care (Signed)
 Immediate Anesthesia Transfer of Care Note  Patient: Tina Sampson  Procedure(s) Performed: EGD (ESOPHAGOGASTRODUODENOSCOPY) DILATION, ESOPHAGUS  Patient Location: Short Stay  Anesthesia Type:General  Level of Consciousness: awake  Airway & Oxygen Therapy: Patient Spontanous Breathing  Post-op Assessment: Report given to RN and Post -op Vital signs reviewed and stable  Post vital signs: Reviewed and stable  Last Vitals:  Vitals Value Taken Time  BP 84/62 11/10/23 0907  Temp 36.6 C 11/10/23 0907  Pulse 79 11/10/23 0907  Resp 14 11/10/23 0907  SpO2 93 % 11/10/23 0907    Last Pain:  Vitals:   11/10/23 0907  TempSrc: Oral  PainSc: 0-No pain         Complications: No notable events documented.

## 2023-11-10 NOTE — Anesthesia Procedure Notes (Signed)
 Date/Time: 11/10/2023 8:51 AM  Performed by: Julian Reil, CRNAPre-anesthesia Checklist: Patient identified, Emergency Drugs available, Suction available and Patient being monitored Patient Re-evaluated:Patient Re-evaluated prior to induction Oxygen Delivery Method: Nasal cannula Induction Type: IV induction Placement Confirmation: positive ETCO2 Comments: Optiflow High Flow [place] O2 used.

## 2023-11-10 NOTE — Anesthesia Preprocedure Evaluation (Addendum)
 Anesthesia Evaluation  Patient identified by MRN, date of birth, ID band Patient awake    Reviewed: Allergy & Precautions, H&P , NPO status , Patient's Chart, lab work & pertinent test results, reviewed documented beta blocker date and time   Airway Mallampati: II  TM Distance: >3 FB Neck ROM: full    Dental  (+) Edentulous Upper, Edentulous Lower   Pulmonary neg pulmonary ROS   Pulmonary exam normal breath sounds clear to auscultation       Cardiovascular Exercise Tolerance: Good hypertension, negative cardio ROS  Rhythm:regular Rate:Normal     Neuro/Psych negative neurological ROS  negative psych ROS   GI/Hepatic negative GI ROS, Neg liver ROS,GERD  ,,  Endo/Other  diabetes    Renal/GU negative Renal ROS  negative genitourinary   Musculoskeletal   Abdominal   Peds  Hematology negative hematology ROS (+)   Anesthesia Other Findings   Reproductive/Obstetrics negative OB ROS                             Anesthesia Physical Anesthesia Plan  ASA: 2  Anesthesia Plan: General   Post-op Pain Management:    Induction:   PONV Risk Score and Plan: Propofol infusion  Airway Management Planned:   Additional Equipment:   Intra-op Plan:   Post-operative Plan:   Informed Consent: I have reviewed the patients History and Physical, chart, labs and discussed the procedure including the risks, benefits and alternatives for the proposed anesthesia with the patient or authorized representative who has indicated his/her understanding and acceptance.     Dental Advisory Given  Plan Discussed with: CRNA  Anesthesia Plan Comments:        Anesthesia Quick Evaluation

## 2023-11-10 NOTE — Op Note (Signed)
 [facility] Patient Name: Tina Sampson Procedure Date: 11/10/2023 8:09 AM MRN: [id number] Date of Birth: [birth year] Attending MD: Katrinka Blazing , , [id number] CSN: [id number] Age: 63 Admit Type: Outpatient Procedure:                Upper GI endoscopy Indications:              Lower abdominal pain, Dysphagia Providers:                Katrinka Blazing, Angelica Ran, [place] Wilson, Tonya                            Wilson Referring MD:              Medicines:                Monitored Anesthesia Care Complications:            No immediate complications. Estimated Blood Loss:     Estimated blood loss: none. Procedure:                Pre-Anesthesia Assessment:                           - Prior to the procedure, a History and Physical                            was performed, and patient medications, allergies                            and sensitivities were reviewed. The patient's                            tolerance of previous anesthesia was reviewed.                           - The risks and benefits of the procedure and the                            sedation options and risks were discussed with the                            patient. All questions were answered and informed                            consent was obtained.                           - ASA Grade Assessment: II - A patient with mild                            systemic disease.                           After obtaining informed consent, the endoscope was                            passed under direct vision. Throughout the  procedure, the patient's blood pressure, pulse, and                            oxygen saturations were monitored continuously. The                            [id number]-[id number] ([id number]) scope was introduced through the                            mouth, and advanced to the second part of duodenum.                            The upper GI endoscopy was accomplished without                             difficulty. The patient tolerated the procedure                            well. Scope In: 8:56:05 AM Scope Out: 9:03:26 AM Total Procedure Duration: 0 hours 7 minutes 21 seconds  Findings:      No endoscopic abnormality was evident in the esophagus to explain the       patient's complaint of dysphagia. It was decided, however, to proceed       with dilation of the entire esophagus. A guidewire was placed and the       scope was withdrawn. Dilation was performed with a Savary dilator with       no resistance at 18 mm. The dilation site was examined following       endoscope reinsertion and showed no change.      The stomach was normal.      The examined duodenum was normal. Impression:               - No endoscopic esophageal abnormality to explain                            patient's dysphagia. Esophagus dilated. Dilated.                           - Normal stomach.                           - Normal examined duodenum.                           - No specimens collected. Moderate Sedation:      Per Anesthesia Care Recommendation:           - Discharge patient to home (ambulatory).                           - Resume previous diet.                           - Continue present medications. Procedure Code(s):        --- Professional ---  16109, Esophagogastroduodenoscopy, flexible,                            transoral; with insertion of guide wire followed by                            passage of dilator(s) through esophagus over guide                            wire Diagnosis Code(s):        --- Professional ---                           R13.10, Dysphagia, unspecified                           R10.30, Lower abdominal pain, unspecified CPT copyright 2022 American Medical Association. All rights reserved. The codes documented in this report are preliminary and upon coder review may  be revised to meet current compliance requirements. Katrinka Blazing, MD Katrinka Blazing,  11/10/2023 9:06:54 AM This report has been signed electronically. Number of Addenda: 0

## 2023-11-10 NOTE — Interval H&P Note (Signed)
 History and Physical Interval Note:  11/10/2023 8:47 AM  Tina Sampson  has presented today for surgery, with the diagnosis of GERD, DYSPHAGIA.  The various methods of treatment have been discussed with the patient and family. After consideration of risks, benefits and other options for treatment, the patient has consented to  Procedure(s) with comments: EGD (ESOPHAGOGASTRODUODENOSCOPY) (N/A) - 9:45AM;ASA 3 DILATION, ESOPHAGUS (N/A) - 9:45AM;ASA 3 as a surgical intervention.  The patient's history has been reviewed, patient examined, no change in status, stable for surgery.  I have reviewed the patient's chart and labs.  Questions were answered to the patient's satisfaction.     [name]

## 2023-11-11 ENCOUNTER — Encounter (HOSPITAL_COMMUNITY): Payer: Self-pay | Admitting: Gastroenterology

## 2023-11-11 ENCOUNTER — Other Ambulatory Visit (HOSPITAL_COMMUNITY): Payer: Self-pay

## 2023-11-11 ENCOUNTER — Other Ambulatory Visit: Payer: Self-pay

## 2023-11-11 DIAGNOSIS — E1165 Type 2 diabetes mellitus with hyperglycemia: Secondary | ICD-10-CM

## 2023-11-11 MED ORDER — METFORMIN HCL ER 500 MG PO TB24
1000.0000 mg | ORAL_TABLET | Freq: Every day | ORAL | 1 refills | Status: DC
  Filled 2023-11-11 – 2023-12-10 (×5): qty 180, 90d supply, fill #0
  Filled 2024-03-05: qty 180, 90d supply, fill #1

## 2023-11-11 NOTE — Anesthesia Postprocedure Evaluation (Signed)
 Anesthesia Post Note  Patient: Governor Specking  Procedure(s) Performed: EGD (ESOPHAGOGASTRODUODENOSCOPY) DILATION, ESOPHAGUS  Patient location during evaluation: Phase II Anesthesia Type: General Level of consciousness: awake Pain management: pain level controlled Vital Signs Assessment: post-procedure vital signs reviewed and stable Respiratory status: spontaneous breathing and respiratory function stable Cardiovascular status: blood pressure returned to baseline and stable Postop Assessment: no headache and no apparent nausea or vomiting Anesthetic complications: no Comments: Late entry   No notable events documented.   Last Vitals:  Vitals:   11/10/23 0907 11/10/23 0913  BP: (!) 84/62 (!) 94/51  Pulse: 79 84  Resp: 14   Temp: 36.6 C   SpO2: 93% 96%    Last Pain:  Vitals:   11/10/23 0907  TempSrc: Oral  PainSc: 0-No pain                 Windell [place]

## 2023-11-17 ENCOUNTER — Ambulatory Visit: Attending: Orthopaedic Surgery | Admitting: Physical Therapy

## 2023-11-17 ENCOUNTER — Other Ambulatory Visit (HOSPITAL_COMMUNITY): Payer: Self-pay

## 2023-11-17 ENCOUNTER — Encounter: Payer: Self-pay | Admitting: Physical Therapy

## 2023-11-17 DIAGNOSIS — R600 Localized edema: Secondary | ICD-10-CM | POA: Diagnosis present

## 2023-11-17 DIAGNOSIS — M25561 Pain in right knee: Secondary | ICD-10-CM | POA: Insufficient documentation

## 2023-11-17 NOTE — Therapy (Signed)
 OUTPATIENT PHYSICAL THERAPY LOWER EXTREMITY EVALUATION   Patient Name: Tina Sampson MRN: [id number] DOB:05-29-1961, 63 y.o., female Today's Date: 11/17/2023  END OF SESSION:  PT End of Session - 11/17/23 1134     Visit Number 1    Number of Visits 12    Date for PT Re-Evaluation 12/29/23    Activity Tolerance Patient tolerated treatment well    Behavior During Therapy [facility] for tasks assessed/performed             Past Medical History:  Diagnosis Date   Abdominal pain 10/15/2017   Diabetes mellitus    Diabetes mellitus, type II (HCC)    H/O knee surgery    Hyperlipidemia    IBS (irritable bowel syndrome)    Past Surgical History:  Procedure Laterality Date   ANKLE SURGERY Left    CARDIAC CATHETERIZATION     catherization     CERVICAL DISC SURGERY     CESAREAN SECTION     CHOLECYSTECTOMY     COLONOSCOPY WITH PROPOFOL N/A 11/20/2021   Procedure: COLONOSCOPY WITH PROPOFOL;  Surgeon: Dolores Frame, MD;  [facility];  Service: Gastroenterology;  Laterality: N/A;  100   ESOPHAGEAL DILATION N/A 11/10/2023   Procedure: DILATION, ESOPHAGUS;  Surgeon: Dolores Frame, MD;  [facility];  Service: Gastroenterology;  Laterality: N/A;  9:45AM;ASA 3   ESOPHAGOGASTRODUODENOSCOPY N/A 11/10/2023   Procedure: EGD (ESOPHAGOGASTRODUODENOSCOPY);  Surgeon: Marguerita Merles, Reuel Boom, MD;  [facility];  Service: Gastroenterology;  Laterality: N/A;  9:45AM;ASA 3   KNEE SURGERY     KNEE SURGERY     POLYPECTOMY  11/20/2021   Procedure: POLYPECTOMY;  Surgeon: Marguerita Merles, Reuel Boom, MD;  [facility];  Service: Gastroenterology;;   TUBAL LIGATION     Patient Active Problem List   Diagnosis Date Noted   Statin intolerance 10/21/2023   Gastroesophageal reflux disease 10/15/2023   Long-term (current) use of injectable non-insulin antidiabetic drugs 03/23/2023   Irritable bowel syndrome with constipation 02/06/2022    Dysphagia 11/05/2021   Tubular adenoma of colon 11/04/2021   Abdominal pain, chronic, right lower quadrant 11/04/2021   Essential hypertension, benign 11/23/2018   Abdominal pain 10/15/2017   Uncontrolled type 2 diabetes mellitus with hyperglycemia (HCC) 06/10/2017   Mixed hyperlipidemia 06/10/2017   Morbid obesity (HCC) 06/10/2017    REFERRING PROVIDER: Elbert Ewings MD  REFERRING DIAG: Primary arthritis of right knee.  THERAPY DIAG:  Acute pain of right knee  Localized edema  Rationale for Evaluation and Treatment: Rehabilitation  ONSET DATE: 07/2023.  SUBJECTIVE:   SUBJECTIVE STATEMENT: The patient presents to the clinic with with c/o right knee pain as the result of falling on a woodpile on 07/28/23.  Her pain is rated at a 7-8/10.  She has not found anything that really decreases her pain.  She is using a brace with a a patellar orifice.  She states her knee has given out and she fell twice.  I recommended she use a cane for safety.    PERTINENT HISTORY: See above.   PAIN:  Are you having pain? Yes: NPRS scale: 7-8/10. Pain location: Right knee.   Pain description: Ache.   Aggravating factors: Increased activity, walking, standing.   Relieving factors: 'Nothing."    PRECAUTIONS: Other: No increase therex.   WEIGHT BEARING RESTRICTIONS: No  FALLS:  Has patient fallen in last 6 months? Yes. Number of falls 2.    LIVING ENVIRONMENT: Lives with: lives with their spouse  Lives in: House/apartment Stairs:  3 external steps.  Performing in a non-reciprocating fashion. Has following equipment at home: None  OCCUPATION: Disabled.  PLOF: Independent  PATIENT GOALS: Get rid of knee pain and increase stability.     OBJECTIVE:  Note: Objective measures were completed at Evaluation unless otherwise noted.  DIAGNOSTIC FINDINGS: MRI:  Medial mensicus tear, Patello-femoral athrosis with grade 4 chondromalacia, moderate medial compartment chondrosis   PATIENT  SURVEYS:  LEFS 41/80.    EDEMA:  Circumferential: right 2 cms > left.  PALPATION: Pain reported in right medial popliteal fossa region.    LOWER EXTREMITY ROM:  WNL for right knee AROM.    LOWER EXTREMITY MMT: Patient performing a SLR without extensor lag and able to provide a normal strength grade for right knee extension.    LOWER EXTREMITY SPECIAL TESTS:  Normal right knee patellar mobility.    GAIT: Antalgic gait with some decrease in stance time over right LE.                                                                 TREATMENT DATE: 11/17/23:   Vasopneumatic on low with IFC at 1-10 Hz on 40% scan x  20 minutes to patient's right knee.  Normal modality response following removal of modality.    PATIENT EDUCATION:  Education details: Disussed patient using a cane for fall prevention.  Discussed exercise progression and avoidance of activities that increase her pain. Person educated: Patient Education method: Explanation Education comprehension: verbalized understanding  HOME EXERCISE PROGRAM:    ASSESSMENT:  CLINICAL IMPRESSION: The patient presents to [facility] with c/o right knee pain due to a fall on 07/27/24.  She is wearing a brace with a patellar orifice.  She reports 2 falls when her knee gave way.  Encouraged her to use a cane which she agreed to.  Her CC today was pain in the right medial popliteal fossa region.  She exhibits full active right knee range of motion and provided a normal right quadriceps strength grade.  Her gait is antalgic in nature and she decreases her stance time over her right LE during the gait cycle.  Her LEFS score is 41/80.  Patient will benefit from skilled physical therapy intervention to address pain and deficits.  OBJECTIVE IMPAIRMENTS: Abnormal gait, difficulty walking, increased edema, and pain.   ACTIVITY LIMITATIONS: carrying, lifting, standing, stairs, and locomotion level  PARTICIPATION LIMITATIONS: meal prep, cleaning,  laundry, and yard work  [facility] POTENTIAL: Good  CLINICAL DECISION MAKING: Evolving/moderate complexity  EVALUATION COMPLEXITY: Low   GOALS:  LONG TERM GOALS: Target date: 12/29/23.  Ind with a HEP.  Goal status: INITIAL  2.  Perform ADL's with right knee pain not > 3/10.  Goal status: INITIAL  3.  No complaints of right knee giving way.  Goal status: INITIAL  4.  Walk a community distance with pain not > 3/10.  Goal status: INITIAL   PLAN:  PT FREQUENCY: 2x/week  PT DURATION: 6 weeks  PLANNED INTERVENTIONS: 97110-Therapeutic exercises, 97530- Therapeutic activity, [id number]- Neuromuscular re-education, 97535- Self Care, 16109- Manual therapy, [id number]- Electrical stimulation (unattended), 97016- Vasopneumatic device, 97035- Ultrasound, Patient/Family education, Taping, Cryotherapy, and Moist heat  PLAN FOR NEXT SESSION: Nustep, recumbent bike, no pain increase right knee therex (  O anCKC), modalities as needed.     Avianna Moynahan, [place], PT 11/17/2023, 11:35 AM

## 2023-11-18 ENCOUNTER — Other Ambulatory Visit: Payer: Self-pay

## 2023-11-23 ENCOUNTER — Ambulatory Visit

## 2023-11-23 DIAGNOSIS — M25561 Pain in right knee: Secondary | ICD-10-CM

## 2023-11-23 DIAGNOSIS — R600 Localized edema: Secondary | ICD-10-CM

## 2023-11-23 NOTE — Therapy (Signed)
 OUTPATIENT PHYSICAL THERAPY LOWER EXTREMITY TREATMENT    Patient Name: Tina Sampson MRN: [id number] DOB:1961/03/30, 63 y.o., female Today's Date: 11/23/2023  END OF SESSION:  PT End of Session - 11/23/23 1110     Visit Number 2    Number of Visits 12    Date for PT Re-Evaluation 12/29/23    PT Start Time 1100    PT Stop Time 1202    PT Time Calculation (min) 62 min    Activity Tolerance Patient tolerated treatment well    Behavior During Therapy [facility] for tasks assessed/performed             Past Medical History:  Diagnosis Date   Abdominal pain 10/15/2017   Diabetes mellitus    Diabetes mellitus, type II (HCC)    H/O knee surgery    Hyperlipidemia    IBS (irritable bowel syndrome)    Past Surgical History:  Procedure Laterality Date   ANKLE SURGERY Left    CARDIAC CATHETERIZATION     catherization     CERVICAL DISC SURGERY     CESAREAN SECTION     CHOLECYSTECTOMY     COLONOSCOPY WITH PROPOFOL  N/A 11/20/2021   Procedure: COLONOSCOPY WITH PROPOFOL ;  Surgeon: Urban Garden, MD;  [facility];  Service: Gastroenterology;  Laterality: N/A;  100   ESOPHAGEAL DILATION N/A 11/10/2023   Procedure: DILATION, ESOPHAGUS;  Surgeon: Urban Garden, MD;  [facility];  Service: Gastroenterology;  Laterality: N/A;  9:45AM;ASA 3   ESOPHAGOGASTRODUODENOSCOPY N/A 11/10/2023   Procedure: EGD (ESOPHAGOGASTRODUODENOSCOPY);  Surgeon: Umberto Ganong, Bearl Limes, MD;  [facility];  Service: Gastroenterology;  Laterality: N/A;  9:45AM;ASA 3   KNEE SURGERY     KNEE SURGERY     POLYPECTOMY  11/20/2021   Procedure: POLYPECTOMY;  Surgeon: Umberto Ganong, Bearl Limes, MD;  [facility];  Service: Gastroenterology;;   TUBAL LIGATION     Patient Active Problem List   Diagnosis Date Noted   Statin intolerance 10/21/2023   Gastroesophageal reflux disease 10/15/2023   Long-term (current) use of injectable non-insulin antidiabetic  drugs 03/23/2023   Irritable bowel syndrome with constipation 02/06/2022   Dysphagia 11/05/2021   Tubular adenoma of colon 11/04/2021   Abdominal pain, chronic, right lower quadrant 11/04/2021   Essential hypertension, benign 11/23/2018   Abdominal pain 10/15/2017   Uncontrolled type 2 diabetes mellitus with hyperglycemia (HCC) 06/10/2017   Mixed hyperlipidemia 06/10/2017   Morbid obesity (HCC) 06/10/2017    REFERRING PROVIDER: Anderson Kaufman MD  REFERRING DIAG: Primary arthritis of right knee.  THERAPY DIAG:  Acute pain of right knee  Localized edema  Rationale for Evaluation and Treatment: Rehabilitation  ONSET DATE: 07/2023.  SUBJECTIVE:   SUBJECTIVE STATEMENT:  The patient stated knee feels achy. Reported experiencing occasional shooting pain that wraps around knee to popliteal crease area. Pain is 8/10 at beginning of treatment.   PERTINENT HISTORY: See above.    PAIN:  Are you having pain? Yes: NPRS scale: 8/10 Pain location: Right knee.   Pain description: Ache.   Aggravating factors: Increased activity, walking, standing.   Relieving factors: 'Nothing."    PRECAUTIONS: Other: No increase therex.   WEIGHT BEARING RESTRICTIONS: No  FALLS:  Has patient fallen in last 6 months? Yes. Number of falls 2.    LIVING ENVIRONMENT: Lives with: lives with their spouse Lives in: House/apartment Stairs:  3 external steps.  Performing in a non-reciprocating fashion. Has following equipment at home: None  OCCUPATION:  Disabled.  PLOF: Independent  PATIENT GOALS: Get rid of knee pain and increase stability.     OBJECTIVE:  Note: Objective measures were completed at Evaluation unless otherwise noted.  DIAGNOSTIC FINDINGS: MRI:  Medial mensicus tear, Patello-femoral athrosis with grade 4 chondromalacia, moderate medial compartment chondrosis   PATIENT SURVEYS:  LEFS 41/80.    EDEMA:  Circumferential: right 2 cms > left.  PALPATION: Pain reported in  right medial popliteal fossa region.    LOWER EXTREMITY ROM:  WNL for right knee AROM.    LOWER EXTREMITY MMT: Patient performing a SLR without extensor lag and able to provide a normal strength grade for right knee extension.    LOWER EXTREMITY SPECIAL TESTS:  Normal right knee patellar mobility.    GAIT: Antalgic gait with some decrease in stance time over right LE.                                                                 TREATMENT:                                    11/23/23 EXERCISE LOG   Exercise Repetitions and Resistance Comments  Nustep   Level 3 10 minutes     Heel/toe raises   3 sets of 12 reps    LAQ  3 sets of 8 reps    Seated hip abduction  3 sets of 8 reps yellow bands   Seated march  3 sets of 12 reps   1 second holds  Seated hip adduction   2 sets of 12 reps         Blank cell = exercise not performed today   Manual Therapy   Joint Mobilizations: Right knee, grade 3 AP tibiofemoral joint mobilizations to reduce pain and increase mobility.    Upon completion of joint mobilizations, patient reported decrease pain and no adverse effects.   Modalities   Unattended Estim: Right knee, IFC 80-150 Hz on 40% scan, 15 mins, Pain Vaso: Right knee, 34 degrees, low pressure, 15 mins, Pain  Upon completion of estim and vaso modalities, patient reported decrease pain and no adverse effects.   11/17/23:   Vasopneumatic on low with IFC at 1-10 Hz on 40% scan x  20 minutes to patient's right knee.  Normal modality response following removal of modality.    PATIENT EDUCATION:  Education details: Disussed patient using a cane for fall prevention.  Discussed exercise progression and avoidance of activities that increase her pain. Person educated: Patient Education method: Explanation Education comprehension: verbalized understanding  HOME EXERCISE PROGRAM: [id number]   ASSESSMENT:  CLINICAL IMPRESSION:  The patient tolerated treatment well today. She was  provided verbal and tactile cueing for hip abduction, adduction and marching exercises. She was provided joint mobilizations, e-stem and vaso modalities and reported decreased pain and no adverse effects. She was provided and educated on HEP and demonstrated and verbalized compliance. She reported 3/10 pain at end of treatment session. Patient will continue to benefit from skilled physical therapy to address impairments and reduce pain while improving ability to perform ADL's.   OBJECTIVE IMPAIRMENTS: Abnormal gait, difficulty walking, increased edema, and pain.   ACTIVITY LIMITATIONS: carrying,  lifting, standing, stairs, and locomotion level  PARTICIPATION LIMITATIONS: meal prep, cleaning, laundry, and yard work  [facility] POTENTIAL: Good  CLINICAL DECISION MAKING: Evolving/moderate complexity  EVALUATION COMPLEXITY: Low   GOALS:  LONG TERM GOALS: Target date: 12/29/23.  Ind with a HEP.  Goal status: INITIAL  2.  Perform ADL's with right knee pain not > 3/10.  Goal status: INITIAL  3.  No complaints of right knee giving way.  Goal status: INITIAL  4.  Walk a community distance with pain not > 3/10.  Goal status: INITIAL   PLAN:  PT FREQUENCY: 2x/week  PT DURATION: 6 weeks  PLANNED INTERVENTIONS: 97110-Therapeutic exercises, 97530- Therapeutic activity, [id number]- Neuromuscular re-education, 97535- Self Care, 16109- Manual therapy, G0283- Electrical stimulation (unattended), 97016- Vasopneumatic device, 97035- Ultrasound, Patient/Family education, Taping, Cryotherapy, and Moist heat  PLAN FOR NEXT SESSION:  Nustep, continue hip, knee and lower leg muscle strengthening, joint mobilizations, modalities as needed.     Deryl Flora, PTA 11/23/2023, 12:40 PM

## 2023-11-25 ENCOUNTER — Ambulatory Visit: Admitting: Physical Therapy

## 2023-11-25 ENCOUNTER — Other Ambulatory Visit (HOSPITAL_COMMUNITY): Payer: Self-pay

## 2023-11-25 DIAGNOSIS — M25561 Pain in right knee: Secondary | ICD-10-CM | POA: Diagnosis not present

## 2023-11-25 DIAGNOSIS — R600 Localized edema: Secondary | ICD-10-CM

## 2023-11-25 NOTE — Therapy (Signed)
 OUTPATIENT PHYSICAL THERAPY LOWER EXTREMITY TREATMENT    Patient Name: Tina Sampson MRN: [id number] DOB:Dec 16, 1960, 63 y.o., female Today's Date: 11/25/2023  END OF SESSION:  PT End of Session - 11/25/23 1205     Visit Number 3    Number of Visits 12    Date for PT Re-Evaluation 12/29/23    PT Start Time 1100    PT Stop Time 1157    PT Time Calculation (min) 57 min    Activity Tolerance Patient tolerated treatment well    Behavior During Therapy [facility] for tasks assessed/performed              Past Medical History:  Diagnosis Date   Abdominal pain 10/15/2017   Diabetes mellitus    Diabetes mellitus, type II (HCC)    H/O knee surgery    Hyperlipidemia    IBS (irritable bowel syndrome)    Past Surgical History:  Procedure Laterality Date   ANKLE SURGERY Left    CARDIAC CATHETERIZATION     catherization     CERVICAL DISC SURGERY     CESAREAN SECTION     CHOLECYSTECTOMY     COLONOSCOPY WITH PROPOFOL  N/A 11/20/2021   Procedure: COLONOSCOPY WITH PROPOFOL ;  Surgeon: Urban Garden, MD;  [facility];  Service: Gastroenterology;  Laterality: N/A;  100   ESOPHAGEAL DILATION N/A 11/10/2023   Procedure: DILATION, ESOPHAGUS;  Surgeon: Urban Garden, MD;  [facility];  Service: Gastroenterology;  Laterality: N/A;  9:45AM;ASA 3   ESOPHAGOGASTRODUODENOSCOPY N/A 11/10/2023   Procedure: EGD (ESOPHAGOGASTRODUODENOSCOPY);  Surgeon: Umberto Ganong, Bearl Limes, MD;  [facility];  Service: Gastroenterology;  Laterality: N/A;  9:45AM;ASA 3   KNEE SURGERY     KNEE SURGERY     POLYPECTOMY  11/20/2021   Procedure: POLYPECTOMY;  Surgeon: Umberto Ganong, Bearl Limes, MD;  [facility];  Service: Gastroenterology;;   TUBAL LIGATION     Patient Active Problem List   Diagnosis Date Noted   Statin intolerance 10/21/2023   Gastroesophageal reflux disease 10/15/2023   Long-term (current) use of injectable non-insulin antidiabetic  drugs 03/23/2023   Irritable bowel syndrome with constipation 02/06/2022   Dysphagia 11/05/2021   Tubular adenoma of colon 11/04/2021   Abdominal pain, chronic, right lower quadrant 11/04/2021   Essential hypertension, benign 11/23/2018   Abdominal pain 10/15/2017   Uncontrolled type 2 diabetes mellitus with hyperglycemia (HCC) 06/10/2017   Mixed hyperlipidemia 06/10/2017   Morbid obesity (HCC) 06/10/2017    REFERRING PROVIDER: Anderson Kaufman MD  REFERRING DIAG: Primary arthritis of right knee.  THERAPY DIAG:  Acute pain of right knee  Localized edema  Rationale for Evaluation and Treatment: Rehabilitation  ONSET DATE: 07/2023.  SUBJECTIVE:   SUBJECTIVE STATEMENT:  Pain at a 5.  PERTINENT HISTORY: See above.    PAIN:  Are you having pain? Yes: NPRS scale: 5/10 Pain location: Right knee.   Pain description: Ache.   Aggravating factors: Increased activity, walking, standing.   Relieving factors: 'Nothing."    PRECAUTIONS: Other: No increase therex.   WEIGHT BEARING RESTRICTIONS: No  FALLS:  Has patient fallen in last 6 months? Yes. Number of falls 2.    LIVING ENVIRONMENT: Lives with: lives with their spouse Lives in: House/apartment Stairs:  3 external steps.  Performing in a non-reciprocating fashion. Has following equipment at home: None  OCCUPATION: Disabled.  PLOF: Independent  PATIENT GOALS: Get rid of knee pain and increase stability.     OBJECTIVE:  Note:  Objective measures were completed at Evaluation unless otherwise noted.  DIAGNOSTIC FINDINGS: MRI:  Medial mensicus tear, Patello-femoral athrosis with grade 4 chondromalacia, moderate medial compartment chondrosis   PATIENT SURVEYS:  LEFS 41/80.    EDEMA:  Circumferential: right 2 cms > left.  PALPATION: Pain reported in right medial popliteal fossa region.    LOWER EXTREMITY ROM:  WNL for right knee AROM.    LOWER EXTREMITY MMT: Patient performing a SLR without extensor lag  and able to provide a normal strength grade for right knee extension.    LOWER EXTREMITY SPECIAL TESTS:  Normal right knee patellar mobility.    GAIT: Antalgic gait with some decrease in stance time over right LE.                                                                 TREATMENT:   11/25/23:  Nustep level 3 x 10 minutes f/b STW/M x 13 minutes to patient's right with focus posteriorly f/b Vasopneumatic on low with IFC at 80-150 Hz on 40% scan x  20 minutes to patient's right knee.  Normal modality response following removal of modality.                                      11/23/23 EXERCISE LOG   Exercise Repetitions and Resistance Comments  Nustep   Level 3 10 minutes     Heel/toe raises   3 sets of 12 reps    LAQ  3 sets of 8 reps    Seated hip abduction  3 sets of 8 reps yellow bands   Seated march  3 sets of 12 reps   1 second holds  Seated hip adduction   2 sets of 12 reps         Blank cell = exercise not performed today   Manual Therapy   Joint Mobilizations: Right knee, grade 3 AP tibiofemoral joint mobilizations to reduce pain and increase mobility.    Upon completion of joint mobilizations, patient reported decrease pain and no adverse effects.   Modalities   Unattended Estim: Right knee, IFC 80-150 Hz on 40% scan, 15 mins, Pain Vaso: Right knee, 34 degrees, low pressure, 15 mins, Pain  Upon completion of estim and vaso modalities, patient reported decrease pain and no adverse effects.   11/17/23:   Vasopneumatic on low with IFC at 1-10 Hz on 40% scan x  20 minutes to patient's right knee.  Normal modality response following removal of modality.    PATIENT EDUCATION:  Education details: Disussed patient using a cane for fall prevention.  Discussed exercise progression and avoidance of activities that increase her pain. Person educated: Patient Education method: Explanation Education comprehension: verbalized understanding  HOME EXERCISE  PROGRAM: [id number]   ASSESSMENT:  CLINICAL IMPRESSION:  Patient continues to report significant right knee pain.  She states she was driving with her husband and had to let him drive as her knee hurt and felt numb.  She did well with treatment today and felt better following.     OBJECTIVE IMPAIRMENTS: Abnormal gait, difficulty walking, increased edema, and pain.   ACTIVITY LIMITATIONS: carrying, lifting, standing, stairs, and locomotion level  PARTICIPATION LIMITATIONS: meal prep, cleaning, laundry, and yard work  [facility] POTENTIAL: Good  CLINICAL DECISION MAKING: Evolving/moderate complexity  EVALUATION COMPLEXITY: Low   GOALS:  LONG TERM GOALS: Target date: 12/29/23.  Ind with a HEP.  Goal status: INITIAL  2.  Perform ADL's with right knee pain not > 3/10.  Goal status: INITIAL  3.  No complaints of right knee giving way.  Goal status: INITIAL  4.  Walk a community distance with pain not > 3/10.  Goal status: INITIAL   PLAN:  PT FREQUENCY: 2x/week  PT DURATION: 6 weeks  PLANNED INTERVENTIONS: 97110-Therapeutic exercises, 97530- Therapeutic activity, [id number]- Neuromuscular re-education, 97535- Self Care, 16109- Manual therapy, G0283- Electrical stimulation (unattended), 97016- Vasopneumatic device, 97035- Ultrasound, Patient/Family education, Taping, Cryotherapy, and Moist heat  PLAN FOR NEXT SESSION:  Nustep, continue hip, knee and lower leg muscle strengthening, joint mobilizations, modalities as needed.     Avah Bashor, [place], PT 11/25/2023, 12:06 PM

## 2023-11-27 ENCOUNTER — Other Ambulatory Visit (HOSPITAL_COMMUNITY): Payer: Self-pay

## 2023-11-30 ENCOUNTER — Ambulatory Visit: Admitting: Physical Therapy

## 2023-11-30 DIAGNOSIS — M25561 Pain in right knee: Secondary | ICD-10-CM | POA: Diagnosis not present

## 2023-11-30 DIAGNOSIS — R600 Localized edema: Secondary | ICD-10-CM

## 2023-11-30 NOTE — Therapy (Signed)
 OUTPATIENT PHYSICAL THERAPY LOWER EXTREMITY TREATMENT    Patient Name: Tina Sampson MRN: [id number] DOB:1961-01-17, 63 y.o., female Today's Date: 11/30/2023  END OF SESSION:  PT End of Session - 11/30/23 1720     Visit Number 4    Number of Visits 12    Date for PT Re-Evaluation 12/29/23    PT Start Time 0400    PT Stop Time 0458    PT Time Calculation (min) 58 min    Activity Tolerance Patient tolerated treatment well    Behavior During Therapy [facility] for tasks assessed/performed              Past Medical History:  Diagnosis Date   Abdominal pain 10/15/2017   Diabetes mellitus    Diabetes mellitus, type II (HCC)    H/O knee surgery    Hyperlipidemia    IBS (irritable bowel syndrome)    Past Surgical History:  Procedure Laterality Date   ANKLE SURGERY Left    CARDIAC CATHETERIZATION     catherization     CERVICAL DISC SURGERY     CESAREAN SECTION     CHOLECYSTECTOMY     COLONOSCOPY WITH PROPOFOL  N/A 11/20/2021   Procedure: COLONOSCOPY WITH PROPOFOL ;  Surgeon: Urban Garden, MD;  [facility];  Service: Gastroenterology;  Laterality: N/A;  100   ESOPHAGEAL DILATION N/A 11/10/2023   Procedure: DILATION, ESOPHAGUS;  Surgeon: Urban Garden, MD;  [facility];  Service: Gastroenterology;  Laterality: N/A;  9:45AM;ASA 3   ESOPHAGOGASTRODUODENOSCOPY N/A 11/10/2023   Procedure: EGD (ESOPHAGOGASTRODUODENOSCOPY);  Surgeon: Umberto Ganong, Bearl Limes, MD;  [facility];  Service: Gastroenterology;  Laterality: N/A;  9:45AM;ASA 3   KNEE SURGERY     KNEE SURGERY     POLYPECTOMY  11/20/2021   Procedure: POLYPECTOMY;  Surgeon: Umberto Ganong, Bearl Limes, MD;  [facility];  Service: Gastroenterology;;   TUBAL LIGATION     Patient Active Problem List   Diagnosis Date Noted   Statin intolerance 10/21/2023   Gastroesophageal reflux disease 10/15/2023   Long-term (current) use of injectable non-insulin antidiabetic  drugs 03/23/2023   Irritable bowel syndrome with constipation 02/06/2022   Dysphagia 11/05/2021   Tubular adenoma of colon 11/04/2021   Abdominal pain, chronic, right lower quadrant 11/04/2021   Essential hypertension, benign 11/23/2018   Abdominal pain 10/15/2017   Uncontrolled type 2 diabetes mellitus with hyperglycemia (HCC) 06/10/2017   Mixed hyperlipidemia 06/10/2017   Morbid obesity (HCC) 06/10/2017    REFERRING PROVIDER: Anderson Kaufman MD  REFERRING DIAG: Primary arthritis of right knee.  THERAPY DIAG:  Acute pain of right knee  Localized edema  Rationale for Evaluation and Treatment: Rehabilitation  ONSET DATE: 07/2023.  SUBJECTIVE:   SUBJECTIVE STATEMENT:  Not too bad today.  C/o right medial knee pain.    PERTINENT HISTORY: See above.    PAIN:  Are you having pain? Yes: NPRS scale: "Not too bad."/10 Pain location: Right knee.   Pain description: Ache.   Aggravating factors: Increased activity, walking, standing.   Relieving factors: 'Nothing."    PRECAUTIONS: Other: No increase therex.   WEIGHT BEARING RESTRICTIONS: No  FALLS:  Has patient fallen in last 6 months? Yes. Number of falls 2.    LIVING ENVIRONMENT: Lives with: lives with their spouse Lives in: House/apartment Stairs:  3 external steps.  Performing in a non-reciprocating fashion. Has following equipment at home: None  OCCUPATION: Disabled.  PLOF: Independent  PATIENT GOALS: Get rid of knee pain  and increase stability.     OBJECTIVE:  Note: Objective measures were completed at Evaluation unless otherwise noted.  DIAGNOSTIC FINDINGS: MRI:  Medial mensicus tear, Patello-femoral athrosis with grade 4 chondromalacia, moderate medial compartment chondrosis   PATIENT SURVEYS:  LEFS 41/80.    EDEMA:  Circumferential: right 2 cms > left.  PALPATION: Pain reported in right medial popliteal fossa region.    LOWER EXTREMITY ROM:  WNL for right knee AROM.    LOWER EXTREMITY  MMT: Patient performing a SLR without extensor lag and able to provide a normal strength grade for right knee extension.    LOWER EXTREMITY SPECIAL TESTS:  Normal right knee patellar mobility.    GAIT: Antalgic gait with some decrease in stance time over right LE.                                                                 TREATMENT:   11/30/23:  Recumbent bile on level 1 x 10 minutes f/b combo e'stim/US  at 1.50 w/CM2 x 8 minutes at 3.3 mHz at 20% x 8 minutes to patient's right medial knee f/b STW/M x  minutes f/b Vasopneumatic on low with IFC at 80-150 Hz on 40% scan x  20 minutes to patient's right knee.  Normal modality response following removal of modality.  11/25/23:  Nustep level 3 x 10 minutes f/b STW/M x 13 minutes to patient's right with focus posteriorly f/b Vasopneumatic on low with IFC at 80-150 Hz on 40% scan x  20 minutes to patient's right knee.  Normal modality response following removal of modality.    PATIENT EDUCATION:  Education details: Disussed patient using a cane for fall prevention.  Discussed exercise progression and avoidance of activities that increase her pain. Person educated: Patient Education method: Explanation Education comprehension: verbalized understanding  HOME EXERCISE PROGRAM: [id number]   ASSESSMENT:  CLINICAL IMPRESSION:  Patient CC was right medial knee pain and she had palpable joint line tenderness.  She did well on the recumbent bike at level 1 with no pain increase.   OBJECTIVE IMPAIRMENTS: Abnormal gait, difficulty walking, increased edema, and pain.   ACTIVITY LIMITATIONS: carrying, lifting, standing, stairs, and locomotion level  PARTICIPATION LIMITATIONS: meal prep, cleaning, laundry, and yard work  [facility] POTENTIAL: Good  CLINICAL DECISION MAKING: Evolving/moderate complexity  EVALUATION COMPLEXITY: Low   GOALS:  LONG TERM GOALS: Target date: 12/29/23.  Ind with a HEP.  Goal status: INITIAL  2.  Perform ADL's with  right knee pain not > 3/10.  Goal status: INITIAL  3.  No complaints of right knee giving way.  Goal status: INITIAL  4.  Walk a community distance with pain not > 3/10.  Goal status: INITIAL   PLAN:  PT FREQUENCY: 2x/week  PT DURATION: 6 weeks  PLANNED INTERVENTIONS: 97110-Therapeutic exercises, 97530- Therapeutic activity, [id number]- Neuromuscular re-education, 97535- Self Care, 13244- Manual therapy, [id number]- Electrical stimulation (unattended), 97016- Vasopneumatic device, 97035- Ultrasound, Patient/Family education, Taping, Cryotherapy, and Moist heat  PLAN FOR NEXT SESSION:  Nustep, continue hip, knee and lower leg muscle strengthening, joint mobilizations, modalities as needed.     Jabri Blancett, [place], PT 11/30/2023, 5:27 PM

## 2023-12-02 ENCOUNTER — Other Ambulatory Visit: Payer: Self-pay

## 2023-12-02 ENCOUNTER — Other Ambulatory Visit (HOSPITAL_COMMUNITY): Payer: Self-pay

## 2023-12-02 MED ORDER — GABAPENTIN 300 MG PO CAPS
ORAL_CAPSULE | ORAL | 10 refills | Status: AC
  Filled 2023-12-02: qty 720, 90d supply, fill #0

## 2023-12-02 MED ORDER — ONDANSETRON 4 MG PO TBDP
4.0000 mg | ORAL_TABLET | Freq: Three times a day (TID) | ORAL | 0 refills | Status: AC | PRN
  Filled 2023-12-02 – 2023-12-03 (×2): qty 20, 7d supply, fill #0

## 2023-12-03 ENCOUNTER — Other Ambulatory Visit (HOSPITAL_COMMUNITY): Payer: Self-pay

## 2023-12-03 ENCOUNTER — Encounter: Admitting: *Deleted

## 2023-12-03 HISTORY — PX: CARPAL TUNNEL RELEASE: SHX101

## 2023-12-07 ENCOUNTER — Encounter: Admitting: Physical Therapy

## 2023-12-08 ENCOUNTER — Other Ambulatory Visit (HOSPITAL_COMMUNITY): Payer: Self-pay

## 2023-12-09 ENCOUNTER — Encounter: Payer: Self-pay | Admitting: Pharmacist

## 2023-12-09 ENCOUNTER — Other Ambulatory Visit: Payer: Self-pay

## 2023-12-09 ENCOUNTER — Other Ambulatory Visit (HOSPITAL_COMMUNITY): Payer: Self-pay

## 2023-12-10 ENCOUNTER — Other Ambulatory Visit: Payer: Self-pay

## 2023-12-10 ENCOUNTER — Ambulatory Visit: Attending: Orthopaedic Surgery | Admitting: Physical Therapy

## 2023-12-10 ENCOUNTER — Other Ambulatory Visit (HOSPITAL_COMMUNITY): Payer: Self-pay

## 2023-12-10 DIAGNOSIS — E1165 Type 2 diabetes mellitus with hyperglycemia: Secondary | ICD-10-CM

## 2023-12-10 DIAGNOSIS — M25561 Pain in right knee: Secondary | ICD-10-CM | POA: Diagnosis present

## 2023-12-10 DIAGNOSIS — R600 Localized edema: Secondary | ICD-10-CM | POA: Insufficient documentation

## 2023-12-10 MED ORDER — ONETOUCH DELICA LANCETS 33G MISC
2 refills | Status: AC
  Filled 2023-12-10: qty 200, 100d supply, fill #0

## 2023-12-10 MED ORDER — BLOOD GLUCOSE MONITOR KIT
1.0000 | PACK | Freq: Every day | 0 refills | Status: AC | PRN
  Filled 2023-12-10: qty 1, 30d supply, fill #0

## 2023-12-10 MED ORDER — GLUCOSE BLOOD VI STRP
ORAL_STRIP | 2 refills | Status: DC
  Filled 2023-12-10: qty 200, fill #0

## 2023-12-10 NOTE — Therapy (Addendum)
 OUTPATIENT PHYSICAL THERAPY LOWER EXTREMITY TREATMENT    Patient Name: Tina Sampson MRN: [id number] DOB:1961-02-11, 63 y.o., female Today's Date: 12/10/2023  END OF SESSION:  PT End of Session - 12/10/23 1101     Visit Number 5    Number of Visits 12    Date for PT Re-Evaluation 12/29/23    PT Start Time 1015    PT Stop Time 1111    PT Time Calculation (min) 56 min    Activity Tolerance Patient tolerated treatment well    Behavior During Therapy [facility] for tasks assessed/performed               Past Medical History:  Diagnosis Date   Abdominal pain 10/15/2017   Diabetes mellitus    Diabetes mellitus, type II (HCC)    H/O knee surgery    Hyperlipidemia    IBS (irritable bowel syndrome)    Past Surgical History:  Procedure Laterality Date   ANKLE SURGERY Left    CARDIAC CATHETERIZATION     catherization     CERVICAL DISC SURGERY     CESAREAN SECTION     CHOLECYSTECTOMY     COLONOSCOPY WITH PROPOFOL  N/A 11/20/2021   Procedure: COLONOSCOPY WITH PROPOFOL ;  Surgeon: Eartha Angelia Sieving, MD;  [facility];  Service: Gastroenterology;  Laterality: N/A;  100   ESOPHAGEAL DILATION N/A 11/10/2023   Procedure: DILATION, ESOPHAGUS;  Surgeon: Eartha Angelia Sieving, MD;  [facility];  Service: Gastroenterology;  Laterality: N/A;  9:45AM;ASA 3   ESOPHAGOGASTRODUODENOSCOPY N/A 11/10/2023   Procedure: EGD (ESOPHAGOGASTRODUODENOSCOPY);  Surgeon: Eartha Angelia, Sieving, MD;  [facility];  Service: Gastroenterology;  Laterality: N/A;  9:45AM;ASA 3   KNEE SURGERY     KNEE SURGERY     POLYPECTOMY  11/20/2021   Procedure: POLYPECTOMY;  Surgeon: Eartha Angelia, Sieving, MD;  [facility];  Service: Gastroenterology;;   TUBAL LIGATION     Patient Active Problem List   Diagnosis Date Noted   Statin intolerance 10/21/2023   Gastroesophageal reflux disease 10/15/2023   Long-term (current) use of injectable non-insulin antidiabetic  drugs 03/23/2023   Irritable bowel syndrome with constipation 02/06/2022   Dysphagia 11/05/2021   Tubular adenoma of colon 11/04/2021   Abdominal pain, chronic, right lower quadrant 11/04/2021   Essential hypertension, benign 11/23/2018   Abdominal pain 10/15/2017   Uncontrolled type 2 diabetes mellitus with hyperglycemia (HCC) 06/10/2017   Mixed hyperlipidemia 06/10/2017   Morbid obesity (HCC) 06/10/2017    REFERRING PROVIDER: Lonni Reilly MD  REFERRING DIAG: Primary arthritis of right knee.  THERAPY DIAG:  Acute pain of right knee  Localized edema  Rationale for Evaluation and Treatment: Rehabilitation  ONSET DATE: 07/2023.  SUBJECTIVE:   SUBJECTIVE STATEMENT:  Pain about a 5.  PERTINENT HISTORY: See above.    PAIN:  Are you having pain? Yes: NPRS scale: 5/10 Pain location: Right knee.   Pain description: Ache.   Aggravating factors: Increased activity, walking, standing.   Relieving factors: 'Nothing.    PRECAUTIONS: Other: No increase therex.   WEIGHT BEARING RESTRICTIONS: No  FALLS:  Has patient fallen in last 6 months? Yes. Number of falls 2.    LIVING ENVIRONMENT: Lives with: lives with their spouse Lives in: House/apartment Stairs: 3 external steps.  Performing in a non-reciprocating fashion. Has following equipment at home: None  OCCUPATION: Disabled.  PLOF: Independent  PATIENT GOALS: Get rid of knee pain and increase stability.     OBJECTIVE:  Note:  Objective measures were completed at Evaluation unless otherwise noted.  DIAGNOSTIC FINDINGS: MRI:  Medial mensicus tear, Patello-femoral athrosis with grade 4 chondromalacia, moderate medial compartment chondrosis   PATIENT SURVEYS:  LEFS 41/80.    EDEMA:  Circumferential: right 2 cms > left.  PALPATION: Pain reported in right medial popliteal fossa region.    LOWER EXTREMITY ROM:  WNL for right knee AROM.    LOWER EXTREMITY MMT: Patient performing a SLR without extensor  lag and able to provide a normal strength grade for right knee extension.    LOWER EXTREMITY SPECIAL TESTS:  Normal right knee patellar mobility.    GAIT: Antalgic gait with some decrease in stance time over right LE.                                                                 TREATMENT:                                      EXERCISE LOG  Exercise Repetitions and Resistance Comments  Recumbent bike Level 2 x 15 minutes   Rockerboard In parallel bars x 4 minutes   LAQ's 3# x 4 minutes           STW/M x 5 minutes f/b f/b Vasopneumatic on low with IFC at 80-150 Hz on 40% scan x  15 minutes to patient's right knee.  Normal modality response following removal of modality.  11/30/23:  Recumbent bile on level 1 x 10 minutes f/b combo e'stim/US  at 1.50 w/CM2 x 8 minutes at 3.3 mHz at 20% x 8 minutes to patient's right medial knee f/b STW/M x  minutes f/b Vasopneumatic on low with IFC at 80-150 Hz on 40% scan x  20 minutes to patient's right knee.  Normal modality response following removal of modality.  PATIENT EDUCATION:  Education details: Disussed patient using a cane for fall prevention.  Discussed exercise progression and avoidance of activities that increase her pain. Person educated: Patient Education method: Explanation Education comprehension: verbalized understanding  HOME EXERCISE PROGRAM: [id number]   ASSESSMENT:  CLINICAL IMPRESSION:  Patient performed recumbent bike and therex without pain increase.  No goals met at this time.  OBJECTIVE IMPAIRMENTS: Abnormal gait, difficulty walking, increased edema, and pain.   ACTIVITY LIMITATIONS: carrying, lifting, standing, stairs, and locomotion level  PARTICIPATION LIMITATIONS: meal prep, cleaning, laundry, and yard work  [facility] POTENTIAL: Good  CLINICAL DECISION MAKING: Evolving/moderate complexity  EVALUATION COMPLEXITY: Low   GOALS:  LONG TERM GOALS: Target date: 12/29/23.  Ind with a HEP.  Goal status:  INITIAL  2.  Perform ADL's with right knee pain not > 3/10.  Goal status: INITIAL  3.  No complaints of right knee giving way.  Goal status: INITIAL  4.  Walk a community distance with pain not > 3/10.  Goal status: INITIAL   PLAN:  PT FREQUENCY: 2x/week  PT DURATION: 6 weeks  PLANNED INTERVENTIONS: 97110-Therapeutic exercises, 97530- Therapeutic activity, [id number]- Neuromuscular re-education, 97535- Self Care, [id number]- Manual therapy, G0283- Electrical stimulation (unattended), 97016- Vasopneumatic device, 97035- Ultrasound, Patient/Family education, Taping, Cryotherapy, and Moist heat  PLAN FOR NEXT SESSION:  Nustep, continue hip, knee and lower leg muscle strengthening,  joint mobilizations, modalities as needed.    PHYSICAL THERAPY DISCHARGE SUMMARY  Visits from Start of Care: 5.  Current functional level related to goals / functional outcomes: See above.   Remaining deficits: See below.   Education / Equipment: HEP.   Patient agrees to discharge. Patient goals were not met. Patient is being discharged due to not returning since the last visit.  Caliann Leckrone, [place], PT 12/10/2023, 11:15 AM

## 2023-12-11 ENCOUNTER — Other Ambulatory Visit (HOSPITAL_COMMUNITY): Payer: Self-pay

## 2023-12-14 ENCOUNTER — Other Ambulatory Visit: Payer: Self-pay

## 2023-12-14 ENCOUNTER — Other Ambulatory Visit (HOSPITAL_COMMUNITY): Payer: Self-pay

## 2023-12-14 DIAGNOSIS — E1165 Type 2 diabetes mellitus with hyperglycemia: Secondary | ICD-10-CM

## 2023-12-14 MED ORDER — GLUCOSE BLOOD VI STRP
1.0000 | ORAL_STRIP | Freq: Two times a day (BID) | 2 refills | Status: AC
  Filled 2023-12-14: qty 200, 100d supply, fill #0
  Filled 2023-12-15: qty 50, 25d supply, fill #0
  Filled 2024-01-18: qty 100, 50d supply, fill #1
  Filled 2024-02-20: qty 50, 25d supply, fill #1
  Filled 2024-03-05: qty 100, 50d supply, fill #1
  Filled 2024-03-10 – 2024-03-11 (×2): qty 50, 25d supply, fill #1

## 2023-12-15 ENCOUNTER — Other Ambulatory Visit (HOSPITAL_COMMUNITY): Payer: Self-pay

## 2023-12-15 ENCOUNTER — Ambulatory Visit: Admitting: *Deleted

## 2023-12-18 ENCOUNTER — Encounter (INDEPENDENT_AMBULATORY_CARE_PROVIDER_SITE_OTHER): Payer: Self-pay | Admitting: Gastroenterology

## 2023-12-18 ENCOUNTER — Ambulatory Visit: Admitting: *Deleted

## 2023-12-23 ENCOUNTER — Encounter: Admitting: *Deleted

## 2023-12-24 ENCOUNTER — Encounter

## 2024-01-19 ENCOUNTER — Other Ambulatory Visit: Payer: Self-pay

## 2024-02-22 ENCOUNTER — Other Ambulatory Visit (HOSPITAL_COMMUNITY): Payer: Self-pay

## 2024-02-23 ENCOUNTER — Other Ambulatory Visit (HOSPITAL_COMMUNITY): Payer: Self-pay

## 2024-02-23 LAB — LIPID PANEL
Chol/HDL Ratio: 4.1 ratio (ref 0.0–4.4)
Cholesterol, Total: 150 mg/dL (ref 100–199)
HDL: 37 mg/dL — ABNORMAL LOW (ref >39)
LDL Chol Calc (NIH): 80 mg/dL (ref 0–99)
Triglycerides: 198 mg/dL — ABNORMAL HIGH (ref 0–149)
VLDL Cholesterol Cal: 33 mg/dL (ref 5–40)

## 2024-02-23 LAB — COMPREHENSIVE METABOLIC PANEL WITH GFR
ALT: 19 IU/L (ref 0–32)
AST: 38 IU/L (ref 0–40)
Albumin: 4.3 g/dL (ref 3.9–4.9)
Alkaline Phosphatase: 99 IU/L (ref 44–121)
BUN/Creatinine Ratio: 10 — ABNORMAL LOW (ref 12–28)
BUN: 9 mg/dL (ref 8–27)
Bilirubin Total: 0.4 mg/dL (ref 0.0–1.2)
CO2: 21 mmol/L (ref 20–29)
Calcium: 9.6 mg/dL (ref 8.7–10.3)
Chloride: 106 mmol/L (ref 96–106)
Creatinine, Ser: 0.89 mg/dL (ref 0.57–1.00)
Globulin, Total: 2.3 g/dL (ref 1.5–4.5)
Glucose: 146 mg/dL — ABNORMAL HIGH (ref 70–99)
Potassium: 4.8 mmol/L (ref 3.5–5.2)
Sodium: 143 mmol/L (ref 134–144)
Total Protein: 6.6 g/dL (ref 6.0–8.5)
eGFR: 72 mL/min/1.73 (ref >59)

## 2024-02-24 ENCOUNTER — Other Ambulatory Visit (INDEPENDENT_AMBULATORY_CARE_PROVIDER_SITE_OTHER): Payer: Self-pay | Admitting: Gastroenterology

## 2024-03-02 ENCOUNTER — Encounter: Payer: Self-pay | Admitting: "Endocrinology

## 2024-03-02 ENCOUNTER — Ambulatory Visit: Admitting: "Endocrinology

## 2024-03-02 VITALS — BP 108/68 | HR 76 | Ht 68.0 in | Wt 238.4 lb

## 2024-03-02 DIAGNOSIS — I10 Essential (primary) hypertension: Secondary | ICD-10-CM

## 2024-03-02 DIAGNOSIS — E1165 Type 2 diabetes mellitus with hyperglycemia: Secondary | ICD-10-CM

## 2024-03-02 DIAGNOSIS — E782 Mixed hyperlipidemia: Secondary | ICD-10-CM | POA: Diagnosis not present

## 2024-03-02 DIAGNOSIS — Z7985 Long-term (current) use of injectable non-insulin antidiabetic drugs: Secondary | ICD-10-CM

## 2024-03-02 DIAGNOSIS — Z789 Other specified health status: Secondary | ICD-10-CM

## 2024-03-02 MED ORDER — EMPAGLIFLOZIN 25 MG PO TABS: 25.0000 mg | ORAL_TABLET | Freq: Every day | ORAL | 1 refills | Status: DC

## 2024-03-02 MED ORDER — TIRZEPATIDE 15 MG/0.5ML ~~LOC~~ SOAJ: 15.0000 mg | SUBCUTANEOUS | 1 refills | Status: DC

## 2024-03-02 NOTE — Progress Notes (Signed)
 03/02/2024, 12:50 PM                Endocrinology follow-up note  Subjective:    Patient ID: Tina Sampson, female    DOB: 1960-08-20.  Tina Sampson is being seen in follow-up in the management of currently uncontrolled symptomatic type 2 diabetes, hyperlipidemia, hypertension. PMD:  Corrington, Kip A, MD.   Past Medical History:  Diagnosis Date   Abdominal pain 10/15/2017   Diabetes mellitus    Diabetes mellitus, type II (HCC)    H/O knee surgery    Hyperlipidemia    IBS (irritable bowel syndrome)    Past Surgical History:  Procedure Laterality Date   ANKLE SURGERY Left    CARDIAC CATHETERIZATION     CARPAL TUNNEL RELEASE Left 12/2023   catherization     CERVICAL DISC SURGERY     CESAREAN SECTION     CHOLECYSTECTOMY     COLONOSCOPY WITH PROPOFOL  N/A 11/20/2021   Procedure: COLONOSCOPY WITH PROPOFOL ;  Surgeon: Eartha Angelia Sieving, MD;  [facility];  Service: Gastroenterology;  Laterality: N/A;  100   ESOPHAGEAL DILATION N/A 11/10/2023   Procedure: DILATION, ESOPHAGUS;  Surgeon: Eartha Angelia Sieving, MD;  Location: [facility];  Service: Gastroenterology;  Laterality: N/A;  9:45AM;ASA 3   ESOPHAGOGASTRODUODENOSCOPY N/A 11/10/2023   Procedure: EGD (ESOPHAGOGASTRODUODENOSCOPY);  Surgeon: Eartha Angelia, Sieving, MD;  [facility]: [facility];  Service: Gastroenterology;  Laterality: N/A;  9:45AM;ASA 3   KNEE SURGERY     KNEE SURGERY     POLYPECTOMY  11/20/2021   Procedure: POLYPECTOMY;  Surgeon: Eartha Angelia, Sieving, MD;  Location: [facility];  Service: Gastroenterology;;   TUBAL LIGATION     Social History   Socioeconomic History   Marital status: Married    Spouse name: Not on file   Number of children: Not on file   Years of education: Not on file   Highest education level: Not on file  Occupational History   Not on file  Tobacco Use   Smoking status: Never     Passive exposure: Current   Smokeless tobacco: Never  Vaping Use   Vaping status: Never Used  Substance and Sexual Activity   Alcohol use: No   Drug use: No   Sexual activity: Yes  Other Topics Concern   Not on file  Social History Narrative   Not on file   Social Drivers of Health   Financial Resource Strain: Low Risk  (12/02/2023)   Received from [facility]   Overall Financial Resource Strain (CARDIA)    Difficulty of Paying Living Expenses: Not very hard  Food Insecurity: No Food Insecurity (12/02/2023)   Received from [facility]   Hunger Vital Sign    Within the past 12 months, you worried that your food would run out before you got the money to buy more.: Never true    Within the past 12 months, the food you bought just didn't last and you didn't have money to get more.: Never true  Transportation Needs: No Transportation Needs (12/02/2023)   Received from [facility] - Transportation    Lack of Transportation (Medical): No    Lack of Transportation (Non-Medical): No  Physical  Activity: Unknown (12/02/2023)   Received from [facility]   Exercise Vital Sign    On average, how many days per week do you engage in moderate to strenuous exercise (like a brisk walk)?: 0 days    Minutes of Exercise per Session: Not on file  Stress: Stress Concern Present (12/02/2023)   Received from [facility] - Occupational Stress Questionnaire    Feeling of Stress : Rather much  Social Connections: Moderately Integrated (12/02/2023)   Received from [facility]   Social Network    How would you rate your social network (family, work, friends)?: Adequate participation with social networks   Outpatient Encounter Medications as of 03/02/2024  Medication Sig   tirzepatide  (MOUNJARO ) 15 MG/0.5ML Pen Inject 15 mg into the skin once a week.   albuterol (VENTOLIN HFA) 108 (90 Base) MCG/ACT inhaler Inhale 1-2 puffs into the lungs every 4  (four) hours as needed for wheezing or shortness of breath.   Alcohol Swabs (DROPSAFE ALCOHOL PREP) 70 % PADS USE BEFORE TESTING GLUCOSE AND INJECTING BYDUREON    ascorbic acid (VITAMIN C) 500 MG tablet Take 500 mg by mouth daily.   aspirin EC 81 MG tablet Take 81 mg daily by mouth.   B Complex Vitamins (VITAMIN B COMPLEX PO) Take 1 tablet by mouth daily.   blood glucose meter kit and supplies KIT Use to check blood glucose two times daily as directed.   Blood Glucose Monitoring Suppl (ACCU-CHEK GUIDE) w/Device KIT Use to test BG tid. DX e11.65   cholecalciferol (VITAMIN D3) 25 MCG (1000 UNIT) tablet Take 1,000 Units by mouth daily.   diphenhydramine-acetaminophen  (TYLENOL  PM) 25-500 MG TABS tablet Take 1 tablet by mouth at bedtime as needed (sleep/pain).   empagliflozin  (JARDIANCE ) 25 MG TABS tablet Take 1 tablet (25 mg total) by mouth daily.   EPINEPHrine 0.3 mg/0.3 mL IJ SOAJ injection Inject 0.3 mg into the muscle as needed for anaphylaxis.   gabapentin  (NEURONTIN ) 300 MG capsule Take 3 capsules (900 mg total) by mouth in the morning AND 3 capsules (900 mg total) every evening AND 1-2 capsules (300-600 mg total) at bedtime.   glucose blood test strip Use to check blood glucose twice daily as instructed.   hydrOXYzine  (ATARAX ) 25 MG tablet Take 1 tablet (25 mg total) by mouth 3 (three) times daily as needed for itching   hyoscyamine  (LEVSIN  SL) 0.125 MG SL tablet Place 1 tablet (0.125 mg total) under the tongue every 6 (six) hours as needed.   metFORMIN  (GLUCOPHAGE -XR) 500 MG 24 hr tablet Take 2 tablets (1,000 mg total) by mouth daily with breakfast.   Multiple Vitamin (MULTIVITAMIN) tablet Take 1 tablet daily by mouth.   ondansetron  (ZOFRAN -ODT) 4 MG disintegrating tablet Dissolve one tablet (4 mg dose) by mouth every 8 (eight) hours as needed for Nausea for up to 7 days.   OneTouch Delica Lancets 33G MISC Use  to check glucose twice daily as instructed   pantoprazole  (PROTONIX ) 40 MG tablet  TAKE ONE TABLET ONCE DAILY   pramipexole (MIRAPEX) 0.5 MG tablet Take 0.5 mg by mouth 3 (three) times daily. One tid For RLS   pravastatin  (PRAVACHOL ) 20 MG tablet Take 1 tablet (20 mg total) by mouth at bedtime.   propranolol (INDERAL) 10 MG tablet Take 10 mg by mouth 2 (two) times daily.   traZODone (DESYREL) 50 MG tablet Take 50 mg by mouth at bedtime.   venlafaxine  XR (EFFEXOR -XR) 150 MG 24 hr  capsule Take 1 capsule (150 mg total) by mouth in the morning.   [DISCONTINUED] empagliflozin  (JARDIANCE ) 10 MG TABS tablet Take 1 tablet (10 mg total) by mouth daily.   [DISCONTINUED] tirzepatide  (MOUNJARO ) 12.5 MG/0.5ML Pen Inject 12.5 mg into the skin once a week.   No facility-administered encounter medications on file as of 03/02/2024.    ALLERGIES: Allergies  Allergen Reactions   Bee Venom Shortness Of Breath, Itching and Swelling    VACCINATION STATUS: Immunization History  Administered Date(s) Administered   Influenza Split 05/07/2013   Moderna Sars-Covid-2 Vaccination 10/05/2019, 11/02/2019, 08/28/2020    Diabetes She presents for her follow-up diabetic visit. She has type 2 diabetes mellitus. Onset time: She was diagnosed at approximate age of 63 years. Her disease course has been improving. There are no hypoglycemic associated symptoms. Pertinent negatives for hypoglycemia include no confusion, headaches, pallor or seizures. Pertinent negatives for diabetes include no blurred vision, no chest pain, no polydipsia, no polyphagia, no polyuria and no weight loss. There are no hypoglycemic complications. Symptoms are improving. There are no diabetic complications. Risk factors for coronary artery disease include dyslipidemia, diabetes mellitus, obesity, sedentary lifestyle and post-menopausal. Her weight is decreasing steadily. She is following a generally unhealthy diet. When asked about meal planning, she reported none. She has not had a previous visit with a dietitian. She never  participates in exercise. Her home blood glucose trend is decreasing steadily. Her breakfast blood glucose range is generally 140-180 mg/dl. Her bedtime blood glucose range is generally 140-180 mg/dl. Her overall blood glucose range is 140-180 mg/dl. (She presents with improving glycemic profile and recent A1c of 7.6%.  She denies hypoglycemia.  She stays on Mounjaro , metformin , and Jardiance .    ) An ACE inhibitor/angiotensin II receptor blocker is being taken. She does not see a podiatrist.Eye exam is current (She has not seen her ophthalmologist in 2 years, I urged to reschedule a visit.).  Hyperlipidemia This is a chronic problem. The current episode started more than 1 year ago. The problem is controlled. Exacerbating diseases include diabetes and obesity. Pertinent negatives include no chest pain, myalgias or shortness of breath. Current antihyperlipidemic treatment includes statins. Risk factors for coronary artery disease include diabetes mellitus, dyslipidemia, obesity, a sedentary lifestyle and post-menopausal.     Review of systems  Constitutional: + Progressive weight loss,  current  Body mass index is 36.25 kg/m. , no fatigue, no subjective hyperthermia, no subjective hypothermia   Objective:    Pulse 76   Ht 5' 8 (1.727 m)   Wt 238 lb 6.4 oz (108.1 kg)   LMP 03/19/2011   BMI 36.25 kg/m   Wt Readings from Last 3 Encounters:  03/02/24 238 lb 6.4 oz (108.1 kg)  11/10/23 252 lb 10.4 oz (114.6 kg)  11/03/23 252 lb 9.6 oz (114.6 kg)    Physical Exam- Limited  Constitutional:  Body mass index is 36.25 kg/m. , not in acute distress, normal state of mind   Recent Results (from the past 2160 hours)  Comprehensive metabolic panel     Status: Abnormal   Collection Time: 02/22/24 11:01 AM  Result Value Ref Range   Glucose 146 (H) 70 - 99 mg/dL   BUN 9 8 - 27 mg/dL   Creatinine, Ser 9.10 0.57 - 1.00 mg/dL   eGFR 72 >40 fO/fpw/8.26   BUN/Creatinine Ratio 10 (L) 12 - 28    Sodium 143 134 - 144 mmol/L   Potassium 4.8 3.5 - 5.2 mmol/L   Chloride  106 96 - 106 mmol/L   CO2 21 20 - 29 mmol/L   Calcium 9.6 8.7 - 10.3 mg/dL   Total Protein 6.6 6.0 - 8.5 g/dL   Albumin 4.3 3.9 - 4.9 g/dL   Globulin, Total 2.3 1.5 - 4.5 g/dL   Bilirubin Total 0.4 0.0 - 1.2 mg/dL   Alkaline Phosphatase 99 44 - 121 IU/L   AST 38 0 - 40 IU/L   ALT 19 0 - 32 IU/L  Lipid panel     Status: Abnormal   Collection Time: 02/22/24 11:01 AM  Result Value Ref Range   Cholesterol, Total 150 100 - 199 mg/dL   Triglycerides 801 (H) 0 - 149 mg/dL   HDL 37 (L) >60 mg/dL   VLDL Cholesterol Cal 33 5 - 40 mg/dL   LDL Chol Calc (NIH) 80 0 - 99 mg/dL   Chol/HDL Ratio 4.1 0.0 - 4.4 ratio    Comment:                                   T. Chol/HDL Ratio                                             Men  Women                               1/2 Avg.Risk  3.4    3.3                                   Avg.Risk  5.0    4.4                                2X Avg.Risk  9.6    7.1                                3X Avg.Risk 23.4   11.0       Lipid Panel     Component Value Date/Time   CHOL 150 02/22/2024 1101   TRIG 198 (H) 02/22/2024 1101   HDL 37 (L) 02/22/2024 1101   CHOLHDL 4.1 02/22/2024 1101   CHOLHDL 3.7 01/13/2020 1112   LDLCALC 80 02/22/2024 1101   LDLCALC 71 01/13/2020 1112    Assessment & Plan:   1. Uncontrolled type 2 diabetes mellitus with hyperglycemia (HCC)  - Tina Sampson has currently uncontrolled symptomatic type 2 DM since  63 years of age.  She presents with improving glycemic profile and recent A1c of 7.6%.  She denies hypoglycemia.  She stays on Mounjaro , metformin , and Jardiance .    Recent labs reviewed.  -her diabetes is complicated by obesity/sedentary life and Tina Sampson remains at a high risk for more acute and chronic complications which include CAD, CVA, CKD, retinopathy, and neuropathy. These are all discussed in detail with the patient.  - she  acknowledges that there is a room for improvement in her food and drink choices. - Suggestion is made for her to avoid simple carbohydrates  from her diet including Cakes, Sweet Desserts,  Ice Cream, Soda (diet and regular), Sweet Tea, Candies, Chips, Cookies, Store Bought Juices, Alcohol , Artificial Sweeteners,  Coffee Creamer, and Sugar-free Products, Lemonade. This will help patient to have more stable blood glucose profile and potentially avoid unintended weight gain.  The following Lifestyle Medicine recommendations according to American College of Lifestyle Medicine  [facility]) were discussed and and offered to patient and she  agrees to start the journey:  A. Whole Foods, Plant-Based Nutrition comprising of fruits and vegetables, plant-based proteins, whole-grain carbohydrates was discussed in detail with the patient.   A list for source of those nutrients were also provided to the patient.  Patient will use only water  or unsweetened tea for hydration. B.  The need to stay away from risky substances including alcohol, smoking; obtaining 7 to 9 hours of restorative sleep, at least 150 minutes of moderate intensity exercise weekly, the importance of healthy social connections,  and stress management techniques were discussed. C.  A full color page of  Calorie density of various food groups per pound showing examples of each food groups was provided to the patient.  - I encouraged her to switch to  unprocessed or minimally processed complex starch and increased protein intake (animal or plant source), fruits, and vegetables.   - I have approached her with the following individualized plan to manage diabetes and patient agrees:   - She reports reasonable tolerance to her current dose of Mounjaro .  I discussed and maximized her Mounjaro  to 15 mg subcutaneously weekly. Side effects and precautions discussed with her.   - She is also tolerating her Jardiance , advised to increase Jardiance  to 25 mg p.o.  daily at breakfast along with metformin  1000 mg XR p.o. daily at breakfast.  She is willing and advised to continue monitoring blood glucose twice a day-daily before breakfast and at bedtime and encouraged to call clinic for hypoglycemia under 70 or hyperglycemia above 200 mg per DL.    2) BP/HTN -Her blood pressure is controlled to target.  She remains off of lisinopril .  Her blood pressure today is 108/72. Tina Sampson    3) Lipids/HPL:   Her most recent lipid panel showed significant improvement in her lipid panel with LDL at 80 improving from 117.   She seems to have tolerated pravastatin  better than her previous statins.  She is advised to continue pravastatin  20 mg p.o. nightly .  Side effects and precautions discussed with her.     4) weight management: Her current BMI is 36.25-presents with significant weight loss still clearly interferes in the attempt to control her diabetes.   She is a candidate for modest weight loss, not engaged with lifestyle medicine nutrition, and GLP-1 receptor agonists-see above.  She may benefit from continuous adjustment in the dose of her Mounjaro .   5) Chronic Care/Health Maintenance:  -she  is on  Statin medications and   lisinopril  is encouraged to continue to follow up with Ophthalmology, Dentist,  Podiatrist at least yearly or according to recommendations, and advised to  stay away from smoking. I have recommended yearly flu vaccine and pneumonia vaccination at least every 5 years; moderate intensity exercise for up to 150 minutes weekly; and  sleep for at least 7 hours a day.  Her screen ABI was abnormal in March 2022.  She was evaluated by vascular surgery with better testing showing no evidence of occlusive peripheral arterial disease.   - I advised patient to maintain close follow up with Corrington, Kip A, MD for  primary care needs.   I spent  40  minutes in the care of the patient today including review of labs from CMP, Lipids, Thyroid Function,  Hematology (current and previous including abstractions from other facilities); face-to-face time discussing  her blood glucose readings/logs, discussing hypoglycemia and hyperglycemia episodes and symptoms, medications doses, her options of short and long term treatment based on the latest standards of care / guidelines;  discussion about incorporating lifestyle medicine;  and documenting the encounter. Risk reduction counseling performed per USPSTF guidelines to reduce  obesity and cardiovascular risk factors.     Please refer to Patient Instructions for Blood Glucose Monitoring and Insulin/Medications Dosing Guide  in media tab for additional information. Please  also refer to  Patient Self Inventory in the Media  tab for reviewed elements of pertinent patient history.  Tina Sampson participated in the discussions, expressed understanding, and voiced agreement with the above plans.  All questions were answered to her satisfaction. she is encouraged to contact clinic should she have any questions or concerns prior to her return visit.   Follow up plan: - Return in about 4 months (around 07/03/2024) for Bring Meter/CGM Device/Logs- A1c in Office.  Tina Earl, MD [facility] [address] [place], [place] [address] Phone: [phone]  Fax: [phone]    03/02/2024, 12:50 PM  This note was partially dictated with voice recognition software. Similar sounding words can be transcribed inadequately or may not  be corrected upon review.

## 2024-03-02 NOTE — Patient Instructions (Signed)

## 2024-03-05 ENCOUNTER — Other Ambulatory Visit: Payer: Self-pay

## 2024-03-08 ENCOUNTER — Other Ambulatory Visit: Payer: Self-pay

## 2024-03-10 ENCOUNTER — Other Ambulatory Visit (HOSPITAL_COMMUNITY): Payer: Self-pay

## 2024-03-11 ENCOUNTER — Other Ambulatory Visit (HOSPITAL_COMMUNITY): Payer: Self-pay

## 2024-04-12 ENCOUNTER — Other Ambulatory Visit: Payer: Self-pay | Admitting: "Endocrinology

## 2024-04-12 DIAGNOSIS — E782 Mixed hyperlipidemia: Secondary | ICD-10-CM

## 2024-04-27 ENCOUNTER — Other Ambulatory Visit: Payer: Self-pay

## 2024-04-27 ENCOUNTER — Other Ambulatory Visit (HOSPITAL_COMMUNITY): Payer: Self-pay

## 2024-04-27 DIAGNOSIS — E1165 Type 2 diabetes mellitus with hyperglycemia: Secondary | ICD-10-CM

## 2024-04-27 MED ORDER — METFORMIN HCL ER 500 MG PO TB24: 1000.0000 mg | ORAL_TABLET | Freq: Every day | ORAL | 0 refills | Status: DC

## 2024-05-08 ENCOUNTER — Encounter (HOSPITAL_COMMUNITY): Payer: Self-pay

## 2024-05-08 ENCOUNTER — Emergency Department (HOSPITAL_COMMUNITY)
Admission: EM | Admit: 2024-05-08 | Discharge: 2024-05-08 | Disposition: A | Discharge status: Home / Self Care | Attending: Emergency Medicine | Admitting: Emergency Medicine

## 2024-05-08 ENCOUNTER — Emergency Department (HOSPITAL_COMMUNITY)

## 2024-05-08 ENCOUNTER — Other Ambulatory Visit: Payer: Self-pay

## 2024-05-08 DIAGNOSIS — N760 Acute vaginitis: Secondary | ICD-10-CM | POA: Diagnosis not present

## 2024-05-08 DIAGNOSIS — Z7982 Long term (current) use of aspirin: Secondary | ICD-10-CM | POA: Insufficient documentation

## 2024-05-08 DIAGNOSIS — S46911A Strain of unspecified muscle, fascia and tendon at shoulder and upper arm level, right arm, initial encounter: Secondary | ICD-10-CM | POA: Diagnosis not present

## 2024-05-08 DIAGNOSIS — Z7984 Long term (current) use of oral hypoglycemic drugs: Secondary | ICD-10-CM | POA: Insufficient documentation

## 2024-05-08 DIAGNOSIS — I10 Essential (primary) hypertension: Secondary | ICD-10-CM | POA: Diagnosis not present

## 2024-05-08 DIAGNOSIS — W010XXA Fall on same level from slipping, tripping and stumbling without subsequent striking against object, initial encounter: Secondary | ICD-10-CM | POA: Insufficient documentation

## 2024-05-08 DIAGNOSIS — E119 Type 2 diabetes mellitus without complications: Secondary | ICD-10-CM | POA: Diagnosis not present

## 2024-05-08 DIAGNOSIS — S4991XA Unspecified injury of right shoulder and upper arm, initial encounter: Secondary | ICD-10-CM | POA: Diagnosis present

## 2024-05-08 LAB — WET PREP, GENITAL
Clue Cells Wet Prep HPF POC: NONE SEEN
Sperm: NONE SEEN
Trich, Wet Prep: NONE SEEN
WBC, Wet Prep HPF POC: >=10 — AB (ref <10)
Yeast Wet Prep HPF POC: NONE SEEN

## 2024-05-08 MED ORDER — TERCONAZOLE 0.8 % VA CREA: 1.0000 | TOPICAL_CREAM | Freq: Every day | VAGINAL | 0 refills | Status: AC

## 2024-05-08 MED ORDER — NAPROXEN 500 MG PO TABS: 500.0000 mg | ORAL_TABLET | Freq: Two times a day (BID) | ORAL | 0 refills | Status: AC

## 2024-05-08 NOTE — ED Triage Notes (Signed)
 Pt reports she slipped and fell into her lawn tractor injuring her right upper arm.

## 2024-05-08 NOTE — Discharge Instructions (Addendum)
 Although your wet prep tonight is negative for yeast infection your symptoms and discharge suggest that this is yeast and I am covering you for this.  The cream should also be very soothing and help with your burning discomfort.  As discussed your x-ray is negative for fracture but it is possible you could have a tendon or ligament injury such as a rotator cuff injury.  You can wear the sling for comfort but make sure you are maintaining range of motion, do not rely on the sling 24/7.  I do recommend follow-up with your orthopedist with [facility] if your shoulder symptoms are not improving over the next 10 days.  Apply ice is much as is comfortable for the next 3 days, you may add a heating pad starting on day for 20 minutes 3-4 times daily.

## 2024-05-09 NOTE — ED Provider Notes (Signed)
 [facility] AT [facility] Provider Note   CSN: [id number] Arrival date & time: 05/08/24  1734     Patient presents with: Arm Injury   Tina Sampson is a 63 y.o. female with a history of type 2 diabetes, hypertension, GERD presenting with 2 complaints, the first being right shoulder pain after slipping in falling today landing with the right shoulder against her lawn tractor.  She states she already has a known rotator cuff tear of her left shoulder and today's injury is reminiscent of her left shoulder pain as well.  It is worsened with any movement, especially attempting forward or lateral flexion above shoulder height level.  She denies any other injuries, there is no weakness or numbness in the arm or hand.  She did not hit her head.  She also mentions that she has had some vaginal discharge with vaginal  and external itchy and irritating.  She has used an OTC vagisil vaginal cream which has not improved her symptoms.  She denies risk for STDs.  She denies dysuria, frequency, urgency, but describes external burning when she urinates.  She denies pelvic pain, fevers, nausea or vomiting.   The history is provided by the patient.       Prior to Admission medications   Medication Sig Start Date End Date Taking? Authorizing Provider  naproxen  (NAPROSYN ) 500 MG tablet Take 1 tablet (500 mg total) by mouth 2 (two) times daily. 05/08/24  Yes Shantanu Strauch, PA-C  terconazole (TERAZOL 3) 0.8 % vaginal cream Place 1 applicator vaginally at bedtime. 05/08/24  Yes Deserie Dirks, PA-C  albuterol (VENTOLIN HFA) 108 (90 Base) MCG/ACT inhaler Inhale 1-2 puffs into the lungs every 4 (four) hours as needed for wheezing or shortness of breath.    [provider]  Alcohol Swabs (DROPSAFE ALCOHOL PREP) 70 % PADS USE BEFORE TESTING GLUCOSE AND INJECTING BYDUREON  03/25/22   Nida, Gebreselassie W, MD  ascorbic acid (VITAMIN C) 500 MG tablet Take 500 mg by mouth daily.     [provider]  aspirin EC 81 MG tablet Take 81 mg daily by mouth.    [provider]  B Complex Vitamins (VITAMIN B COMPLEX PO) Take 1 tablet by mouth daily.    [provider]  blood glucose meter kit and supplies KIT Use to check blood glucose two times daily as directed. 12/10/23   Nida, Gebreselassie W, MD  Blood Glucose Monitoring Suppl (ACCU-CHEK GUIDE) w/Device KIT Use to test BG tid. DX e11.65 05/12/22   Nida, Gebreselassie W, MD  cholecalciferol (VITAMIN D3) 25 MCG (1000 UNIT) tablet Take 1,000 Units by mouth daily.    [provider]  diphenhydramine-acetaminophen  (TYLENOL  PM) 25-500 MG TABS tablet Take 1 tablet by mouth at bedtime as needed (sleep/pain).    [provider]  empagliflozin  (JARDIANCE ) 25 MG TABS tablet Take 1 tablet (25 mg total) by mouth daily. 03/02/24   Nida, Gebreselassie W, MD  EPINEPHrine 0.3 mg/0.3 mL IJ SOAJ injection Inject 0.3 mg into the muscle as needed for anaphylaxis.    [provider]  gabapentin  (NEURONTIN ) 300 MG capsule Take 3 capsules (900 mg total) by mouth in the morning AND 3 capsules (900 mg total) every evening AND 1-2 capsules (300-600 mg total) at bedtime. 12/02/23     glucose blood test strip Use to check blood glucose twice daily as instructed. 12/14/23   Nida, Gebreselassie W, MD  hydrOXYzine  (ATARAX ) 25 MG tablet Take 1 tablet (25  mg total) by mouth 3 (three) times daily as needed for itching 04/07/23   Suanne Pfeiffer, NP  hyoscyamine  (LEVSIN  SL) 0.125 MG SL tablet Place 1 tablet (0.125 mg total) under the tongue every 6 (six) hours as needed. 10/15/23   Carlan, Chelsea L, NP  metFORMIN  (GLUCOPHAGE -XR) 500 MG 24 hr tablet Take 2 tablets (1,000 mg total) by mouth daily with breakfast. 04/27/24   Nida, Gebreselassie W, MD  Multiple Vitamin (MULTIVITAMIN) tablet Take 1 tablet daily by mouth.    [provider]  ondansetron  (ZOFRAN -ODT) 4 MG disintegrating tablet Dissolve one tablet (4 mg  dose) by mouth every 8 (eight) hours as needed for Nausea for up to 7 days. 12/02/23     OneTouch Delica Lancets 33G MISC Use  to check glucose twice daily as instructed 12/10/23   Nida, Gebreselassie W, MD  pantoprazole  (PROTONIX ) 40 MG tablet TAKE ONE TABLET ONCE DAILY 02/24/24   Carlan, Chelsea L, NP  pramipexole (MIRAPEX) 0.5 MG tablet Take 0.5 mg by mouth 3 (three) times daily. One tid For RLS    [provider]  pravastatin  (PRAVACHOL ) 20 MG tablet TAKE 1 TABLET AT BEDTIME 04/14/24   Nida, Gebreselassie W, MD  propranolol (INDERAL) 10 MG tablet Take 10 mg by mouth 2 (two) times daily. 05/08/20   [provider]  tirzepatide  (MOUNJARO ) 15 MG/0.5ML Pen Inject 15 mg into the skin once a week. 03/02/24   Nida, Gebreselassie W, MD  traZODone (DESYREL) 50 MG tablet Take 50 mg by mouth at bedtime.    [provider]  venlafaxine  XR (EFFEXOR -XR) 150 MG 24 hr capsule Take 1 capsule (150 mg total) by mouth in the morning. 07/11/23   Corrington, Kip A, MD    Allergies: Bee venom    Review of Systems  Constitutional:  Negative for fever.  HENT:  Negative for congestion and sore throat.   Eyes: Negative.   Respiratory:  Negative for chest tightness and shortness of breath.   Cardiovascular:  Negative for chest pain.  Gastrointestinal:  Negative for abdominal pain and nausea.  Genitourinary:  Positive for vaginal discharge. Negative for frequency, hematuria and urgency.  Musculoskeletal:  Negative for arthralgias, joint swelling, myalgias and neck pain.  Skin: Negative.  Negative for rash and wound.  Neurological:  Negative for dizziness, weakness, light-headedness, numbness and headaches.  Psychiatric/Behavioral: Negative.      Updated Vital Signs BP 110/67   Pulse 88   Temp (!) 97.4 F (36.3 C) (Temporal)   Resp 16   Wt 104.3 kg   LMP 03/19/2011   SpO2 95%   BMI 34.97 kg/m   Physical Exam Exam conducted with a chaperone present.  Constitutional:      Appearance:  She is well-developed.  HENT:     Head: Atraumatic.  Cardiovascular:     Comments: Pulses equal bilaterally Genitourinary:    Comments: Creamy, somewhat clumpy dc obtained at introitus with cotton swab. Pt points out tender area right external labia which she states feels swollen.  No edema noted, but excorations present and skin irritation noted. No external lesions. Full speculum exam deferred. Musculoskeletal:        General: Tenderness present.     Right shoulder: Bony tenderness present. No swelling or deformity.     Right elbow: Normal.     Right wrist: Normal.     Cervical back: Normal range of motion.     Comments: Tender to palpation right anterior shoulder joint space.  There is no  crepitus with range of motion, pain worsens with both active and passive range of motion with anterior flexion and abduction.  Skin:    General: Skin is warm and dry.  Neurological:     Mental Status: She is alert.     Sensory: No sensory deficit.     Motor: No weakness.     Deep Tendon Reflexes: Reflexes normal.     (all labs ordered are listed, but only abnormal results are displayed) Labs Reviewed  WET PREP, GENITAL - Abnormal; Notable for the following components:      Result Value   WBC, Wet Prep HPF POC >=10 (*)    All other components within normal limits    EKG: None  Radiology: DG Humerus Right Result Date: 05/08/2024 CLINICAL DATA:  Pain after injury. EXAM: RIGHT HUMERUS - 2+ VIEW; RIGHT SHOULDER - 2+ VIEW COMPARISON:  Shoulder radiograph 08/30/2021 FINDINGS: Shoulder: No acute fracture or dislocation. Minor acromioclavicular degenerative spurring. No erosive change. Chronic spur versus soft tissue calcification adjacent to the superior humeral head. Humerus: No acute fracture of the humerus. No elbow dislocation. No focal bone abnormality. Unremarkable soft tissues. IMPRESSION: 1. No acute fracture or dislocation of the right shoulder or humerus. 2. Minor acromioclavicular  degenerative change. Electronically Signed   By: Andrea Gasman M.D.   On: 05/08/2024 18:26   DG Shoulder Right Result Date: 05/08/2024 CLINICAL DATA:  Pain after injury. EXAM: RIGHT HUMERUS - 2+ VIEW; RIGHT SHOULDER - 2+ VIEW COMPARISON:  Shoulder radiograph 08/30/2021 FINDINGS: Shoulder: No acute fracture or dislocation. Minor acromioclavicular degenerative spurring. No erosive change. Chronic spur versus soft tissue calcification adjacent to the superior humeral head. Humerus: No acute fracture of the humerus. No elbow dislocation. No focal bone abnormality. Unremarkable soft tissues. IMPRESSION: 1. No acute fracture or dislocation of the right shoulder or humerus. 2. Minor acromioclavicular degenerative change. Electronically Signed   By: Andrea Gasman M.D.   On: 05/08/2024 18:26     Procedures   Medications Ordered in the ED - No data to display                                  Medical Decision Making Patient with right shoulder pain after a fall against her yard tractor, direct blow, x-rays are reassuring.  She has no fractures or dislocations.  She has no neurodeficits on her exam.  Hopefully her injury contusion only, but advised she will need follow-up with orthopedics if this new injury does not heal within a reasonable time.  Discussed role of ice and heat, she was given a sling for comfort but we also discussed need for maintaining range of motion of the joint.  Naproxen  prescribed.  Patient's wet prep is surprisingly negative, her history and exam, the consistency of the discharge strongly suggest that this is a yeast infection.  I am going to cover her with Terazol, advised follow-up with her provider if symptoms are not improved with this treatment plan.  Amount and/or Complexity of Data Reviewed Labs: ordered.    Details: Wet prep is negative. Radiology: ordered.    Details: Right shoulder imaging reviewed, agree with interpretation no fracture.  AC  DJD.  Risk Prescription drug management.        Final diagnoses:  Strain of right shoulder, initial encounter  Acute vaginitis    ED Discharge Orders          Ordered  terconazole (TERAZOL 3) 0.8 % vaginal cream  Daily at bedtime        05/08/24 2037    naproxen  (NAPROSYN ) 500 MG tablet  2 times daily        05/08/24 2037               Saranda Legrande, PA-C 05/09/24 2252    Elnor Hila P, DO 05/10/24 1413

## 2024-05-18 ENCOUNTER — Encounter (INDEPENDENT_AMBULATORY_CARE_PROVIDER_SITE_OTHER): Payer: Self-pay | Admitting: Gastroenterology

## 2024-07-04 ENCOUNTER — Other Ambulatory Visit (INDEPENDENT_AMBULATORY_CARE_PROVIDER_SITE_OTHER): Payer: Self-pay | Admitting: Gastroenterology

## 2024-07-09 ENCOUNTER — Other Ambulatory Visit: Payer: Self-pay | Admitting: "Endocrinology

## 2024-07-09 DIAGNOSIS — E782 Mixed hyperlipidemia: Secondary | ICD-10-CM

## 2024-07-11 ENCOUNTER — Ambulatory Visit: Admitting: "Endocrinology

## 2024-07-12 ENCOUNTER — Ambulatory Visit: Admitting: "Endocrinology

## 2024-07-15 ENCOUNTER — Encounter: Payer: Self-pay | Admitting: "Endocrinology

## 2024-07-15 ENCOUNTER — Ambulatory Visit: Admitting: "Endocrinology

## 2024-07-15 VITALS — BP 116/74 | HR 84 | Ht 68.0 in | Wt 228.8 lb

## 2024-07-15 DIAGNOSIS — E782 Mixed hyperlipidemia: Secondary | ICD-10-CM

## 2024-07-15 DIAGNOSIS — Z6838 Body mass index (BMI) 38.0-38.9, adult: Secondary | ICD-10-CM

## 2024-07-15 DIAGNOSIS — E66812 Obesity, class 2: Secondary | ICD-10-CM | POA: Diagnosis not present

## 2024-07-15 DIAGNOSIS — E1165 Type 2 diabetes mellitus with hyperglycemia: Secondary | ICD-10-CM

## 2024-07-15 DIAGNOSIS — Z7985 Long-term (current) use of injectable non-insulin antidiabetic drugs: Secondary | ICD-10-CM

## 2024-07-15 DIAGNOSIS — I10 Essential (primary) hypertension: Secondary | ICD-10-CM | POA: Diagnosis not present

## 2024-07-15 NOTE — Patient Instructions (Signed)

## 2024-07-15 NOTE — Progress Notes (Signed)
 07/15/2024, 11:53 AM                Endocrinology follow-up note  Subjective:    Patient ID: Tina Sampson, female    DOB: 1960/11/03.  Tina Sampson is being seen in follow-up in the management of currently uncontrolled symptomatic type 2 diabetes, hyperlipidemia, hypertension. PMD:  Corrington, Kip A, MD.   Past Medical History:  Diagnosis Date   Abdominal pain 10/15/2017   Diabetes mellitus    Diabetes mellitus, type II (HCC)    H/O knee surgery    Hyperlipidemia    IBS (irritable bowel syndrome)    Past Surgical History:  Procedure Laterality Date   ANKLE SURGERY Left    CARDIAC CATHETERIZATION     CARPAL TUNNEL RELEASE Left 12/2023   catherization     CERVICAL DISC SURGERY     CESAREAN SECTION     CHOLECYSTECTOMY     COLONOSCOPY WITH PROPOFOL  N/A 11/20/2021   Procedure: COLONOSCOPY WITH PROPOFOL ;  Surgeon: Eartha Angelia Sieving, MD;  [facility];  Service: Gastroenterology;  Laterality: N/A;  100   ESOPHAGEAL DILATION N/A 11/10/2023   Procedure: DILATION, ESOPHAGUS;  Surgeon: Eartha Angelia Sieving, MD;  Location: [facility];  Service: Gastroenterology;  Laterality: N/A;  9:45AM;ASA 3   ESOPHAGOGASTRODUODENOSCOPY N/A 11/10/2023   Procedure: EGD (ESOPHAGOGASTRODUODENOSCOPY);  Surgeon: Eartha Angelia, Sieving, MD;  Location: [facility];  Service: Gastroenterology;  Laterality: N/A;  9:45AM;ASA 3   KNEE SURGERY     KNEE SURGERY     POLYPECTOMY  11/20/2021   Procedure: POLYPECTOMY;  Surgeon: Eartha Angelia, Sieving, MD;  Location: [facility];  Service: Gastroenterology;;   TUBAL LIGATION     Social History   Socioeconomic History   Marital status: Married    Spouse name: Not on file   Number of children: Not on file   Years of education: Not on file   Highest education level: Not on file  Occupational History   Not on file  Tobacco Use   Smoking status: Never     Passive exposure: Current   Smokeless tobacco: Never  Vaping Use   Vaping status: Never Used  Substance and Sexual Activity   Alcohol use: No   Drug use: No   Sexual activity: Yes  Other Topics Concern   Not on file  Social History Narrative   Not on file   Social Drivers of Health   Tobacco Use: Medium Risk (07/15/2024)   Patient History    Smoking Tobacco Use: Never    Smokeless Tobacco Use: Never    Passive Exposure: Current  Financial Resource Strain: Low Risk (12/02/2023)   Received from [facility]   Overall Financial Resource Strain (CARDIA)    Difficulty of Paying Living Expenses: Not very hard  Food Insecurity: No Food Insecurity (12/02/2023)   Received from [facility]   Epic    Within the past 12 months, you worried that your food would run out before you got the money to buy more.: Never true    Within the past 12 months, the food you bought just didn't last and you didn't have money to get more.: Never true  Transportation Needs: No Transportation Needs (12/02/2023)  Received from [facility]   PRAPARE - Transportation    Lack of Transportation (Medical): No    Lack of Transportation (Non-Medical): No  Physical Activity: Unknown (12/02/2023)   Received from [facility]   Exercise Vital Sign    On average, how many days per week do you engage in moderate to strenuous exercise (like a brisk walk)?: 0 days    Minutes of Exercise per Session: Not on file  Stress: Stress Concern Present (12/02/2023)   Received from [facility] - Occupational Stress Questionnaire    Feeling of Stress : Rather much  Social Connections: Moderately Integrated (12/02/2023)   Received from [facility]   Social Network    How would you rate your social network (family, work, friends)?: Adequate participation with social networks  Depression (PHQ2-9): Not on file  Alcohol Screen: Not on file  Housing: Low Risk (12/02/2023)   Received  from [facility]    In the last 12 months, was there a time when you were not able to pay the mortgage or rent on time?: No    In the past 12 months, how many times have you moved where you were living?: 0    At any time in the past 12 months, were you homeless or living in a shelter (including now)?: No  Utilities: Not At Risk (12/02/2023)   Received from [facility]    Threatened with loss of utilities: No  Health Literacy: Not on file   Outpatient Encounter Medications as of 07/15/2024  Medication Sig   albuterol (VENTOLIN HFA) 108 (90 Base) MCG/ACT inhaler Inhale 1-2 puffs into the lungs every 4 (four) hours as needed for wheezing or shortness of breath.   Alcohol Swabs (DROPSAFE ALCOHOL PREP) 70 % PADS USE BEFORE TESTING GLUCOSE AND INJECTING BYDUREON    ascorbic acid (VITAMIN C) 500 MG tablet Take 500 mg by mouth daily.   aspirin EC 81 MG tablet Take 81 mg daily by mouth.   B Complex Vitamins (VITAMIN B COMPLEX PO) Take 1 tablet by mouth daily.   blood glucose meter kit and supplies KIT Use to check blood glucose two times daily as directed.   Blood Glucose Monitoring Suppl (ACCU-CHEK GUIDE) w/Device KIT Use to test BG tid. DX e11.65   cholecalciferol (VITAMIN D3) 25 MCG (1000 UNIT) tablet Take 1,000 Units by mouth daily.   diphenhydramine-acetaminophen  (TYLENOL  PM) 25-500 MG TABS tablet Take 1 tablet by mouth at bedtime as needed (sleep/pain).   empagliflozin  (JARDIANCE ) 25 MG TABS tablet Take 1 tablet (25 mg total) by mouth daily.   EPINEPHrine 0.3 mg/0.3 mL IJ SOAJ injection Inject 0.3 mg into the muscle as needed for anaphylaxis.   gabapentin  (NEURONTIN ) 300 MG capsule Take 3 capsules (900 mg total) by mouth in the morning AND 3 capsules (900 mg total) every evening AND 1-2 capsules (300-600 mg total) at bedtime.   glucose blood test strip Use to check blood glucose twice daily as instructed.   hydrOXYzine  (ATARAX ) 25 MG tablet Take 1 tablet (25 mg total)  by mouth 3 (three) times daily as needed for itching   hyoscyamine  (LEVSIN  SL) 0.125 MG SL tablet Place 1 tablet (0.125 mg total) under the tongue every 6 (six) hours as needed.   Multiple Vitamin (MULTIVITAMIN) tablet Take 1 tablet daily by mouth.   naproxen  (NAPROSYN ) 500 MG tablet Take 1 tablet (500 mg total) by mouth 2 (two) times  daily.   ondansetron  (ZOFRAN -ODT) 4 MG disintegrating tablet Dissolve one tablet (4 mg dose) by mouth every 8 (eight) hours as needed for Nausea for up to 7 days.   OneTouch Delica Lancets 33G MISC Use  to check glucose twice daily as instructed   pantoprazole  (PROTONIX ) 40 MG tablet TAKE ONE TABLET ONCE DAILY   pramipexole (MIRAPEX) 0.5 MG tablet Take 0.5 mg by mouth 3 (three) times daily. One tid For RLS   pravastatin  (PRAVACHOL ) 20 MG tablet TAKE 1 TABLET AT BEDTIME   propranolol (INDERAL) 10 MG tablet Take 10 mg by mouth 2 (two) times daily.   terconazole  (TERAZOL 3 ) 0.8 % vaginal cream Place 1 applicator vaginally at bedtime.   tirzepatide  (MOUNJARO ) 15 MG/0.5ML Pen Inject 15 mg into the skin once a week.   traZODone (DESYREL) 50 MG tablet Take 50 mg by mouth at bedtime.   venlafaxine  XR (EFFEXOR -XR) 150 MG 24 hr capsule Take 1 capsule (150 mg total) by mouth in the morning.   [DISCONTINUED] metFORMIN  (GLUCOPHAGE -XR) 500 MG 24 hr tablet Take 2 tablets (1,000 mg total) by mouth daily with breakfast.   No facility-administered encounter medications on file as of 07/15/2024.    ALLERGIES: Allergies  Allergen Reactions   Bee Venom Shortness Of Breath, Itching and Swelling    VACCINATION STATUS: Immunization History  Administered Date(s) Administered   Influenza Split 05/07/2013   Moderna Sars-Covid-2 Vaccination 10/05/2019, 11/02/2019, 08/28/2020    Diabetes She presents for her follow-up diabetic visit. She has type 2 diabetes mellitus. Onset time: She was diagnosed at approximate age of 48 years. Her disease course has been improving. There are no  hypoglycemic associated symptoms. Pertinent negatives for hypoglycemia include no confusion, headaches, pallor or seizures. Pertinent negatives for diabetes include no blurred vision, no chest pain, no polydipsia, no polyphagia, no polyuria and no weight loss. There are no hypoglycemic complications. Symptoms are improving. There are no diabetic complications. Risk factors for coronary artery disease include dyslipidemia, diabetes mellitus, obesity, sedentary lifestyle and post-menopausal. Her weight is decreasing steadily. She is following a generally unhealthy diet. When asked about meal planning, she reported none. She has not had a previous visit with a dietitian. She never participates in exercise. Her home blood glucose trend is decreasing steadily. Her breakfast blood glucose range is generally 130-140 mg/dl. Her bedtime blood glucose range is generally 130-140 mg/dl. Her overall blood glucose range is 130-140 mg/dl. (She presents with continued improvement in her glycemic profile.  Her previsit labs show A1c of 6.5% improving from 7.6%.  She presents with 24 pounds of weight loss overall.    ) An ACE inhibitor/angiotensin II receptor blocker is being taken. She does not see a podiatrist.Eye exam is current (She has not seen her ophthalmologist in 2 years, I urged to reschedule a visit.).  Hyperlipidemia This is a chronic problem. The current episode started more than 1 year ago. The problem is controlled. Exacerbating diseases include diabetes and obesity. Pertinent negatives include no chest pain, myalgias or shortness of breath. Current antihyperlipidemic treatment includes statins. Risk factors for coronary artery disease include diabetes mellitus, dyslipidemia, obesity, a sedentary lifestyle and post-menopausal.   Review of systems  Constitutional: + Progressive weight loss,  current  Body mass index is 34.79 kg/m. , no fatigue, no subjective hyperthermia, no subjective  hypothermia   Objective:    BP 116/74   Pulse 84   Ht 5' 8 (1.727 m)   Wt 228 lb 12.8 oz (103.8 kg)  LMP 03/19/2011   BMI 34.79 kg/m   Wt Readings from Last 3 Encounters:  07/15/24 228 lb 12.8 oz (103.8 kg)  05/08/24 230 lb (104.3 kg)  03/02/24 238 lb 6.4 oz (108.1 kg)    Physical Exam- Limited  Constitutional:  Body mass index is 34.79 kg/m. , not in acute distress, normal state of mind   Recent Results (from the past 2160 hours)  Wet prep, genital     Status: Abnormal   Collection Time: 05/08/24  7:39 PM  Result Value Ref Range   Yeast Wet Prep HPF POC NONE SEEN NONE SEEN   Trich, Wet Prep NONE SEEN NONE SEEN   Clue Cells Wet Prep HPF POC NONE SEEN NONE SEEN   WBC, Wet Prep HPF POC >=10 (A) <10   Sperm NONE SEEN     Comment: Performed at [facility], [address] [place], [place] [address]      Lipid Panel     Component Value Date/Time   CHOL 150 02/22/2024 1101   TRIG 198 (H) 02/22/2024 1101   HDL 37 (L) 02/22/2024 1101   CHOLHDL 4.1 02/22/2024 1101   CHOLHDL 3.7 01/13/2020 1112   LDLCALC 80 02/22/2024 1101   LDLCALC 71 01/13/2020 1112    Assessment & Plan:   1. Uncontrolled type 2 diabetes mellitus with hyperglycemia (HCC)  - Tina Sampson has currently uncontrolled symptomatic type 2 DM since  63 years of age.  She presents with continued improvement in her glycemic profile.  Her previsit labs show A1c of 6.5% improving from 7.6%.  She presents with 24 pounds of weight loss overall.     Recent labs reviewed.  -her diabetes is complicated by obesity/sedentary life and Tina Sampson remains at a high risk for more acute and chronic complications which include CAD, CVA, CKD, retinopathy, and neuropathy. These are all discussed in detail with the patient.  - she acknowledges that there is a room for improvement in her food and drink choices. - Suggestion is made for her to avoid simple carbohydrates  from her diet including Cakes, Sweet  Desserts, Ice Cream, Soda (diet and regular), Sweet Tea, Candies, Chips, Cookies, Store Bought Juices, Alcohol , Artificial Sweeteners,  Coffee Creamer, and Sugar-free Products, Lemonade. This will help patient to have more stable blood glucose profile and potentially avoid unintended weight gain.  The following Lifestyle Medicine recommendations according to American College of Lifestyle Medicine  [facility]) were discussed and and offered to patient and she  agrees to start the journey:  A. Whole Foods, Plant-Based Nutrition comprising of fruits and vegetables, plant-based proteins, whole-grain carbohydrates was discussed in detail with the patient.   A list for source of those nutrients were also provided to the patient.  Patient will use only water  or unsweetened tea for hydration. B.  The need to stay away from risky substances including alcohol, smoking; obtaining 7 to 9 hours of restorative sleep, at least 150 minutes of moderate intensity exercise weekly, the importance of healthy social connections,  and stress management techniques were discussed. C.  A full color page of  Calorie density of various food groups per pound showing examples of each food groups was provided to the patient.    - I encouraged her to switch to  unprocessed or minimally processed complex starch and increased protein intake (animal or plant source), fruits, and vegetables.   - I have approached her with the following individualized plan to manage diabetes and patient agrees:   -  She reports reasonable tolerance to her current dose of Mounjaro .  She is advised to continue Mounjaro  15 mg subcutaneously weekly.  Side effects and precautions discussed with her.   - She is also tolerating and benefiting from Jardiance , advised to continue Jardiance  25 mg daily.   she is advised to discontinue metformin .     2) BP/HTN - Her blood pressure is controlled.  She remains off of lisinopril .  Her blood pressure today is 108/72.  Tina Sampson    3) Lipids/HPL:   Her most recent lipid panel showed significant improvement in her lipid panel with LDL at 80 improving from 117.  She is tolerating pravastatin .  She is  advised  to continue pravastatin  20 mg p.o. daily. Side effects and precautions discussed with her.     4) weight management: Her current BMI is 34.79 kg/m-presents with significant weight loss still clearly interferes in the attempt to control her diabetes.   She is a candidate for modest weight loss, not engaged with lifestyle medicine nutrition, and GLP-1 receptor agonists-see above.  She may benefit from continuous adjustment in the dose of her Mounjaro .   5) Chronic Care/Health Maintenance:  -she  is on  Statin medications and   lisinopril  is encouraged to continue to follow up with Ophthalmology, Dentist,  Podiatrist at least yearly or according to recommendations, and advised to  stay away from smoking. I have recommended yearly flu vaccine and pneumonia vaccination at least every 5 years; moderate intensity exercise for up to 150 minutes weekly; and  sleep for at least 7 hours a day.  Her screen ABI was abnormal in March 2022.  She was evaluated by vascular surgery with better testing showing no evidence of occlusive peripheral arterial disease.   - I advised patient to maintain close follow up with Corrington, Kip A, MD for primary care needs.   I spent  26  minutes in the care of the patient today including review of labs from CMP, Lipids, Thyroid Function, Hematology (current and previous including abstractions from other facilities); face-to-face time discussing  her blood glucose readings/logs, discussing hypoglycemia and hyperglycemia episodes and symptoms, medications doses, her options of short and long term treatment based on the latest standards of care / guidelines;  discussion about incorporating lifestyle medicine;  and documenting the encounter. Risk reduction counseling performed per USPSTF guidelines  to reduce  obesity and cardiovascular risk factors.     Please refer to Patient Instructions for Blood Glucose Monitoring and Insulin/Medications Dosing Guide  in media tab for additional information. Please  also refer to  Patient Self Inventory in the Media  tab for reviewed elements of pertinent patient history.  Tina Sampson participated in the discussions, expressed understanding, and voiced agreement with the above plans.  All questions were answered to her satisfaction. she is encouraged to contact clinic should she have any questions or concerns prior to her return visit.   Follow up plan: - Return in about 4 months (around 11/13/2024) for F/U with Pre-visit Labs, A1c -[place].  Ranny Earl, MD [facility] [address] [place], [place] [address] Phone: [phone]  Fax: [phone]    07/15/2024, 11:53 AM  This note was partially dictated with voice recognition software. Similar sounding words can be transcribed inadequately or may not  be corrected upon review.

## 2024-08-30 ENCOUNTER — Ambulatory Visit (INDEPENDENT_AMBULATORY_CARE_PROVIDER_SITE_OTHER): Admitting: Gastroenterology

## 2024-09-09 ENCOUNTER — Other Ambulatory Visit: Payer: Self-pay | Admitting: "Endocrinology

## 2024-09-09 DIAGNOSIS — E1165 Type 2 diabetes mellitus with hyperglycemia: Secondary | ICD-10-CM

## 2024-09-29 ENCOUNTER — Encounter (INDEPENDENT_AMBULATORY_CARE_PROVIDER_SITE_OTHER): Admitting: Gastroenterology

## 2024-11-14 ENCOUNTER — Ambulatory Visit: Admitting: "Endocrinology
